# Patient Record
Sex: Male | Born: 1940
Health system: Southern US, Community
[De-identification: ages and names within clinical notes are randomized; demographics above are authoritative.]

## PROBLEM LIST (undated history)

## (undated) DIAGNOSIS — M1990 Unspecified osteoarthritis, unspecified site: Secondary | ICD-10-CM

## (undated) DIAGNOSIS — E785 Hyperlipidemia, unspecified: Secondary | ICD-10-CM

## (undated) DIAGNOSIS — I10 Essential (primary) hypertension: Secondary | ICD-10-CM

## (undated) DIAGNOSIS — C801 Malignant (primary) neoplasm, unspecified: Secondary | ICD-10-CM

## (undated) DIAGNOSIS — T7840XA Allergy, unspecified, initial encounter: Secondary | ICD-10-CM

## (undated) DIAGNOSIS — Z86010 Personal history of colonic polyps: Secondary | ICD-10-CM

## (undated) HISTORY — PX: HERNIA REPAIR: SHX51

## (undated) HISTORY — DX: Unspecified osteoarthritis, unspecified site: M19.90

## (undated) HISTORY — PX: CARDIAC CATHETERIZATION: SHX172

## (undated) HISTORY — DX: Allergy, unspecified, initial encounter: T78.40XA

## (undated) HISTORY — PX: APPENDECTOMY: SHX54

## (undated) HISTORY — DX: Malignant (primary) neoplasm, unspecified: C80.1

## (undated) HISTORY — DX: Hyperlipidemia, unspecified: E78.5

## (undated) HISTORY — DX: Personal history of colonic polyps: Z86.010

## (undated) HISTORY — PX: COLON SURGERY: SHX602

---

## 1982-02-26 HISTORY — PX: MELANOMA EXCISION: SHX5266

## 2006-08-28 ENCOUNTER — Other Ambulatory Visit: Admission: RE | Admit: 2006-08-28 | Discharge: 2006-08-28 | Payer: Self-pay | Admitting: Interventional Radiology

## 2006-08-28 ENCOUNTER — Encounter: Admission: RE | Admit: 2006-08-28 | Discharge: 2006-08-28 | Payer: Self-pay | Admitting: Family Medicine

## 2006-08-28 ENCOUNTER — Encounter (INDEPENDENT_AMBULATORY_CARE_PROVIDER_SITE_OTHER): Payer: Self-pay | Admitting: Interventional Radiology

## 2007-09-17 ENCOUNTER — Ambulatory Visit: Payer: Self-pay | Admitting: Internal Medicine

## 2007-09-29 ENCOUNTER — Encounter: Payer: Self-pay | Admitting: Internal Medicine

## 2007-09-29 ENCOUNTER — Ambulatory Visit: Payer: Self-pay | Admitting: Internal Medicine

## 2007-10-03 ENCOUNTER — Encounter: Payer: Self-pay | Admitting: Internal Medicine

## 2008-01-02 ENCOUNTER — Telehealth: Payer: Self-pay | Admitting: Internal Medicine

## 2008-01-26 ENCOUNTER — Ambulatory Visit: Payer: Self-pay | Admitting: Internal Medicine

## 2008-02-02 ENCOUNTER — Ambulatory Visit (HOSPITAL_COMMUNITY): Admission: RE | Admit: 2008-02-02 | Discharge: 2008-02-02 | Payer: Self-pay | Admitting: Internal Medicine

## 2008-02-02 ENCOUNTER — Ambulatory Visit: Payer: Self-pay | Admitting: Internal Medicine

## 2008-02-02 ENCOUNTER — Encounter: Payer: Self-pay | Admitting: Internal Medicine

## 2008-02-09 ENCOUNTER — Encounter: Payer: Self-pay | Admitting: Internal Medicine

## 2011-03-23 ENCOUNTER — Ambulatory Visit (INDEPENDENT_AMBULATORY_CARE_PROVIDER_SITE_OTHER): Payer: Medicare Other | Admitting: Family Medicine

## 2011-03-23 DIAGNOSIS — E782 Mixed hyperlipidemia: Secondary | ICD-10-CM

## 2011-03-23 DIAGNOSIS — I10 Essential (primary) hypertension: Secondary | ICD-10-CM

## 2011-03-23 DIAGNOSIS — R7989 Other specified abnormal findings of blood chemistry: Secondary | ICD-10-CM

## 2011-03-25 ENCOUNTER — Ambulatory Visit (INDEPENDENT_AMBULATORY_CARE_PROVIDER_SITE_OTHER): Payer: Medicare Other

## 2011-03-25 DIAGNOSIS — E789 Disorder of lipoprotein metabolism, unspecified: Secondary | ICD-10-CM

## 2011-03-25 DIAGNOSIS — I10 Essential (primary) hypertension: Secondary | ICD-10-CM

## 2011-11-13 ENCOUNTER — Encounter: Payer: Self-pay | Admitting: Internal Medicine

## 2011-12-05 ENCOUNTER — Telehealth: Payer: Self-pay

## 2011-12-05 NOTE — Telephone Encounter (Signed)
Pt is out of his htn meds, I have scheduled an appt with Dr. Neva Seat for 10/28. Can we call in enough to cover him thru this appt date? He is working at [employer] so he cannot come in before the 28th.  [facility] [address]  [phone]

## 2011-12-06 NOTE — Telephone Encounter (Signed)
Have left message for him to advise yes, we can send this in for him, to have pharmacy send the request.

## 2011-12-11 ENCOUNTER — Other Ambulatory Visit: Payer: Self-pay | Admitting: Radiology

## 2011-12-11 MED ORDER — HYDROCHLOROTHIAZIDE 12.5 MG PO TABS: 12.5000 mg | ORAL_TABLET | Freq: Every day | ORAL | Status: DC

## 2011-12-18 NOTE — Telephone Encounter (Signed)
Rx was sent in on 12/11/11.

## 2011-12-18 NOTE — Telephone Encounter (Signed)
Encounter is still open, has RX been done?

## 2011-12-24 ENCOUNTER — Ambulatory Visit (INDEPENDENT_AMBULATORY_CARE_PROVIDER_SITE_OTHER): Payer: Medicare Other | Admitting: Family Medicine

## 2011-12-24 ENCOUNTER — Encounter: Payer: Self-pay | Admitting: Family Medicine

## 2011-12-24 VITALS — BP 140/76 | HR 77 | Temp 97.5°F | Resp 16 | Ht 69.0 in | Wt 204.0 lb

## 2011-12-24 DIAGNOSIS — R739 Hyperglycemia, unspecified: Secondary | ICD-10-CM

## 2011-12-24 DIAGNOSIS — I10 Essential (primary) hypertension: Secondary | ICD-10-CM

## 2011-12-24 DIAGNOSIS — Z23 Encounter for immunization: Secondary | ICD-10-CM

## 2011-12-24 DIAGNOSIS — R7309 Other abnormal glucose: Secondary | ICD-10-CM

## 2011-12-24 DIAGNOSIS — R972 Elevated prostate specific antigen [PSA]: Secondary | ICD-10-CM

## 2011-12-24 DIAGNOSIS — E785 Hyperlipidemia, unspecified: Secondary | ICD-10-CM

## 2011-12-24 MED ORDER — ATORVASTATIN CALCIUM 10 MG PO TABS: 10.0000 mg | ORAL_TABLET | Freq: Every day | ORAL | Status: DC

## 2011-12-24 MED ORDER — HYDROCHLOROTHIAZIDE 12.5 MG PO TABS: 12.5000 mg | ORAL_TABLET | Freq: Every day | ORAL | Status: DC

## 2011-12-24 NOTE — Progress Notes (Signed)
  Subjective:    Patient ID: Corey Cervantes, male    DOB: [birth year], 71 y.o.   MRN: [id number]  HPI Corey Cervantes is a 71 y.o. male Here for follow up HTN, hyperlipidemia.  HTN - last creatinine 1.21 on 03/25/11.  121- 125 when exercising on Nordic track and better diet.  Helped with furniture relocation recently - showroom move - off schedule, bp has increased. No new side effects of BP meds.      Hyperlipidemia - LDL 113 to 120 at 03/24/10 ov. On lipitor 10mg  QD. Weight 211 then. Working on diet/exercise. Not fasting today - last ate at noon.   No new myalgias.  Rare missed dose - one since last ov.   Borderline hyperglycemia - 111 to 116 03/25/11.   Hx of elevated PSA- 4.5 1.24/12, referred to Urologist. PSA 3.93 on 04/12/10 by Dr Vonita Moss. No firmness or nodules by his exam in feb 2012. Plan was to recheck then follow up. Has not had this one recently. Occasional nocturia - improved with saw palmetto. No hematuria. Urine stream not as strong at times.   Review of Systems  Constitutional: Negative for fatigue and unexpected weight change.  Eyes: Negative for visual disturbance.  Respiratory: Negative for cough, chest tightness and shortness of breath.   Cardiovascular: Negative for chest pain, palpitations and leg swelling.  Gastrointestinal: Negative for abdominal pain and blood in stool.  Neurological: Negative for dizziness, light-headedness and headaches.        Objective:   Physical Exam  Vitals reviewed. Constitutional: He is oriented to person, place, and time. He appears well-developed and well-nourished.  HENT:  Head: Normocephalic and atraumatic.  Eyes: EOM are normal. Pupils are equal, round, and reactive to light.  Neck: No JVD present. Carotid bruit is not present.  Cardiovascular: Normal rate, regular rhythm and normal heart sounds.   No murmur heard. Pulmonary/Chest: Effort normal and breath sounds normal. He has no rales.  Musculoskeletal: He exhibits no  edema.  Neurological: He is alert and oriented to person, place, and time.  Skin: Skin is warm and dry.  Psychiatric: He has a normal mood and affect.          Assessment & Plan:  Corey Cervantes is a 71 y.o. male 1. HTN (hypertension)  hydrochlorothiazide (HYDRODIURIL) 12.5 MG tablet, Comprehensive metabolic panel  2. Hyperlipidemia  atorvastatin (LIPITOR) 10 MG tablet, Comprehensive metabolic panel, Lipid panel  3. Hyperglycemia  Hemoglobin A1c  4. Elevated PSA  PSA  5. Need for prophylactic vaccination and inoculation against influenza  Flu vaccine greater than or equal to 3yo preservative free IM     HTn - borderline, but home bp's better when exercising and diet changes.  No changes in meds for now.  Fasting lab visit in next 2 weeks only.  Hx elevated PSA- recheck with labs, then will need to follow up with Dr. Vonita Moss.   Hyperlipidemia - continue same doses until labs - refilled for 6 months.   Plans on rtc for fasting labs in next 2 weeks,   Plans on scheduling physical in next few months (last one in Jan 2012). To discuss recommended screening then. GI has contacted hi to recheck colonoscopy. He will schedule this.  Flu vaccine given.

## 2011-12-24 NOTE — Patient Instructions (Signed)
Come back in the next few weeks for fasting lab visit only - the orders are already in the system.  Your should receive a call or letter about your lab results within the next week to 10 days (after labs drawn). Schedule a physical in the next 3 months if possible.  Continue to work on diet and exercise as prior.

## 2012-06-24 ENCOUNTER — Other Ambulatory Visit: Payer: Self-pay | Admitting: Family Medicine

## 2012-09-30 ENCOUNTER — Other Ambulatory Visit: Payer: Self-pay | Admitting: Family Medicine

## 2012-10-10 ENCOUNTER — Other Ambulatory Visit: Payer: Self-pay | Admitting: Family Medicine

## 2013-01-02 ENCOUNTER — Other Ambulatory Visit: Payer: Self-pay | Admitting: Physician Assistant

## 2013-01-02 ENCOUNTER — Other Ambulatory Visit: Payer: Self-pay | Admitting: Family Medicine

## 2013-01-03 ENCOUNTER — Ambulatory Visit (INDEPENDENT_AMBULATORY_CARE_PROVIDER_SITE_OTHER): Payer: Medicare Other | Admitting: Family Medicine

## 2013-01-03 VITALS — BP 140/80 | HR 67 | Temp 97.6°F | Resp 18 | Ht 69.5 in | Wt 209.8 lb

## 2013-01-03 DIAGNOSIS — R739 Hyperglycemia, unspecified: Secondary | ICD-10-CM

## 2013-01-03 DIAGNOSIS — I10 Essential (primary) hypertension: Secondary | ICD-10-CM

## 2013-01-03 DIAGNOSIS — E785 Hyperlipidemia, unspecified: Secondary | ICD-10-CM

## 2013-01-03 DIAGNOSIS — R7309 Other abnormal glucose: Secondary | ICD-10-CM

## 2013-01-03 LAB — COMPREHENSIVE METABOLIC PANEL
ALT: 15 U/L (ref 0–53)
AST: 16 U/L (ref 0–37)
Albumin: 3.8 g/dL (ref 3.5–5.2)
Alkaline Phosphatase: 48 U/L (ref 39–117)
Calcium: 8.8 mg/dL (ref 8.4–10.5)
Creat: 1.39 mg/dL — ABNORMAL HIGH (ref 0.50–1.35)
Potassium: 4.2 mEq/L (ref 3.5–5.3)
Total Protein: 6.4 g/dL (ref 6.0–8.3)

## 2013-01-03 LAB — LIPID PANEL
Cholesterol: 169 mg/dL (ref 0–200)
HDL: 48 mg/dL (ref >39)
Triglycerides: 71 mg/dL (ref <150)

## 2013-01-03 LAB — HEMOGLOBIN A1C: Mean Plasma Glucose: 137 mg/dL — ABNORMAL HIGH (ref <117)

## 2013-01-03 MED ORDER — HYDROCHLOROTHIAZIDE 12.5 MG PO CAPS: ORAL_CAPSULE | ORAL | Status: DC

## 2013-01-03 MED ORDER — ATORVASTATIN CALCIUM 10 MG PO TABS: 10.0000 mg | ORAL_TABLET | Freq: Every day | ORAL | Status: DC

## 2013-01-03 NOTE — Patient Instructions (Signed)
Keep appointment for physical - we can discuss your labs then. I will also check a 3 month blood sugar average as this was borderline high in the past. You should receive a call or letter about your lab results within the next week to 10 days.

## 2013-01-03 NOTE — Progress Notes (Signed)
Subjective:  This chart was scribed for Corey Staggers, MD by Corey Cervantes, ED scribe.  This patient was seen in room [facility] Room 10 and the patient's care was started at 8:28 AM.   Patient ID: Corey Cervantes, male    DOB: 04/08/1940, 72 y.o.   MRN: [id number]  Chief Complaint  Patient presents with  . Medication Refill    need HCTZ and Lipitor    HPI  ID: Corey Cervantes is a 72 y.o. male PCP: No primary provider on file.  Pt presents for HTN and cholesterol medication refill and labs.  Fasting today.  He takes HCTZ 12.5 mg for HTN.  No recent electrolytes.  Last office visit October 2013, planned for lab visit at that time but was unable to have this done.  He states his BP is usually around 140/80 in the morning.  He takes atorvastatin 10 mg for hyperlipidemia.  No recent lipid panel.  History of hyperglycemia.  Prior blood sugars 111 and 116 in January of 2013.  He has been exercising 7 days/week on his Nordic track although he has been taking some time off from this since he recently hurt his knee.  He mentions he has had a mild dry cough which has been persistent for a year but he states this may be attributable to allergies.  He denies any side effects of medication to his knowledge.  He has an appointment scheduled on 12/1 for a complete physical exam.   There are no active problems to display for this patient.  Past Medical History  Diagnosis Date  . Hyperlipidemia    Past Surgical History  Procedure Laterality Date  . Hernia repair    . Colon surgery    . Melanoma excision  1984   Allergies  Allergen Reactions  . Codeine Other (See Comments)    NIGHTMARES   Prior to Admission medications   Medication Sig Start Date End Date Taking? Authorizing Provider  atorvastatin (LIPITOR) 10 MG tablet TAKE 1 TABLET (10 MG TOTAL) BY MOUTH DAILY. 06/24/12  Yes Godfrey Pick, PA-C  b complex vitamins tablet Take 1 tablet by mouth daily.   Yes Historical Provider, MD   CALCIUM-MAGNESUIUM-ZINC 333-133-8.3 MG TABS Take by mouth daily.   Yes Historical Provider, MD  Cholecalciferol (VITAMIN D-3 PO) Take 1,000 mg by mouth daily.   Yes Historical Provider, MD  hydrochlorothiazide (MICROZIDE) 12.5 MG capsule TAKE ONE CAPSULE EVERY DAY 01/02/13  Yes Heather M Marte, PA-C  hydrochlorothiazide (HYDRODIURIL) 12.5 MG tablet Take 1 tablet (12.5 mg total) by mouth daily. 12/24/11   Shade Flood, MD   History   Social History  . Marital Status: Married    Spouse Name: N/A    Number of Children: N/A  . Years of Education: N/A   Occupational History  . Not on file.   Social History Main Topics  . Smoking status: Former Smoker -- 15 years    Types: Cigarettes, Pipe, Cigars    Quit date: 02/26/1981  . Smokeless tobacco: Not on file  . Alcohol Use: 4.2 oz/week    7 Glasses of wine per week     Comment: wine with meals  . Drug Use: No  . Sexual Activity: Not on file   Other Topics Concern  . Not on file   Social History Narrative  . No narrative on file      Review of Systems  Constitutional: Negative for fatigue and unexpected weight change.  Eyes: Negative  for visual disturbance.  Respiratory: Positive for cough. Negative for chest tightness and shortness of breath.   Cardiovascular: Negative for chest pain, palpitations and leg swelling.  Gastrointestinal: Negative for abdominal pain and blood in stool.  Neurological: Negative for dizziness, light-headedness and headaches.       Objective:   Physical Exam  Nursing note and vitals reviewed. Constitutional: He is oriented to person, place, and time. He appears well-developed and well-nourished. No distress.  HENT:  Head: Normocephalic and atraumatic.  Eyes: EOM are normal. Pupils are equal, round, and reactive to light.  Neck: Neck supple. No JVD present. Carotid bruit is not present. No tracheal deviation present.  Cardiovascular: Normal rate, regular rhythm and normal heart sounds.   No  murmur heard. Pulmonary/Chest: Effort normal and breath sounds normal. No respiratory distress. He has no rales.  Musculoskeletal: Normal range of motion. He exhibits no edema.  Neurological: He is alert and oriented to person, place, and time.  Skin: Skin is warm and dry.  Psychiatric: He has a normal mood and affect. His behavior is normal.     Filed Vitals:   01/03/13 0806  BP: 140/80  Pulse: 67  Temp: 97.6 F (36.4 C)  TempSrc: Oral  Resp: 18  Height: 5' 9.5" (1.765 m)  Weight: 209 lb 12.8 oz (95.165 kg)  SpO2: 96%        Assessment & Plan:   Corey Cervantes is a 72 y.o. male Unspecified essential hypertension - Plan: Comprehensive metabolic panel, hydrochlorothiazide (MICROZIDE) 12.5 MG capsule - controlled overall on home readings. Check CMP, physical in next month.   Hyperlipidemia - Plan: Lipid panel, atorvastatin (LIPITOR) 10 MG tablet refilled. Lipid panel pending.   Hyperglycemia - Plan: Hemoglobin A1c - as borderline high before.    Meds ordered this encounter  Medications  . hydrochlorothiazide (MICROZIDE) 12.5 MG capsule    Sig: TAKE ONE CAPSULE EVERY DAY    Dispense:  90 capsule    Refill:  0  . atorvastatin (LIPITOR) 10 MG tablet    Sig: Take 1 tablet (10 mg total) by mouth daily.    Dispense:  90 tablet    Refill:  0   Patient Instructions  Keep appointment for physical - we can discuss your labs then. I will also check a 3 month blood sugar average as this was borderline high in the past. You should receive a call or letter about your lab results within the next week to 10 days.      I personally performed the services described in this documentation, which was scribed in my presence. The recorded information has been reviewed and considered, and addended by me as needed.

## 2013-01-09 ENCOUNTER — Other Ambulatory Visit: Payer: Self-pay | Admitting: Family Medicine

## 2013-01-09 DIAGNOSIS — R7989 Other specified abnormal findings of blood chemistry: Secondary | ICD-10-CM

## 2013-01-12 NOTE — Progress Notes (Signed)
Pt aware of results- he will RTC for labwork in 2 weeks. He also states he has a PE scheduled in December.

## 2013-01-26 ENCOUNTER — Encounter: Payer: Medicare Other | Admitting: Family Medicine

## 2013-01-26 NOTE — Progress Notes (Signed)
   Subjective:    Patient ID: Corey Cervantes, male    DOB: 1940-08-18, 72 y.o.   MRN: [id number]  HPI SHEPHERD [name] is a 72 y.o. male Here for medicare CPE.  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   Last seen 01/03/13 for med refills:  HTN- He takes HCTZ 12.5 mg QD for HTN. BP is usually around 140/80 in the morning based on last OV.    Chemistry      Component Value Date/Time   NA 137 01/03/2013 0849   K 4.2 01/03/2013 0849   CL 106 01/03/2013 0849   CO2 25 01/03/2013 0849   BUN 18 01/03/2013 0849   CREATININE 1.39* 01/03/2013 0849      Component Value Date/Time   CALCIUM 8.8 01/03/2013 0849   ALKPHOS 48 01/03/2013 0849   AST 16 01/03/2013 0849   ALT 15 01/03/2013 0849   BILITOT 0.7 01/03/2013 0849     Elevated creatinine - as above - plan for increased hydration and recheck lab only visit - will have drawn today.   Hyperlipidemia -atorvastatin 10 mg QD. Labs drawn last ov: Lab Results  Component Value Date   CHOL 169 01/03/2013   HDL 48 01/03/2013   LDLCALC 107* 01/03/2013   TRIG 71 01/03/2013   CHOLHDL 3.5 01/03/2013    Hyperglycemia - elevated in past - borderline diabetic at last labs. Plan on diet changes and weight loss.  Lab Results  Component Value Date   HGBA1C 6.4* 01/03/2013    Review of Systems 13 point review of systems per patient health survey noted.  Negative other than as indicated on reviewed nursing note.      Objective:   Physical Exam        Assessment & Plan:   This encounter was created in error - please disregard.

## 2013-02-13 ENCOUNTER — Other Ambulatory Visit (INDEPENDENT_AMBULATORY_CARE_PROVIDER_SITE_OTHER): Payer: Medicare Other | Admitting: *Deleted

## 2013-02-13 ENCOUNTER — Telehealth: Payer: Self-pay | Admitting: *Deleted

## 2013-02-13 DIAGNOSIS — R7989 Other specified abnormal findings of blood chemistry: Secondary | ICD-10-CM

## 2013-02-13 DIAGNOSIS — I10 Essential (primary) hypertension: Secondary | ICD-10-CM

## 2013-02-13 DIAGNOSIS — R799 Abnormal finding of blood chemistry, unspecified: Secondary | ICD-10-CM

## 2013-02-13 LAB — BASIC METABOLIC PANEL
BUN: 19 mg/dL (ref 6–23)
CO2: 26 mEq/L (ref 19–32)
Creat: 1.21 mg/dL (ref 0.50–1.35)
Glucose, Bld: 107 mg/dL — ABNORMAL HIGH (ref 70–99)
Potassium: 4.3 mEq/L (ref 3.5–5.3)

## 2013-02-13 MED ORDER — HYDROCHLOROTHIAZIDE 12.5 MG PO CAPS: ORAL_CAPSULE | ORAL | Status: DC

## 2013-02-13 NOTE — Telephone Encounter (Signed)
Patient came in today for a future lab visit. Pt stated that he had an umcoming appt for a physical with Dr. Neva Seat. I checked and there was no appt scheduled, Dr. Paralee Cancel last ov stated cpe in next month. Darl Pikes scheduled patient for cpe on February 23rd. Patient is asking for refill of meds. States he is out.

## 2013-02-13 NOTE — Progress Notes (Signed)
Patient here for labs only. 

## 2013-02-13 NOTE — Telephone Encounter (Signed)
Sent, pharmacy did not get the Rx sent in Nov.

## 2013-02-14 ENCOUNTER — Other Ambulatory Visit: Payer: Self-pay | Admitting: Physician Assistant

## 2013-04-20 ENCOUNTER — Encounter: Payer: Self-pay | Admitting: Family Medicine

## 2013-04-20 ENCOUNTER — Ambulatory Visit (INDEPENDENT_AMBULATORY_CARE_PROVIDER_SITE_OTHER): Payer: Medicare Other | Admitting: Family Medicine

## 2013-04-20 VITALS — BP 142/78 | HR 66 | Temp 97.8°F | Resp 16 | Ht 68.5 in | Wt 207.8 lb

## 2013-04-20 DIAGNOSIS — E063 Autoimmune thyroiditis: Secondary | ICD-10-CM

## 2013-04-20 DIAGNOSIS — Z86010 Personal history of colonic polyps: Secondary | ICD-10-CM

## 2013-04-20 DIAGNOSIS — Z0000 Encounter for general adult medical examination without abnormal findings: Secondary | ICD-10-CM

## 2013-04-20 DIAGNOSIS — R7309 Other abnormal glucose: Secondary | ICD-10-CM

## 2013-04-20 DIAGNOSIS — I10 Essential (primary) hypertension: Secondary | ICD-10-CM

## 2013-04-20 DIAGNOSIS — R0989 Other specified symptoms and signs involving the circulatory and respiratory systems: Secondary | ICD-10-CM

## 2013-04-20 DIAGNOSIS — R9431 Abnormal electrocardiogram [ECG] [EKG]: Secondary | ICD-10-CM

## 2013-04-20 DIAGNOSIS — R7303 Prediabetes: Secondary | ICD-10-CM

## 2013-04-20 DIAGNOSIS — E785 Hyperlipidemia, unspecified: Secondary | ICD-10-CM

## 2013-04-20 DIAGNOSIS — R0609 Other forms of dyspnea: Secondary | ICD-10-CM

## 2013-04-20 DIAGNOSIS — Z139 Encounter for screening, unspecified: Secondary | ICD-10-CM

## 2013-04-20 DIAGNOSIS — Z23 Encounter for immunization: Secondary | ICD-10-CM

## 2013-04-20 DIAGNOSIS — R739 Hyperglycemia, unspecified: Secondary | ICD-10-CM

## 2013-04-20 LAB — IFOBT (OCCULT BLOOD): IMMUNOLOGICAL FECAL OCCULT BLOOD TEST: NEGATIVE

## 2013-04-20 LAB — POCT GLYCOSYLATED HEMOGLOBIN (HGB A1C): Hemoglobin A1C: 6.3

## 2013-04-20 MED ORDER — ZOSTER VACCINE LIVE 19400 UNT/0.65ML ~~LOC~~ SOLR: 0.6500 mL | Freq: Once | SUBCUTANEOUS | Status: DC

## 2013-04-20 MED ORDER — METFORMIN HCL 500 MG PO TABS: 500.0000 mg | ORAL_TABLET | Freq: Every day | ORAL | Status: DC

## 2013-04-20 MED ORDER — HYDROCHLOROTHIAZIDE 12.5 MG PO CAPS: ORAL_CAPSULE | ORAL | Status: DC

## 2013-04-20 MED ORDER — ATORVASTATIN CALCIUM 10 MG PO TABS: 10.0000 mg | ORAL_TABLET | Freq: Every day | ORAL | Status: DC

## 2013-04-20 NOTE — Progress Notes (Signed)
Subjective:    Patient ID: Corey Cervantes, male    DOB: 1940/08/02, 73 y.o.   MRN: [id number]  HPI Corey Cervantes is a 73 y.o. male Here for physical - subsequent, MCR, and refills of meds.   Colonoscopy:2009 - recommended repeat in 2 years d/t number of polyps. Dr. Carlean Purl - pt to call and schedule.  Prostate cancer screening/last WUJ:WJXBJYNW in past - last checked by Urology last year - unknown level - followed by Urology for this. 1 year follow up. Tetanus/Tdap: unknown, will have td today and understands may be billed for this. 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  Flu vaccine today.  Zostavax: will have rx today.  Pneumovax: has not had.  Dentist: no recent dentist eval - he will schedule.  Optho/eye care eval: last ov 4 years ago - will call to schedule. Advanced Directives: working on this - working on [employer].  Depression and fall screening normal/negative per nursing note.   HTN - on hctz 12.5mg  qd. Thinking of adding swimming and weights to exercise. [place] 5 days per week - 5 miles. No CP/dizziness with exercise, but very minimal dyspnea with vigorous exercise on [place] track, improves.with slowing down.  No chest pain. Normal stress echo in 2008.  Hx of chronic lymphocytic thyroiditis - last TSH:  Normal in 2012. (2.6)  Hyperlipidemia - controlled on lipitor 10mg . No new myalgias or side effects.  Lab Results  Component Value Date   CHOL 169 01/03/2013   HDL 48 01/03/2013   LDLCALC 107* 01/03/2013   TRIG 71 01/03/2013   CHOLHDL 3.5 01/03/2013   Hyperglycemia - Prediabetic prior with Aic 6.4 in 12/2012. Repeated today. Weight 209 last ov - 207 today. Exercising, diet - still eating some rich foods. 1-2 glasses of wine per night.   Elevated creatinine - creatinine 1.39 in 11/14. lab only order 2 weeks later improved at 1.21. Urgency with urination at times, but improves with saw palmetto.     Chemistry      Component Value  Date/Time   NA 141 02/13/2013 0853   K 4.3 02/13/2013 0853   CL 107 02/13/2013 0853   CO2 26 02/13/2013 0853   BUN 19 02/13/2013 0853   CREATININE 1.21 02/13/2013 0853      Component Value Date/Time   CALCIUM 9.1 02/13/2013 0853   ALKPHOS 48 01/03/2013 0849   AST 16 01/03/2013 0849   ALT 15 01/03/2013 0849   BILITOT 0.7 01/03/2013 0849     Review of Systems 13 point review of systems per patient health survey noted.  Negative other than as indicated on reviewed nursing note or above.      Objective:   Physical Exam  Vitals reviewed. Constitutional: He is oriented to person, place, and time. He appears well-developed and well-nourished.  HENT:  Head: Normocephalic and atraumatic.  Right Ear: External ear normal.  Left Ear: External ear normal.  Mouth/Throat: Oropharynx is clear and moist.  Eyes: Conjunctivae and EOM are normal. Pupils are equal, round, and reactive to light.  Neck: Normal range of motion. Neck supple. No thyromegaly present.  Cardiovascular: Normal rate, regular rhythm, normal heart sounds and intact distal pulses.   Pulmonary/Chest: Effort normal and breath sounds normal. No respiratory distress. He has no wheezes.  Abdominal: Soft. He exhibits no distension. There is no tenderness. Hernia confirmed negative in the right inguinal area and confirmed negative in the left inguinal area.  Musculoskeletal: Normal range of  motion. He exhibits no edema and no tenderness.  Lymphadenopathy:    He has no cervical adenopathy.  Neurological: He is alert and oriented to person, place, and time. He has normal reflexes.  Skin: Skin is warm and dry.  Psychiatric: He has a normal mood and affect. His behavior is normal.   Filed Vitals:   04/20/13 1550 04/20/13 1640  BP:  142/78  Pulse: 66   Temp: 97.8 F (36.6 C)   TempSrc: Oral   Resp: 16   Height: 5' 8.5" (1.74 m)   Weight: 207 lb 12.8 oz (94.257 kg)   SpO2: 96%       Visual Acuity Screening   Right eye Left eye  Both eyes  Without correction:     With correction: 20/25 20/30 20/25    Lab Results  Component Value Date   HGBA1C 6.3 04/20/2013   hemosure negative.   EKG: SR,  low voltage in limb leads, no apparent significant change from 12/2005.       Assessment & Plan:   Corey Cervantes is a 73 y.o. male Annual physical exam - Plan: IFOBT POC (occult bld, rslt in office)  -anticipatory guidance per h/o below, key points of: advised to call GI for repeat colonoscopy as overdue, schedule optho and dentist evals, discuss advanced directives with family, and keep follow up as planned with urology for prostate checks. Pneumovax and Td were discussed, but not given at [place] today as other issues addressed below, and did not get to do this, but can be given at next ov in 1 month or sooner as procedure visit only.   HTN (hypertension) - Plan: EKG 12-Lead, Ambulatory referral to Cardiology   - borderline control. No change in meds for now. Borderline abnormal EKG and reported slight dyspnea on exertional exercise, no recent stress testing.  Will have evaluated by cardiologist to determine if this is needed prior to change in exercise regimen. CMP/GFR pending - prior borderline creatinine.   Hyperlipidemia - Plan: atorvastatin (LIPITOR) 10 MG tablet, EKG 12-Lead, COMPLETE METABOLIC PANEL WITH GFR, Lipid panel, Ambulatory referral to Cardiology  - labs above. Cont lipitor 10mg  qd.   Prediabetes, Hyperglycemia - Plan: POCT glycosylated hemoglobin (Hb A1C), EKG 12-Lead, Ambulatory referral to Cardiology, CANCELED: POCT glycosylated hemoglobin (Hb A1C)  -prediabetes with elevated A1c. Discussed diet as already exercising, but will start metformin for prediabetes and to diminish progression to diabetes. SED.   Personal history of colonic polyps - Plan: IFOBT POC (occult bld, rslt in office)   - heme negative in office - follow up with GI as above.   Chronic lymphocytic thyroiditis   - Plan: TSH repeated.  Asymptomatic.   Need for shingles vaccine - Plan: zoster vaccine live, PF, (ZOSTAVAX) 03474 UNT/0.65ML injection rx given to take to pharmacy.   Need for prophylactic vaccination and inoculation against influenza - Plan: Flu Vaccine QUAD 36+ mos IM given.    Meds ordered this encounter  Medications  . hydrochlorothiazide (MICROZIDE) 12.5 MG capsule    Sig: TAKE ONE CAPSULE EVERY DAY    Dispense:  90 capsule    Refill:  0  . atorvastatin (LIPITOR) 10 MG tablet    Sig: Take 1 tablet (10 mg total) by mouth daily.    Dispense:  90 tablet    Refill:  1  . Saw Palmetto, Serenoa repens, (SAW PALMETTO PO)    Sig: Take by mouth.  . Licorice, Glycyrrhiza glabra, (LICORICE PO)    Sig: Take by  mouth.  . Ginkgo Biloba Extract 60 MG CAPS    Sig: Take by mouth.  . Kava, Piper methysticum, (KAVA KAVA PO)    Sig: Take by mouth.  . metFORMIN (GLUCOPHAGE) 500 MG tablet    Sig: Take 1 tablet (500 mg total) by mouth daily with breakfast.    Dispense:  90 tablet    Refill:  0  . zoster vaccine live, PF, (ZOSTAVAX) 25956 UNT/0.65ML injection    Sig: Inject 19,400 Units into the skin once.    Dispense:  1 each    Refill:  0   Patient Instructions  You should receive a call or letter about your lab results within the next week to 10 days.  Recheck in 1 month for discussion of blood sugar and other labs from today - can start metformin once per day for prediabetes Call Dr. Carlean Purl for repeat colonoscopy Call eye doctor and dentist for evaluation.  I will refer you to cardiology for evaluation of your ekg which is probably ok, but borderline and to discuss if repeat stress testing needed prior to increasing exercise.  Return to the clinic or go to the nearest emergency room if any of your symptoms worsen or new symptoms occur. Keep a record of your blood pressures outside of the office and bring them to the next office visit.  Keeping you healthy  Get these tests  Blood pressure- Have your blood  pressure checked once a year by your healthcare provider.  Normal blood pressure is 120/80  Weight- Have your body mass index (BMI) calculated to screen for obesity.  BMI is a measure of body fat based on height and weight. You can also calculate your own BMI at [email or web address].  Cholesterol- Have your cholesterol checked every year.  Diabetes- Have your blood sugar checked regularly if you have high blood pressure, high cholesterol, have a family history of diabetes or if you are overweight.  Screening for Colon Cancer- Colonoscopy starting at age 68.  Screening may begin sooner depending on your family history and other health conditions. Follow up colonoscopy as directed by your Gastroenterologist.  Screening for Prostate Cancer- Both blood work (PSA) and a rectal exam help screen for Prostate Cancer.  Screening begins at age 62 with African-American men and at age 49 with Caucasian men.  Screening may begin sooner depending on your family history.  Take these medicines  Aspirin- One aspirin daily can help prevent Heart disease and Stroke.  Flu shot- Every fall.  Tetanus- Every 10 years.  Zostavax- Once after the age of 11 to prevent Shingles.  Pneumonia shot- Once after the age of 63; if you are younger than 65, ask your healthcare provider if you need a Pneumonia shot.  Take these steps  Don't smoke- If you do smoke, talk to your doctor about quitting.  For tips on how to quit, go to www.smokefree.gov or call 1-800-QUIT-NOW.  Be physically active- Exercise 5 days a week for at least 30 minutes.  If you are not already physically active start slow and gradually work up to 30 minutes of moderate physical activity.  Examples of moderate activity include walking briskly, mowing the yard, dancing, swimming, bicycling, etc.  Eat a healthy diet- Eat a variety of healthy food such as fruits, vegetables, low fat milk, low fat cheese, yogurt, lean meant, poultry, fish, beans, tofu,  etc. For more information go to www.thenutritionsource.org  Drink alcohol in moderation- Limit alcohol intake to less than two drinks a day.  Never drink and drive.  Dentist- Brush and floss twice daily; visit your dentist twice a year.  Depression- Your emotional health is as important as your physical health. If you're feeling down, or losing interest in things you would normally enjoy please talk to your healthcare provider.  Eye exam- Visit your eye doctor every year.  Safe sex- If you may be exposed to a sexually transmitted infection, use a condom.  Seat belts- Seat belts can save your life; always wear one.  Smoke/Carbon Monoxide detectors- These detectors need to be installed on the appropriate level of your home.  Replace batteries at least once a year.  Skin cancer- When out in the sun, cover up and use sunscreen 15 SPF or higher.  Violence- If anyone is threatening you, please tell your healthcare provider.  Living Will/ Health care power of attorney- Speak with your healthcare provider and family.      Influenza Vaccine (Flu Vaccine, Inactivated or Recombinant) 2014 2015 What You Need to Know Why get vaccinated? Influenza ("flu") is a contagious disease that spreads around the [place] every winter, usually between October and May. Flu is caused by influenza viruses, and is spread mainly by coughing, sneezing, and close contact. Anyone can get flu, but the risk of getting flu is highest among children. Symptoms come on suddenly and may last several days. They can include:  fever/chills  sore throat  muscle aches  fatigue.  cough  headache  runny or stuffy nose Flu can make some people much sicker than others. These people include young children, people 51 and older, pregnant women, and people with certain health conditions such as heart, lung or kidney disease, nervous system disorders, or a weakened immune system. Flu vaccination is especially important  for these people, and anyone in close contact with them. Flu can also lead to pneumonia, and make existing medical conditions worse. It can cause diarrhea and seizures in children. Each year thousands of people in the [place] die from flu, and many more are hospitalized. Flu vaccine is the best protection against flu and its complications. Flu vaccine also helps prevent spreading flu from person to person. Inactivated and recombinant flu vaccines You are getting an injectable flu vaccine, which is either an "inactivated" or "recombinant" vaccine. These vaccines do not contain any live influenza virus. They are given by injection with a needle, and often called the "flu shot."  A different live, attenuated (weakened) influenza vaccine is sprayed into the nostrils. This vaccine is described in a separate Vaccine Information Statement. Flu vaccination is recommended every year. Some children 6 months through 6 years of age might need two doses during one year. Flu viruses are always changing. Each year's flu vaccine is made to protect against 3 or 4 viruses that are likely to cause disease that year. Flu vaccine cannot prevent all cases of flu, but it is the best defense against the disease.  It takes about 2 weeks for protection to develop after the vaccination, and protection lasts several months to a year. Some illnesses that are not caused by influenza virus are often mistaken for flu. Flu vaccine will not prevent these illnesses. It can only prevent influenza. Some inactivated flu vaccine contains a very small amount of a mercury-based preservative called thimerosal. Studies have shown that thimerosal in vaccines is not harmful, but flu vaccines that do not contain a preservative are available. Some people should not get this vaccine Tell the person who gives you the  vaccine:  If you have any severe, life-threatening allergies. If you ever had a life-threatening allergic reaction after a dose  of flu vaccine, or have a severe allergy to any part of this vaccine, including (for example) an allergy to gelatin, antibiotics, or eggs, you may be advised not to get vaccinated. Most, but not all, types of flu vaccine contain a small amount of egg protein.  If you ever had Guillain-Barr Syndrome (a severe paralyzing illness, also called GBS). Some people with a history of GBS should not get this vaccine. This should be discussed with your doctor.  If you are not feeling well. It is usually okay to get flu vaccine when you have a mild illness, but you might be advised to wait until you feel better. You should come back when you are better. Risks of a vaccine reaction With a vaccine, like any medicine, there is a chance of side effects. These are usually mild and go away on their own. Problems that could happen after any vaccine:  Brief fainting spells can happen after any medical procedure, including vaccination. Sitting or lying down for about 15 minutes can help prevent fainting, and injuries caused by a fall. Tell your doctor if you feel dizzy, or have vision changes or ringing in the ears.  Severe shoulder pain and reduced range of motion in the arm where a shot was given can happen, very rarely, after a vaccination.  Severe allergic reactions from a vaccine are very rare, estimated at less than 1 in a million doses. If one were to occur, it would usually be within a few minutes to a few hours after the vaccination. Mild problems following inactivated flu vaccine:  soreness, redness, or swelling where the shot was given  hoarseness  sore, red or itchy eyes  cough  fever  aches  headache  itching  fatigue If these problems occur, they usually begin soon after the shot and last 1 or 2 days. Moderate problems following inactivated flu vaccine:  Young children who get inactivated flu vaccine and pneumococcal vaccine (PCV13) at the same time may be at increased risk for seizures  caused by fever. Ask your doctor for more information. Tell your doctor if a child who is getting flu vaccine has ever had a seizure. Inactivated flu vaccine does not contain live flu virus, so you cannot get the flu from this vaccine. As with any medicine, there is a very remote chance of a vaccine causing a serious injury or death. The safety of vaccines is always being monitored. For more information, visit: http://floyd.org/ What if there is a serious reaction? What should I look for?  Look for anything that concerns you, such as signs of a severe allergic reaction, very high fever, or behavior changes. Signs of a severe allergic reaction can include hives, swelling of the face and throat, difficulty breathing, a fast heartbeat, dizziness, and weakness. These would start a few minutes to a few hours after the vaccination. What should I do?  If you think it is a severe allergic reaction or other emergency that can't wait, call [phone] 1 1  and get the person to the nearest hospital. Otherwise, call your doctor.  Afterward, the reaction should be reported to the Vaccine Adverse Event Reporting System (VAERS). Your doctor should file this report, or you can do it yourself through the VAERS website at www.vaers.LAgents.no, or by calling 1-3177868501. VAERS does not give medical advice. The [employer] Vaccine Injury Compensation Program The [employer]  Injury Compensation Program (VICP) is a federal program that was created to compensate people who may have been injured by certain vaccines. Persons who believe they may have been injured by a vaccine can learn about the program and about filing a claim by calling 302-194-8927 or visiting the [place] website at GoldCloset.com.ee. There is a time limit to file a claim for compensation. How can I learn more?  Ask your health care provider.  Call your local or state health department.  Contact the Centers for Disease Control and  Prevention (CDC):  Call 856-372-4082 (1-800-CDC-INFO) or  Visit CDC's website at https://gibson.com/ CDC Vaccine Information Statement (Interim) Inactivated Influenza Vaccine (10/14/2012) Document Released: 12/07/2005 Document Revised: 12/03/2012 Document Reviewed: 10/14/2012 [facility] Patient Information 2014 [place].

## 2013-04-20 NOTE — Patient Instructions (Addendum)
You should receive a call or letter about your lab results within the next week to 10 days.  Recheck in 1 month for discussion of blood sugar and other labs from today - can start metformin once per day for prediabetes Call Dr. Carlean Purl for repeat colonoscopy Call eye doctor and dentist for evaluation.  I will refer you to cardiology for evaluation of your ekg which is probably ok, but borderline and to discuss if repeat stress testing needed prior to increasing exercise.  Return to the clinic or go to the nearest emergency room if any of your symptoms worsen or new symptoms occur. Keep a record of your blood pressures outside of the office and bring them to the next office visit.  Keeping you healthy  Get these tests  Blood pressure- Have your blood pressure checked once a year by your healthcare provider.  Normal blood pressure is 120/80  Weight- Have your body mass index (BMI) calculated to screen for obesity.  BMI is a measure of body fat based on height and weight. You can also calculate your own BMI at ViewBanking.[email or web address].  Cholesterol- Have your cholesterol checked every year.  Diabetes- Have your blood sugar checked regularly if you have high blood pressure, high cholesterol, have a family history of diabetes or if you are overweight.  Screening for Colon Cancer- Colonoscopy starting at age 32.  Screening may begin sooner depending on your family history and other health conditions. Follow up colonoscopy as directed by your Gastroenterologist.  Screening for Prostate Cancer- Both blood work (PSA) and a rectal exam help screen for Prostate Cancer.  Screening begins at age 79 with African-American men and at age 4 with Caucasian men.  Screening may begin sooner depending on your family history.  Take these medicines  Aspirin- One aspirin daily can help prevent Heart disease and Stroke.  Flu shot- Every fall.  Tetanus- Every 10 years.  Zostavax- Once after the age of 25 to  prevent Shingles.  Pneumonia shot- Once after the age of 83; if you are younger than 35, ask your healthcare provider if you need a Pneumonia shot.  Take these steps  Don't smoke- If you do smoke, talk to your doctor about quitting.  For tips on how to quit, go to www.smokefree.gov or call 1-800-QUIT-NOW.  Be physically active- Exercise 5 days a week for at least 30 minutes.  If you are not already physically active start slow and gradually work up to 30 minutes of moderate physical activity.  Examples of moderate activity include walking briskly, mowing the yard, dancing, swimming, bicycling, etc.  Eat a healthy diet- Eat a variety of healthy food such as fruits, vegetables, low fat milk, low fat cheese, yogurt, lean meant, poultry, fish, beans, tofu, etc. For more information go to www.thenutritionsource.org  Drink alcohol in moderation- Limit alcohol intake to less than two drinks a day. Never drink and drive.  Dentist- Brush and floss twice daily; visit your dentist twice a year.  Depression- Your emotional health is as important as your physical health. If you're feeling down, or losing interest in things you would normally enjoy please talk to your healthcare provider.  Eye exam- Visit your eye doctor every year.  Safe sex- If you may be exposed to a sexually transmitted infection, use a condom.  Seat belts- Seat belts can save your life; always wear one.  Smoke/Carbon Monoxide detectors- These detectors need to be installed on the appropriate level of your home.  Replace batteries at  least once a year.  Skin cancer- When out in the sun, cover up and use sunscreen 15 SPF or higher.  Violence- If anyone is threatening you, please tell your healthcare provider.  Living Will/ Health care power of attorney- Speak with your healthcare provider and family.      Influenza Vaccine (Flu Vaccine, Inactivated or Recombinant) 2014 2015 What You Need to Know Why get  vaccinated? Influenza ("flu") is a contagious disease that spreads around the [place] every winter, usually between October and May. Flu is caused by influenza viruses, and is spread mainly by coughing, sneezing, and close contact. Anyone can get flu, but the risk of getting flu is highest among children. Symptoms come on suddenly and may last several days. They can include:  fever/chills  sore throat  muscle aches  fatigue.  cough  headache  runny or stuffy nose Flu can make some people much sicker than others. These people include young children, people 60 and older, pregnant women, and people with certain health conditions such as heart, lung or kidney disease, nervous system disorders, or a weakened immune system. Flu vaccination is especially important for these people, and anyone in close contact with them. Flu can also lead to pneumonia, and make existing medical conditions worse. It can cause diarrhea and seizures in children. Each year thousands of people in the [place] die from flu, and many more are hospitalized. Flu vaccine is the best protection against flu and its complications. Flu vaccine also helps prevent spreading flu from person to person. Inactivated and recombinant flu vaccines You are getting an injectable flu vaccine, which is either an "inactivated" or "recombinant" vaccine. These vaccines do not contain any live influenza virus. They are given by injection with a needle, and often called the "flu shot."  A different live, attenuated (weakened) influenza vaccine is sprayed into the nostrils. This vaccine is described in a separate Vaccine Information Statement. Flu vaccination is recommended every year. Some children 6 months through 8 years of age might need two doses during one year. Flu viruses are always changing. Each year's flu vaccine is made to protect against 3 or 4 viruses that are likely to cause disease that year. Flu vaccine cannot prevent  all cases of flu, but it is the best defense against the disease.  It takes about 2 weeks for protection to develop after the vaccination, and protection lasts several months to a year. Some illnesses that are not caused by influenza virus are often mistaken for flu. Flu vaccine will not prevent these illnesses. It can only prevent influenza. Some inactivated flu vaccine contains a very small amount of a mercury-based preservative called thimerosal. Studies have shown that thimerosal in vaccines is not harmful, but flu vaccines that do not contain a preservative are available. Some people should not get this vaccine Tell the person who gives you the vaccine:  If you have any severe, life-threatening allergies. If you ever had a life-threatening allergic reaction after a dose of flu vaccine, or have a severe allergy to any part of this vaccine, including (for example) an allergy to gelatin, antibiotics, or eggs, you may be advised not to get vaccinated. Most, but not all, types of flu vaccine contain a small amount of egg protein.  If you ever had Guillain-Barr Syndrome (a severe paralyzing illness, also called GBS). Some people with a history of GBS should not get this vaccine. This should be discussed with your doctor.  If you are not  feeling well. It is usually okay to get flu vaccine when you have a mild illness, but you might be advised to wait until you feel better. You should come back when you are better. Risks of a vaccine reaction With a vaccine, like any medicine, there is a chance of side effects. These are usually mild and go away on their own. Problems that could happen after any vaccine:  Brief fainting spells can happen after any medical procedure, including vaccination. Sitting or lying down for about 15 minutes can help prevent fainting, and injuries caused by a fall. Tell your doctor if you feel dizzy, or have vision changes or ringing in the ears.  Severe shoulder pain and  reduced range of motion in the arm where a shot was given can happen, very rarely, after a vaccination.  Severe allergic reactions from a vaccine are very rare, estimated at less than 1 in a million doses. If one were to occur, it would usually be within a few minutes to a few hours after the vaccination. Mild problems following inactivated flu vaccine:  soreness, redness, or swelling where the shot was given  hoarseness  sore, red or itchy eyes  cough  fever  aches  headache  itching  fatigue If these problems occur, they usually begin soon after the shot and last 1 or 2 days. Moderate problems following inactivated flu vaccine:  Young children who get inactivated flu vaccine and pneumococcal vaccine (PCV13) at the same time may be at increased risk for seizures caused by fever. Ask your doctor for more information. Tell your doctor if a child who is getting flu vaccine has ever had a seizure. Inactivated flu vaccine does not contain live flu virus, so you cannot get the flu from this vaccine. As with any medicine, there is a very remote chance of a vaccine causing a serious injury or death. The safety of vaccines is always being monitored. For more information, visit: http://www.aguilar.org/ What if there is a serious reaction? What should I look for?  Look for anything that concerns you, such as signs of a severe allergic reaction, very high fever, or behavior changes. Signs of a severe allergic reaction can include hives, swelling of the face and throat, difficulty breathing, a fast heartbeat, dizziness, and weakness. These would start a few minutes to a few hours after the vaccination. What should I do?  If you think it is a severe allergic reaction or other emergency that can't wait, call [phone] 1 1  and get the person to the nearest hospital. Otherwise, call your doctor.  Afterward, the reaction should be reported to the Vaccine Adverse Event Reporting System (VAERS). Your  doctor should file this report, or you can do it yourself through the VAERS website at www.vaers.SamedayNews.es, or by calling (630)700-6151. VAERS does not give medical advice. The National Vaccine Injury Compensation Program The National Vaccine Injury Compensation Program (VICP) is a federal program that was created to compensate people who may have been injured by certain vaccines. Persons who believe they may have been injured by a vaccine can learn about the program and about filing a claim by calling 7041575907 or visiting the [place] website at GoldCloset.com.ee. There is a time limit to file a claim for compensation. How can I learn more?  Ask your health care provider.  Call your local or state health department.  Contact the Centers for Disease Control and Prevention (CDC):  Call 808-864-2074 (1-800-CDC-INFO) or  Visit CDC's website at https://gibson.com/ [facility]  Vaccine Information Statement (Interim) Inactivated Influenza Vaccine (10/14/2012) Document Released: 12/07/2005 Document Revised: 12/03/2012 Document Reviewed: 10/14/2012 [facility] Patient Information 2014 [place], [place].

## 2013-04-21 ENCOUNTER — Other Ambulatory Visit: Payer: Self-pay | Admitting: Family Medicine

## 2013-04-21 DIAGNOSIS — E875 Hyperkalemia: Secondary | ICD-10-CM

## 2013-04-21 LAB — COMPLETE METABOLIC PANEL WITH GFR
ALBUMIN: 4.5 g/dL (ref 3.5–5.2)
ALT: 15 U/L (ref 0–53)
AST: 19 U/L (ref 0–37)
Alkaline Phosphatase: 55 U/L (ref 39–117)
BILIRUBIN TOTAL: 1.2 mg/dL (ref 0.2–1.2)
BUN: 15 mg/dL (ref 6–23)
CHLORIDE: 98 meq/L (ref 96–112)
CO2: 28 meq/L (ref 19–32)
Calcium: 9.3 mg/dL (ref 8.4–10.5)
Creat: 1.18 mg/dL (ref 0.50–1.35)
GFR, Est African American: 71 mL/min
GFR, Est Non African American: 62 mL/min
Glucose, Bld: 83 mg/dL (ref 70–99)
POTASSIUM: 6.3 meq/L — AB (ref 3.5–5.3)
SODIUM: 136 meq/L (ref 135–145)
TOTAL PROTEIN: 7.2 g/dL (ref 6.0–8.3)

## 2013-04-21 LAB — LIPID PANEL
Cholesterol: 192 mg/dL (ref 0–200)
HDL: 57 mg/dL (ref >39)
LDL CALC: 114 mg/dL — AB (ref 0–99)
Total CHOL/HDL Ratio: 3.4 Ratio
Triglycerides: 107 mg/dL (ref <150)
VLDL: 21 mg/dL (ref 0–40)

## 2013-04-21 LAB — TSH: TSH: 2.14 u[IU]/mL (ref 0.350–4.500)

## 2013-04-22 ENCOUNTER — Other Ambulatory Visit (INDEPENDENT_AMBULATORY_CARE_PROVIDER_SITE_OTHER): Payer: Medicare Other | Admitting: *Deleted

## 2013-04-22 DIAGNOSIS — E875 Hyperkalemia: Secondary | ICD-10-CM

## 2013-04-22 DIAGNOSIS — I10 Essential (primary) hypertension: Secondary | ICD-10-CM

## 2013-04-22 LAB — BASIC METABOLIC PANEL
BUN: 15 mg/dL (ref 6–23)
CALCIUM: 9.3 mg/dL (ref 8.4–10.5)
CO2: 25 mEq/L (ref 19–32)
CREATININE: 1.12 mg/dL (ref 0.50–1.35)
Chloride: 102 mEq/L (ref 96–112)
GLUCOSE: 116 mg/dL — AB (ref 70–99)
Potassium: 4 mEq/L (ref 3.5–5.3)
Sodium: 137 mEq/L (ref 135–145)

## 2013-04-22 NOTE — Progress Notes (Signed)
Patient here for future lab draw of BMET only, per Dr Vonna Kotyk order.

## 2013-05-04 ENCOUNTER — Telehealth: Payer: Self-pay | Admitting: General Practice

## 2013-05-04 NOTE — Telephone Encounter (Signed)
Called and left vm for patient to give [place] a call back to schedule a 1 month fu frm 2/23 date of service w/Dr Carlota Raspberry

## 2013-05-07 ENCOUNTER — Encounter: Payer: Self-pay | Admitting: Cardiovascular Disease

## 2013-05-07 ENCOUNTER — Ambulatory Visit (INDEPENDENT_AMBULATORY_CARE_PROVIDER_SITE_OTHER): Payer: Medicare Other | Admitting: Cardiovascular Disease

## 2013-05-07 VITALS — BP 150/101 | HR 71 | Ht 68.5 in | Wt 212.1 lb

## 2013-05-07 DIAGNOSIS — R0602 Shortness of breath: Secondary | ICD-10-CM

## 2013-05-07 DIAGNOSIS — R079 Chest pain, unspecified: Secondary | ICD-10-CM

## 2013-05-07 MED ORDER — ASPIRIN EC 81 MG PO TBEC: 81.0000 mg | DELAYED_RELEASE_TABLET | Freq: Every day | ORAL | Status: AC

## 2013-05-07 NOTE — Patient Instructions (Addendum)
Your physician has recommended you make the following change in your medication:  START Aspirin 81 mg daily  Your physician has requested that you have en exercise stress myoview. For further information please visit HugeFiesta.tn. Please follow instruction sheet, as given.  Your physician wants you to follow-up in: 1 year with Dr. Burt Knack.  You will receive a reminder letter in the mail two months in advance. If you don't receive a letter, please call our office to schedule the follow-up appointment.

## 2013-05-07 NOTE — Progress Notes (Signed)
HPI:  73 year old gentleman presenting for initial cardiac evaluation. The patient is referred by Dr. Carlota Raspberry for evaluation of exertional dyspnea and chest discomfort.  He exercises on a NordicTrack. At low level activity he has no symptoms. However, he describes dyspnea with exertion when he increase his his work load. His symptoms are more noticeable than they have been in the past. He denies resting shortness of breath or shortness of breath with normal activities. The patient has had no palpitations, edema, orthopnea, or PND. He does admit to a pressure like sensation in his chest only when he is doing hard work outside in his yard. This generally occurs and he is using his arms or lifting over his head. Symptoms resolve with rest. He has not had any chest discomfort with aerobic exercise on a NordicTrack. He has no personal history of heart disease. He had a stress test about 10 years ago which was unremarkable per his report.  His cardiovascular risk factors including essential hypertension, hyperlipidemia, and recently diagnosed type 2 diabetes. He is motivated to begin in more vigorous exercise program and make dietary changes. He has a remote history of smoking but quit many years ago. His father had "atherosclerotic cardiovascular disease" in his 64s but never required stenting or coronary bypass surgery.   Outpatient Encounter Prescriptions as of 05/07/2013  Medication Sig  . atorvastatin (LIPITOR) 10 MG tablet Take 1 tablet (10 mg total) by mouth daily.  Marland Kitchen b complex vitamins tablet Take 1 tablet by mouth daily.  Marland Kitchen CALCIUM-MAGNESUIUM-ZINC 333-133-8.3 MG TABS Take by mouth daily.  . Cholecalciferol (VITAMIN D-3 PO) Take 1,000 mg by mouth daily.  . Ginkgo Biloba Extract 60 MG CAPS Take by mouth.  . hydrochlorothiazide (MICROZIDE) 12.5 MG capsule TAKE ONE CAPSULE EVERY DAY  . Kava, Piper methysticum, (KAVA KAVA PO) Take by mouth.  . Licorice, Glycyrrhiza glabra, (LICORICE PO) Take by mouth.   . metFORMIN (GLUCOPHAGE) 500 MG tablet Take 1 tablet (500 mg total) by mouth daily with breakfast.  . Saw Palmetto, Serenoa repens, (SAW PALMETTO PO) Take by mouth.  . [DISCONTINUED] zoster vaccine live, PF, (ZOSTAVAX) 27253 UNT/0.65ML injection Inject 19,400 Units into the skin once.    Codeine  Past Medical History  Diagnosis Date  . Hyperlipidemia   . Allergy   . Arthritis     Past Surgical History  Procedure Laterality Date  . Hernia repair    . Colon surgery    . Melanoma excision  1984  . Appendectomy      History   Social History  . Marital Status: Married    Spouse Name: N/A    Number of Children: N/A  . Years of Education: N/A   Occupational History  . Art gallery manager    Social History Main Topics  . Smoking status: Former Smoker -- 15 years    Types: Cigarettes, Pipe, Cigars    Quit date: 02/26/1981  . Smokeless tobacco: Not on file  . Alcohol Use: 4.2 oz/week    7 Glasses of wine per week     Comment: wine with meals  . Drug Use: No  . Sexual Activity: Yes   Other Topics Concern  . Not on file   Social History Narrative  . No narrative on file    Family History  Problem Relation Age of Onset  . Dementia Mother   . Heart disease Father   . Multiple sclerosis Sister     ROS:  General: no fevers/chills/night sweats Eyes: no  blurry vision, diplopia, or amaurosis ENT: no sore throat or hearing loss Resp: no cough, wheezing, or hemoptysis CV: no edema or palpitations GI: no abdominal pain, nausea, vomiting, diarrhea, or constipation GU: no dysuria, frequency, or hematuria Skin: no rash Neuro: no headache, numbness, tingling, or weakness of extremities Musculoskeletal: no joint pain or swelling Heme: no bleeding, DVT, or easy bruising Endo: no polydipsia or polyuria  BP 150/101  Pulse 71  Ht 5' 8.5" (1.74 m)  Wt 96.217 kg (212 lb 1.9 oz)  BMI 31.78 kg/m2  PHYSICAL EXAM: Pt is alert and oriented, WD, WN, in no distress. HEENT:  normal Neck: JVP normal. Carotid upstrokes normal without bruits. No thyromegaly. Lungs: equal expansion, clear bilaterally CV: Apex is discrete and nondisplaced, RRR without murmur or gallop Abd: soft, NT, +BS, no bruit, no hepatosplenomegaly Back: no CVA tenderness Ext: no C/C/E        DP/PT pulses intact and = Skin: warm and dry without rash Neuro: CNII-XII intact             Strength intact = bilaterally  EKG:  Normal sinus, within normal limits.  ASSESSMENT AND PLAN: 1. Exertional dyspnea and chest tightness. As detailed above, exertional dyspnea is the patient's primary complaint. However, he does have chest tightness with heavier exertion involving use of his arms. Considering his cardiovascular risk factors, I think stress testing is clearly warranted. I have recommended an exercise Myoview stress scan and explained the rationale for this test. The patient understands and agrees. I've advised that he start taking aspirin 81 mg. His cardiovascular risk factors are under good control under the care of Dr. Carlota Raspberry.  2. Hypertension with white coat component. Home blood pressures range from 120 to 130s over 80s. He keeps a careful watch on his blood pressure and it is unusual for him to see a reading greater than 140/90. While his blood pressure is significantly elevated today, I think that is primarily related to white coat syndrome and his first visit in this office.  3. Hyperlipidemia the patient is treated with atorvastatin.  For followup, will schedule a return visit in one year as long as the stress test is within normal limits. If he continues to be stable, I will plan on seeing him back on an as-needed basis since he follows with Dr. Carlota Raspberry.

## 2013-05-12 ENCOUNTER — Other Ambulatory Visit: Payer: Self-pay | Admitting: Family Medicine

## 2013-05-21 ENCOUNTER — Ambulatory Visit (HOSPITAL_COMMUNITY): Payer: Medicare Other | Attending: Cardiovascular Disease | Admitting: Radiology

## 2013-05-21 VITALS — BP 127/77 | HR 62 | Ht 68.5 in | Wt 208.0 lb

## 2013-05-21 DIAGNOSIS — R0602 Shortness of breath: Secondary | ICD-10-CM | POA: Insufficient documentation

## 2013-05-21 DIAGNOSIS — R079 Chest pain, unspecified: Secondary | ICD-10-CM

## 2013-05-21 MED ORDER — TECHNETIUM TC 99M SESTAMIBI GENERIC - CARDIOLITE
30.0000 | Freq: Once | INTRAVENOUS | Status: AC | PRN
  Administered 2013-05-21: 30 via INTRAVENOUS

## 2013-05-21 MED ORDER — TECHNETIUM TC 99M SESTAMIBI GENERIC - CARDIOLITE
10.0000 | Freq: Once | INTRAVENOUS | Status: AC | PRN
  Administered 2013-05-21: 10 via INTRAVENOUS

## 2013-05-21 NOTE — Progress Notes (Signed)
[facility] [address] [place], [place] [address] [phone]    Cardiology Nuclear Med Study  Corey Cervantes is a 73 y.o. male     MRN : [id number]     DOB: 1940-05-25  Procedure Date: 05/21/2013  Nuclear Med Background Indication for Stress Test:  Evaluation for Ischemia History: No prior known history of CAD, ~10 years Myocardial Perfusion Imaging-Normal Cardiac Risk Factors: History of Smoking, Hypertension, Lipids and NIDDM  Symptoms: Chest Pressure with exertion (last occurrence 2 months ago), DOE and SOB   Nuclear Pre-Procedure Caffeine/Decaff Intake:  None NPO After: 8:00pm   Lungs:  clear O2 Sat: 99% on room air. IV 0.9% NS with Angio Cath:  22g  IV Site: R Antecubital  IV Started by:  Crissie Figures, RN  Chest Size (in):  48 Cup Size: n/a  Height: 5' 8.5" (1.74 m)  Weight:  208 lb (94.348 kg)  BMI:  Body mass index is 31.16 kg/(m^2). Tech Comments:  N/A    Nuclear Med Study 1 or 2 day study: 1 day  Stress Test Type:  Stress  Reading MD: N/A  Order Authorizing Provider:  Sherren Mocha, MD  Resting Radionuclide: Technetium 85m Sestamibi  Resting Radionuclide Dose: 11.0 mCi   Stress Radionuclide:  Technetium 16m Sestamibi  Stress Radionuclide Dose: 33.0 mCi           Stress Protocol Rest HR: 62 Stress HR: 144  Rest BP: 127/77 Stress BP: 196/80  Exercise Time (min): 8:33 METS: 10.1   Predicted Max HR: 149 bpm % Max HR: 96.64 bpm Rate Pressure Product: 28224   Dose of Adenosine (mg):  n/a Dose of Lexiscan: n/a mg  Dose of Atropine (mg): n/a Dose of Dobutamine: n/a mcg/kg/min (at max HR)  Stress Test Technologist: Irven Baltimore, RN  Nuclear Technologist:  Charlton Amor, CNMT     Rest Procedure:  Myocardial perfusion imaging was performed at rest 45 minutes following the intravenous administration of Technetium 53m Sestamibi. Rest ECG: NSR with non-specific ST-T wave changes  Stress Procedure:  The patient exercised on the  treadmill utilizing the Bruce Protocol for 8:33 minutes, RPE=15. The patient stopped due to DOE and denied any chest pain. There was a mild hypertensive response to exercise. Technetium 11m Sestamibi was injected at peak exercise and myocardial perfusion imaging was performed after a brief delay. Stress ECG: No significant change from baseline ECG  QPS Raw Data Images:  Normal; no motion artifact; normal heart/lung ratio. Stress Images:  Normal homogeneous uptake in all areas of the myocardium. Rest Images:  Normal homogeneous uptake in all areas of the myocardium. Subtraction (SDS):  No evidence of ischemia. Transient Ischemic Dilatation (Normal <1.22):  0.88 Lung/Heart Ratio (Normal <0.45):  0.26  Quantitative Gated Spect Images QGS EDV:  92 ml QGS ESV:  36 ml  Impression Exercise Capacity:  Good exercise capacity. BP Response:  Normal blood pressure response. Clinical Symptoms:  There is dyspnea. ECG Impression:  No significant ST segment change suggestive of ischemia. Comparison with Prior Nuclear Study: No images to compare  Overall Impression:  Normal stress nuclear study.  LV Ejection Fraction: 61%.  LV Wall Motion:  NL LV Function; NL Wall Motion  [employer]

## 2013-05-23 ENCOUNTER — Other Ambulatory Visit: Payer: Self-pay | Admitting: Physician Assistant

## 2013-05-25 ENCOUNTER — Encounter: Payer: Self-pay | Admitting: Family Medicine

## 2013-05-25 DIAGNOSIS — R739 Hyperglycemia, unspecified: Secondary | ICD-10-CM | POA: Insufficient documentation

## 2013-05-25 DIAGNOSIS — E785 Hyperlipidemia, unspecified: Secondary | ICD-10-CM | POA: Insufficient documentation

## 2013-05-25 DIAGNOSIS — I10 Essential (primary) hypertension: Secondary | ICD-10-CM | POA: Insufficient documentation

## 2013-06-27 ENCOUNTER — Encounter: Payer: Self-pay | Admitting: Family Medicine

## 2013-06-29 NOTE — Telephone Encounter (Signed)
Deanglo,   That will be fine.  Thanks for letting me know.  We can plan on possible fasting blood work at that time, and will go over a few of the topics from your last physical, including immunizations as I think we need to still update one. Hope you are doing well, and kudos for changing up the diet and stopping alcohol.  I know this is not what you want to do, but we can see if it makes an impact on your readings and then as things improve, I don't feel like it would be an issue to restart a glass of wine per day, as there are still some health benefits to this. See You in June!  -Janeann Forehand.

## 2013-08-17 ENCOUNTER — Ambulatory Visit (INDEPENDENT_AMBULATORY_CARE_PROVIDER_SITE_OTHER): Payer: Medicare Other | Admitting: Family Medicine

## 2013-08-17 ENCOUNTER — Encounter: Payer: Self-pay | Admitting: Family Medicine

## 2013-08-17 VITALS — BP 121/73 | HR 65 | Temp 97.5°F | Resp 16 | Ht 69.0 in | Wt 191.8 lb

## 2013-08-17 DIAGNOSIS — E785 Hyperlipidemia, unspecified: Secondary | ICD-10-CM

## 2013-08-17 DIAGNOSIS — R739 Hyperglycemia, unspecified: Secondary | ICD-10-CM

## 2013-08-17 DIAGNOSIS — R7309 Other abnormal glucose: Secondary | ICD-10-CM

## 2013-08-17 DIAGNOSIS — Z23 Encounter for immunization: Secondary | ICD-10-CM

## 2013-08-17 DIAGNOSIS — I10 Essential (primary) hypertension: Secondary | ICD-10-CM

## 2013-08-17 LAB — LIPID PANEL
Cholesterol: 164 mg/dL (ref 0–200)
HDL: 47 mg/dL (ref >39)
LDL CALC: 103 mg/dL — AB (ref 0–99)
Total CHOL/HDL Ratio: 3.5 Ratio
Triglycerides: 70 mg/dL (ref <150)
VLDL: 14 mg/dL (ref 0–40)

## 2013-08-17 LAB — POCT GLYCOSYLATED HEMOGLOBIN (HGB A1C): Hemoglobin A1C: 5.9

## 2013-08-17 LAB — GLUCOSE, POCT (MANUAL RESULT ENTRY): POC Glucose: 98 mg/dl (ref 70–99)

## 2013-08-17 MED ORDER — HYDROCHLOROTHIAZIDE 12.5 MG PO CAPS: ORAL_CAPSULE | ORAL | Status: DC

## 2013-08-17 MED ORDER — ATORVASTATIN CALCIUM 10 MG PO TABS: 10.0000 mg | ORAL_TABLET | Freq: Every day | ORAL | Status: DC

## 2013-08-17 NOTE — Progress Notes (Addendum)
Subjective:   This chart was scribed for Corey Agreste, MD by Corey Cervantes, [facility] Scribe. This patient was seen in room 22 and the patient's care was started 3:00 PM.    Patient ID: [name], male    DOB: January 29, 1941, 73 y.o.   MRN: [id number]  HPI  HPI Comments: Corey Cervantes is a 73 y.o. Male with a PMHx of HTN and Hyperlipidemia who presents to [facility] for follow up today. Last seen 4 months ago.  1. HTN: Borderline control and questionable abnormal EKG along with slight dyspnea with exercise. Pt was referred to cardiology Stress Myoview on 05/21/2013-Normal stress nuclear study Thought to have a white coat component to his high blood pressure as well as taking aspirin 81 mg QD No light-headedness or dizziness with current regimen Pt states he checks his blood pressure regularly with recorded readings of 100-115/60-75  2. Hyperlipidemia: Currently on Lipitor 10 mg QD. Overall stable 4 months ago. Last liver panel normal at last visit.  Lab Results  Component Value Date   CHOL 192 04/20/2013   HDL 57 04/20/2013   LDLCALC 114* 04/20/2013   TRIG 107 04/20/2013   CHOLHDL 3.4 04/20/2013    3. Health Maintenance: Prescription given for Zostavax at last visit- was unable to follow up with this; states prescription may be expired at this time. Due for pneumonia vaccine.   4. Pre Diabetes: Hemoglobin A1C was 6.3 at last office visit. Pt currently taking Metformin 500 mg once daily and HCTZ 12.5 mg daily. No side effects at this time. Weight at last office visit was 209, down to 191 today with exercise, diet changes, and cutting back on alcohol. Pt goal weight is 185. Pt was drinking about 2-3 glasses of wine daily and currently does not drink at all. States he has cut back for health reasons, however, he says he also wishes to set an example for his sons.  At this time he denies any HA, chest pain, SOB, fatigue, visual disturbances,  or leg swelling.  Patient Active Problem List   Diagnosis Date Noted  . HTN (hypertension) 05/25/2013  . Other and unspecified hyperlipidemia 05/25/2013  . Hyperglycemia 05/25/2013   Past Medical History  Diagnosis Date  . Hyperlipidemia   . Allergy   . Arthritis    Past Surgical History  Procedure Laterality Date  . Hernia repair    . Colon surgery    . Melanoma excision  1984  . Appendectomy     Allergies  Allergen Reactions  . Codeine Other (See Comments)    NIGHTMARES   Prior to Admission medications   Medication Sig Start Date End Date Taking? Authorizing Provider  aspirin EC 81 MG tablet Take 1 tablet (81 mg total) by mouth daily. 05/07/13   Corey Mocha, MD  atorvastatin (LIPITOR) 10 MG tablet Take 1 tablet (10 mg total) by mouth daily. 04/20/13   Corey Agreste, MD  b complex vitamins tablet Take 1 tablet by mouth daily.    Historical Provider, MD  CALCIUM-MAGNESUIUM-ZINC 333-133-8.3 MG TABS Take by mouth daily.    Historical Provider, MD  Cholecalciferol (VITAMIN D-3 PO) Take 1,000 mg by mouth daily.    Historical Provider, MD  Ginkgo Biloba Extract 60 MG CAPS Take by mouth.    Historical Provider, MD  hydrochlorothiazide (MICROZIDE) 12.5 MG capsule TAKE ONE CAPSULE EVERY DAY 04/20/13   Corey Agreste, MD  hydrochlorothiazide (MICROZIDE) 12.5 MG  capsule TAKE ONE CAPSULE EVERY DAY 05/12/13   Corey Bale, PA-C  Kava, Piper methysticum, (KAVA KAVA PO) Take by mouth.    Historical Provider, MD  Licorice, Glycyrrhiza glabra, (LICORICE PO) Take by mouth.    Historical Provider, MD  metFORMIN (GLUCOPHAGE) 500 MG tablet Take 1 tablet (500 mg total) by mouth daily with breakfast. 04/20/13   Corey Agreste, MD  Saw Palmetto, Serenoa repens, (SAW PALMETTO PO) Take by mouth.    Historical Provider, MD   History   Social History  . Marital Status: Married    Spouse Name: N/A    Number of Children: N/A  . Years of Education: N/A   Occupational History  . Designer, jewellery    Social History Main Topics  . Smoking status: Former Smoker -- 15 years    Types: Cigarettes, Pipe, Cigars    Quit date: 02/26/1981  . Smokeless tobacco: Not on file  . Alcohol Use: 4.2 oz/week    7 Glasses of wine per week     Comment: wine with meals  . Drug Use: No  . Sexual Activity: Yes   Other Topics Concern  . Not on file   Social History Narrative  . No narrative on file     Review of Systems  Constitutional: Negative for fatigue and unexpected weight change.  Eyes: Negative for visual disturbance.  Respiratory: Negative for cough, chest tightness and shortness of breath.   Cardiovascular: Negative for chest pain, palpitations and leg swelling.  Gastrointestinal: Negative for abdominal pain and blood in stool.  Neurological: Negative for dizziness, light-headedness and headaches.     Objective:  Physical Exam  Vitals reviewed. Constitutional: He is oriented to person, place, and time. He appears well-developed and well-nourished.  HENT:  Head: Normocephalic and atraumatic.  Eyes: EOM are normal. Pupils are equal, round, and reactive to light.  Neck: No JVD present. Carotid bruit is not present.  Cardiovascular: Normal rate, regular rhythm and normal heart sounds.   No murmur heard. Pulmonary/Chest: Effort normal and breath sounds normal. He has no rales.  Musculoskeletal: He exhibits no edema.  Neurological: He is alert and oriented to person, place, and time.  Skin: Skin is warm and dry.  Psychiatric: He has a normal mood and affect.      Filed Vitals:   08/17/13 1442  BP: 121/73  Pulse: 65  Temp: 97.5 F (36.4 C)  TempSrc: Oral  Resp: 16  Height: 5\' 9"  (1.753 m)  Weight: 191 lb 12.8 oz (87 kg)  SpO2: 100%    Results for orders placed in visit on 08/17/13  GLUCOSE, POCT (MANUAL RESULT ENTRY)      Result Value Ref Range   POC Glucose 98  70 - 99 mg/dl  POCT GLYCOSYLATED HEMOGLOBIN (HGB A1C)      Result Value Ref Range   Hemoglobin  A1C 5.9       Assessment & Plan:   Corey Cervantes is a 73 y.o. male Hyperglycemia/prediabetes - Plan: POCT glucose (manual entry), POCT glycosylated hemoglobin (Hb A1C)  - much improved weight and readings with his diet changes and exercise. Options discussed re: metformin, and chose to stop metformin at this point, with continued emphasis oin diet and exercise and alcohol avoidance.   Other and unspecified hyperlipidemia   - Plan: Lipid panel pending. Cont lipitor 10mg  qd - refilled.    Essential hypertension  -stable. Discussed chance of lower numbers now that he is losing weight and orthostatic  hypotension precautions. If lower numbers, then could try to hold HCTZ with close monitoring of BP.   Need for prophylactic vaccination against Streptococcus pneumoniae (pneumococcus) - Plan: Pneumococcal conjugate vaccine 13-valent IM  - Prevnar given, return for procedure only visit - pneumovax - in at least 8 weeks.   Plan on follow up appt in 6 months.    Meds ordered this encounter  Medications  . hydrochlorothiazide (MICROZIDE) 12.5 MG capsule    Sig: TAKE ONE CAPSULE EVERY DAY    Dispense:  90 capsule    Refill:  1  . atorvastatin (LIPITOR) 10 MG tablet    Sig: Take 1 tablet (10 mg total) by mouth daily.    Dispense:  90 tablet    Refill:  1   Patient Instructions  Keep a record of your blood pressures outside of the office. IF these are running low (under 110 on top number) or lightheadedness persists, we may need to hold this medicine.   We can stop metformin for now - continue the great work with diet and exercise. We can recheck blood sugar in 6 months.   Prevnar pneumonia vaccine today, return anytime after 8 weeks for other pneumonia vaccine.      I personally performed the services described in this documentation, which was scribed in my presence. The recorded information has been reviewed and considered, and addended by me as needed.

## 2013-08-17 NOTE — Patient Instructions (Addendum)
Keep a record of your blood pressures outside of the office. IF these are running low (under 110 on top number) or lightheadedness persists, we may need to hold this medicine.   We can stop metformin for now - continue the great work with diet and exercise. We can recheck blood sugar in 6 months.   Prevnar pneumonia vaccine today, return anytime after 8 weeks for other pneumonia vaccine.

## 2013-09-04 ENCOUNTER — Other Ambulatory Visit: Payer: Self-pay

## 2013-10-11 ENCOUNTER — Ambulatory Visit (INDEPENDENT_AMBULATORY_CARE_PROVIDER_SITE_OTHER): Payer: Medicare Other | Admitting: Family Medicine

## 2013-10-11 VITALS — BP 128/72 | HR 71 | Temp 98.1°F | Resp 15 | Ht 68.0 in | Wt 191.0 lb

## 2013-10-11 DIAGNOSIS — K047 Periapical abscess without sinus: Secondary | ICD-10-CM

## 2013-10-11 MED ORDER — PENICILLIN V POTASSIUM 500 MG PO TABS: 500.0000 mg | ORAL_TABLET | Freq: Four times a day (QID) | ORAL | Status: DC

## 2013-10-11 MED ORDER — OXYCODONE-ACETAMINOPHEN 5-325 MG PO TABS: 1.0000 | ORAL_TABLET | Freq: Three times a day (TID) | ORAL | Status: DC | PRN

## 2013-10-11 NOTE — Progress Notes (Signed)
Subjective:    Patient ID: Corey Cervantes, male    DOB: 11-27-1940, 73 y.o.   MRN: [id number]  Dental Pain  Pertinent negatives include no fever.   Chief Complaint  Patient presents with  . Dental Pain    left lower side x 1 year off and on   This chart was scribed for Delman Cheadle, MD by Thea Alken, ED Scribe. This patient was seen in room 12 and the patient's care was started at 11:30 AM.  HPI Comments: Corey Cervantes is a 73 y.o. male who presents to the [facility] complaining of left lower dental pain onset 3 days ago. Pt reports he was seen in the past for similar dental pain by Dr. Carlota Raspberry but denies having seeing a dentist. He reports his left lower gum is infected. . Pt has taken advil and tried oral gel. Pt reports applying alcohol to lower gum line with relief to pain. Pt is requesting a referral for a dentist. Pt denies being on abx recently. He denies allergies to abx.  Pt reports his sugars have been well and has lost 25 lbs.    Past Medical History  Diagnosis Date  . Hyperlipidemia   . Allergy   . Arthritis    Past Surgical History  Procedure Laterality Date  . Hernia repair    . Colon surgery    . Melanoma excision  1984  . Appendectomy     Prior to Admission medications   Medication Sig Start Date End Date Taking? Authorizing Provider  aspirin EC 81 MG tablet Take 1 tablet (81 mg total) by mouth daily. 05/07/13  Yes Sherren Mocha, MD  atorvastatin (LIPITOR) 10 MG tablet Take 1 tablet (10 mg total) by mouth daily. 08/17/13  Yes Wendie Agreste, MD  b complex vitamins tablet Take 1 tablet by mouth daily.   Yes Historical Provider, MD  CALCIUM-MAGNESUIUM-ZINC 333-133-8.3 MG TABS Take by mouth daily.   Yes Historical Provider, MD  Cholecalciferol (VITAMIN D-3 PO) Take 1,000 mg by mouth daily.   Yes Historical Provider, MD  Ginkgo Biloba Extract 60 MG CAPS Take by mouth.   Yes Historical Provider, MD  hydrochlorothiazide (MICROZIDE) 12.5 MG  capsule TAKE ONE CAPSULE EVERY DAY 08/17/13  Yes Wendie Agreste, MD  Kava, Piper methysticum, (KAVA KAVA PO) Take by mouth.   Yes Historical Provider, MD  Licorice, Glycyrrhiza glabra, (LICORICE PO) Take by mouth.   Yes Historical Provider, MD  Saw Palmetto, Serenoa repens, (SAW PALMETTO PO) Take by mouth.   Yes Historical Provider, MD    Review of Systems  Constitutional: Negative for fever and chills.  HENT: Positive for dental problem.    BP 128/72  Pulse 71  Temp(Src) 98.1 F (36.7 C) (Oral)  Resp 15  Ht 5\' 8"  (1.727 m)  Wt 191 lb (86.637 kg)  BMI 29.05 kg/m2  SpO2 97% Objective:   Physical Exam  Nursing note and vitals reviewed. Constitutional: He is oriented to person, place, and time. He appears well-developed and well-nourished. No distress.  HENT:  Head: Normocephalic and atraumatic.  Erythema with purulent drainage on the left lower gum.  Eyes: Conjunctivae and EOM are normal.  Neck: Neck supple.  Cardiovascular: Normal rate.   Pulmonary/Chest: Effort normal.  Musculoskeletal: Normal range of motion.  Neurological: He is alert and oriented to person, place, and time.  Skin: Skin is warm and dry.  Psychiatric: He has a normal mood and affect. His behavior is  normal.    Assessment & Plan:   1. Dental abscess      Meds ordered this encounter  Medications  . penicillin v potassium (VEETID) 500 MG tablet    Sig: Take 1 tablet (500 mg total) by mouth 4 (four) times daily.    Dispense:  56 tablet    Refill:  0  . oxyCODONE-acetaminophen (ROXICET) 5-325 MG per tablet    Sig: Take 1 tablet by mouth every 8 (eight) hours as needed for severe pain.    Dispense:  20 tablet    Refill:  0    I personally performed the services described in this documentation, which was scribed in my presence. The recorded information has been reviewed and considered, and addended by me as needed.  Delman Cheadle, MD MPH

## 2013-10-11 NOTE — Patient Instructions (Signed)
Call your dentist on Monday to get an appointment. I use [facility] - with Dr. Malcolm Metro - he is excellent - across from [facility] off [address].  Dental Abscess A dental abscess is a collection of infected fluid (pus) from a bacterial infection in the inner part of the tooth (pulp). It usually occurs at the end of the tooth's root.  CAUSES   Severe tooth decay.  Trauma to the tooth that allows bacteria to enter into the pulp, such as a broken or chipped tooth. SYMPTOMS   Severe pain in and around the infected tooth.  Swelling and redness around the abscessed tooth or in the mouth or face.  Tenderness.  Pus drainage.  Bad breath.  Bitter taste in the mouth.  Difficulty swallowing.  Difficulty opening the mouth.  Nausea.  Vomiting.  Chills.  Swollen neck glands. DIAGNOSIS   A medical and dental history will be taken.  An examination will be performed by tapping on the abscessed tooth.  X-rays may be taken of the tooth to identify the abscess. TREATMENT The goal of treatment is to eliminate the infection. You may be prescribed antibiotic medicine to stop the infection from spreading. A root canal may be performed to save the tooth. If the tooth cannot be saved, it may be pulled (extracted) and the abscess may be drained.  HOME CARE INSTRUCTIONS  Only take over-the-counter or prescription medicines for pain, fever, or discomfort as directed by your caregiver.  Rinse your mouth (gargle) often with salt water ( tsp salt in 8 oz [250 ml] of warm water) to relieve pain or swelling.  Do not drive after taking pain medicine (narcotics).  Do not apply heat to the outside of your face.  Return to your dentist for further treatment as directed. SEEK MEDICAL CARE IF:  Your pain is not helped by medicine.  Your pain is getting worse instead of better. SEEK IMMEDIATE MEDICAL CARE IF:  You have a fever or persistent symptoms for more than 2-3  days.  You have a fever and your symptoms suddenly get worse.  You have chills or a very bad headache.  You have problems breathing or swallowing.  You have trouble opening your mouth.  You have swelling in the neck or around the eye. Document Released: 02/12/2005 Document Revised: 11/07/2011 Document Reviewed: 05/23/2010 [facility] Patient Information 2015 [place], [place]. This information is not intended to replace advice given to you by your health care provider. Make sure you discuss any questions you have with your health care provider.

## 2014-02-09 ENCOUNTER — Telehealth: Payer: Self-pay | Admitting: Family Medicine

## 2014-02-09 NOTE — Telephone Encounter (Signed)
Left a message for patient to return call for flu shot

## 2014-03-25 ENCOUNTER — Other Ambulatory Visit: Payer: Self-pay | Admitting: Family Medicine

## 2014-05-03 ENCOUNTER — Other Ambulatory Visit: Payer: Self-pay | Admitting: Family Medicine

## 2014-05-18 ENCOUNTER — Encounter: Payer: Self-pay | Admitting: *Deleted

## 2014-05-28 ENCOUNTER — Other Ambulatory Visit: Payer: Self-pay | Admitting: Family Medicine

## 2014-06-09 ENCOUNTER — Ambulatory Visit: Payer: Self-pay | Admitting: Cardiovascular Disease

## 2014-06-28 ENCOUNTER — Other Ambulatory Visit: Payer: Self-pay | Admitting: Physician Assistant

## 2014-07-06 ENCOUNTER — Ambulatory Visit: Payer: Self-pay | Admitting: Cardiology

## 2014-07-12 ENCOUNTER — Encounter: Payer: Self-pay | Admitting: Family Medicine

## 2014-07-28 ENCOUNTER — Other Ambulatory Visit: Payer: Self-pay | Admitting: Family Medicine

## 2014-07-29 NOTE — Telephone Encounter (Signed)
Dr Carlota Raspberry, pt has appt sch w/you for 10/18/14 CPE, but hasn't been seen for chronic meds since 08/17/13. Do you want to OK 3 mos of RFs to cover him until CPE?

## 2014-07-30 NOTE — Telephone Encounter (Signed)
Yes - refilled for 3 months. Thanks. -JG

## 2014-07-31 ENCOUNTER — Other Ambulatory Visit: Payer: Self-pay | Admitting: Urgent Care

## 2014-08-27 ENCOUNTER — Other Ambulatory Visit: Payer: Self-pay | Admitting: Family Medicine

## 2014-08-27 NOTE — Telephone Encounter (Signed)
Can we refill until August? Pt has an appt with Dr. Carlota Raspberry.

## 2014-09-22 ENCOUNTER — Other Ambulatory Visit: Payer: Self-pay | Admitting: Family Medicine

## 2014-09-26 ENCOUNTER — Other Ambulatory Visit: Payer: Self-pay | Admitting: Family Medicine

## 2014-10-18 ENCOUNTER — Encounter: Payer: Self-pay | Admitting: Family Medicine

## 2014-10-18 ENCOUNTER — Ambulatory Visit (INDEPENDENT_AMBULATORY_CARE_PROVIDER_SITE_OTHER): Payer: Medicare Other | Admitting: Family Medicine

## 2014-10-18 VITALS — BP 114/72 | HR 69 | Temp 97.2°F | Resp 16 | Ht 69.0 in | Wt 194.2 lb

## 2014-10-18 DIAGNOSIS — Z23 Encounter for immunization: Secondary | ICD-10-CM | POA: Diagnosis not present

## 2014-10-18 DIAGNOSIS — R7309 Other abnormal glucose: Secondary | ICD-10-CM | POA: Diagnosis not present

## 2014-10-18 DIAGNOSIS — E063 Autoimmune thyroiditis: Secondary | ICD-10-CM | POA: Diagnosis not present

## 2014-10-18 DIAGNOSIS — E785 Hyperlipidemia, unspecified: Secondary | ICD-10-CM

## 2014-10-18 DIAGNOSIS — Z0000 Encounter for general adult medical examination without abnormal findings: Secondary | ICD-10-CM | POA: Diagnosis not present

## 2014-10-18 DIAGNOSIS — H9190 Unspecified hearing loss, unspecified ear: Secondary | ICD-10-CM

## 2014-10-18 DIAGNOSIS — R29818 Other symptoms and signs involving the nervous system: Secondary | ICD-10-CM | POA: Diagnosis not present

## 2014-10-18 DIAGNOSIS — I10 Essential (primary) hypertension: Secondary | ICD-10-CM

## 2014-10-18 DIAGNOSIS — R2689 Other abnormalities of gait and mobility: Secondary | ICD-10-CM

## 2014-10-18 DIAGNOSIS — R7303 Prediabetes: Secondary | ICD-10-CM

## 2014-10-18 LAB — COMPLETE METABOLIC PANEL WITH GFR
ALBUMIN: 4 g/dL (ref 3.6–5.1)
ALK PHOS: 57 U/L (ref 40–115)
ALT: 17 U/L (ref 9–46)
AST: 20 U/L (ref 10–35)
BILIRUBIN TOTAL: 0.8 mg/dL (ref 0.2–1.2)
BUN: 21 mg/dL (ref 7–25)
CALCIUM: 8.9 mg/dL (ref 8.6–10.3)
CO2: 26 mmol/L (ref 20–31)
CREATININE: 1.17 mg/dL (ref 0.70–1.18)
Chloride: 105 mmol/L (ref 98–110)
GFR, Est African American: 72 mL/min (ref >=60)
GFR, Est Non African American: 62 mL/min (ref >=60)
Glucose, Bld: 104 mg/dL — ABNORMAL HIGH (ref 65–99)
Potassium: 4.1 mmol/L (ref 3.5–5.3)
Sodium: 144 mmol/L (ref 135–146)
TOTAL PROTEIN: 6.8 g/dL (ref 6.1–8.1)

## 2014-10-18 LAB — HEMOGLOBIN A1C
Hgb A1c MFr Bld: 6.2 % — ABNORMAL HIGH (ref <5.7)
Mean Plasma Glucose: 131 mg/dL — ABNORMAL HIGH (ref <117)

## 2014-10-18 LAB — LIPID PANEL
CHOL/HDL RATIO: 3.9 ratio (ref <=5.0)
Cholesterol: 177 mg/dL (ref 125–200)
HDL: 45 mg/dL (ref >=40)
LDL Cholesterol: 118 mg/dL (ref <130)
Triglycerides: 68 mg/dL (ref <150)
VLDL: 14 mg/dL (ref <30)

## 2014-10-18 LAB — TSH: TSH: 2.215 u[IU]/mL (ref 0.350–4.500)

## 2014-10-18 MED ORDER — ATORVASTATIN CALCIUM 10 MG PO TABS: 10.0000 mg | ORAL_TABLET | Freq: Every day | ORAL | Status: DC

## 2014-10-18 MED ORDER — HYDROCHLOROTHIAZIDE 12.5 MG PO CAPS
ORAL_CAPSULE | ORAL | Status: DC
Start: 2014-10-18 — End: 2015-04-27

## 2014-10-18 NOTE — Progress Notes (Signed)
Subjective:  This chart was scribed for Corey Ray, MD by Corey Cervantes, ED Scribe. This patient was seen in room 24 and the patient's care was started at 2:38 PM.   Patient ID: Corey Cervantes, male    DOB: [birth year], 74 y.o.   MRN: [id number]  HPI   Chief Complaint  Patient presents with  . Annual Exam   HPI Comments: Corey Cervantes is a 74 y.o. male who presents to the [facility] for an annual exam.  Cancer Screening Colonoscopy- 2009 Dr. Carlean Cervantes. 2 polyp removed and diverticula.  He did have an adenomatous polyp, recommend repeat in 2 years. Has not scheduled an appointment with Dr. Carlean Cervantes for repeat colonoscopy, but will soon.  Prostate cancer screening- elevated PSA in past and was referred to urology. Pt reports urinary urgency and frequency, ongoing for while now. Pt finds relief with watching his caffeine intake. Has not seen urology recently. Possible BPH, last visit with urology 1-2 years ago.  Immunizations Immunization History  Administered Date(s) Administered  . Influenza Split 12/24/2011  . Influenza,inj,Quad PF,36+ Mos 04/20/2013  . Pneumococcal Conjugate-13 08/17/2013   He was given prescription for Zostavax at Feb, 2015  physical he is due to pneumovax.  Pt agrees to having flu vaccine and pneumovax today. He will hold off on TDAP today.  Optho Reccommended scheduling optho at his physical in Feb 2015. He was seen by optho about 6 months ago and was prescribed new glasses.   Dentist Also reccommended at Feb 2015 physical . He was seen for dental abscess in August 2015 by Dr Brigitte Pulse. Pt has not been seen by dentistt due to having a busy schedule.   Depression screening  Depression screen [facility] 2/9 10/18/2014 04/20/2013  Decreased Interest 0 0  Down, Depressed, Hopeless 0 0  PHQ - 2 Score 0 0   Functional status screening Positive for hearing difficulty.  Fall screening 0 falls within the past year  Advance Directives Planned on  working on living will at last physical. Pt has not yet started his living will. He plans to do this soon. Instructions were given today.  Hypertension Pt had fasting labs drawn this morning. Had normal stress echo 2008. Evaluated by Dr. Burt Knack, March 2015 for exertion with dyspnea. Reccommended taking 81 mg aspirin at that visit . Had normal stress nuclear test March 2015. Apparently his father had heart disease in 57's but never had stents of CABG. Planned to f/u with cardio from March 2015 visit. On HCTZ 12.5 mg qd. Chronic lymphocystic thyroiditis.  Pt reports loss of balance with sudden movements. BP has been in 120 range. He stopped taking HCTZ but restarted medication in November 2015, during market season.  He denies light headedness and dizziness when not on medication. He denies SOB with exercise.  Lab Results  Component Value Date   TSH 2.140 04/20/2013   Hyperlipidemia On lipitor 10 mg qd. Pt also stopped taking lipitor during market season. States he felt bad coming off this medication and restarted again in October 2015.    Lab Results  Component Value Date   CHOL 164 08/17/2013   HDL 47 08/17/2013   LDLCALC 103* 08/17/2013   TRIG 70 08/17/2013   CHOLHDL 3.5 08/17/2013   Pre Diabetes  A1C was 6.3 in Feb 2015, this improved with increased exercise and stopping alcohol with A1C of 5.9 last June. Stopped metformin at last visit with plan to exerise and diet.  Wt Readings  from Last 3 Encounters:  10/18/14 194 lb 3.2 oz (88.089 kg)  10/11/13 191 lb (86.637 kg)  08/17/13 191 lb 12.8 oz (87 kg)   Neck stiffness Pt has neck stiffness due to arthritis, no new changes. Pt usually takes advil, once every 2 months, prior to bed with relief to stiffness in the morning.  Decreased concentration Pt states he spends a lot of time at home on the computer. Believes this is possibly due to aging.   Patient Active Problem List   Diagnosis Date Noted  . HTN (hypertension) 05/25/2013    . Other and unspecified hyperlipidemia 05/25/2013  . Hyperglycemia 05/25/2013   Past Medical History  Diagnosis Date  . Hyperlipidemia   . Allergy   . Arthritis    Past Surgical History  Procedure Laterality Date  . Hernia repair    . Colon surgery    . Melanoma excision  1984  . Appendectomy     Allergies  Allergen Reactions  . Codeine Other (See Comments)    NIGHTMARES   Prior to Admission medications   Medication Sig Start Date End Date Taking? Authorizing Provider  aspirin EC 81 MG tablet Take 1 tablet (81 mg total) by mouth daily. 05/07/13  Yes Sherren Mocha, MD  atorvastatin (LIPITOR) 10 MG tablet Take 1 tablet (10 mg total) by mouth daily. NO MORE REFILLS WITHOUT OFFICE VISIT/[facility]STINT LABS - 2ND NOTICE 05/03/14  Yes Chelle Jeffery, PA-C  CALCIUM-MAGNESUIUM-ZINC 333-133-8.3 MG TABS Take by mouth daily.   Yes Historical Provider, MD  Ginkgo Biloba Extract 60 MG CAPS Take by mouth.   Yes Historical Provider, MD  hydrochlorothiazide (MICROZIDE) 12.5 MG capsule TAKE ONE CAPSULE BY MOUTH ONCE DAILY. 09/26/14  Yes Bennett Scrape V, PA-C  Licorice, Glycyrrhiza glabra, (LICORICE PO) Take by mouth.   Yes Historical Provider, MD  atorvastatin (LIPITOR) 10 MG tablet TAKE 1 TABLET (10 MG TOTAL) BY MOUTH DAILY.. "NO MORE REFILLS WITHOUT OFFICE VISIT" Patient not taking: Reported on 10/18/2014 06/29/14   Jaynee Eagles, PA-C  atorvastatin (LIPITOR) 10 MG tablet TAKE 1 TABLET (10 MG TOTAL) BY MOUTH DAILY. PATIENT NEEDS OFFICE VISIT FOR ADDITIONAL REFILLS Patient not taking: Reported on 10/18/2014 07/30/14   Wendie Agreste, MD  b complex vitamins tablet Take 1 tablet by mouth daily.    Historical Provider, MD  Cholecalciferol (VITAMIN D-3 PO) Take 1,000 mg by mouth daily.    Historical Provider, MD  Kava, Piper methysticum, (KAVA KAVA PO) Take by mouth.    Historical Provider, MD  oxyCODONE-acetaminophen (ROXICET) 5-325 MG per tablet Take 1 tablet by mouth every 8 (eight) hours as needed for severe  pain. Patient not taking: Reported on 10/18/2014 10/11/13   Shawnee Knapp, MD  penicillin v potassium (VEETID) 500 MG tablet Take 1 tablet (500 mg total) by mouth 4 (four) times daily. Patient not taking: Reported on 10/18/2014 10/11/13   Shawnee Knapp, MD  Saw Palmetto, Serenoa repens, (SAW PALMETTO PO) Take by mouth.    Historical Provider, MD   Social History   Social History  . Marital Status: Married    Spouse Name: N/A  . Number of Children: N/A  . Years of Education: N/A   Occupational History  . Art gallery manager    Social History Main Topics  . Smoking status: Former Smoker -- 15 years    Types: Cigarettes, Pipe, Cigars    Quit date: 02/26/1981  . Smokeless tobacco: Never Used  . Alcohol Use: No     Comment:  wine with meals  . Drug Use: No  . Sexual Activity: Yes   Other Topics Concern  . Not on file   Social History Narrative   Review of Systems See nursing note positive for dental problem, urinary urgency frequency decreased volume, gait problem, neck stiffness, and decreased concentration    Objective:   Physical Exam  Constitutional: He is oriented to person, place, and time. He appears well-developed and well-nourished.  HENT:  Head: Normocephalic and atraumatic.  Right Ear: External ear normal.  Left Ear: External ear normal.  Mouth/Throat: Oropharynx is clear and moist.  Eyes: Conjunctivae and EOM are normal. Pupils are equal, round, and reactive to light.  Neck: Normal range of motion. Neck supple. No thyromegaly present.  Cardiovascular: Normal rate, regular rhythm, normal heart sounds and intact distal pulses.   Pulmonary/Chest: Effort normal and breath sounds normal. No respiratory distress. He has no wheezes.  Abdominal: Soft. He exhibits no distension. There is no tenderness.  Genitourinary:  GU exam, and DRE exam deferred as plan on follow-up with urologist. Will have performed there.  Musculoskeletal: Normal range of motion. He exhibits no edema or  tenderness.  Lymphadenopathy:    He has no cervical adenopathy.  Neurological: He is alert and oriented to person, place, and time. He has normal reflexes.  Skin: Skin is warm and dry.  Psychiatric: He has a normal mood and affect. His behavior is normal.  Vitals reviewed.    Filed Vitals:   10/18/14 0906  BP: 114/72  Pulse: 69  Temp: 97.2 F (36.2 C)  TempSrc: Oral  Resp: 16  Height: 5\' 9"  (1.753 m)  Weight: 194 lb 3.2 oz (88.089 kg)    Assessment & Plan:   Corey Cervantes is a 74 y.o. male Encounter for Medicare annual wellness exam  --anticipatory guidance as below in AVS, screening labs above. Health maintenance items as above in HPI discussed/recommended as applicable.   -Advised to call urologist and gastroenterologist to schedule prostate cancer screening and repeat colonoscopy.  -Slight decreased hearing, not interested in hearing aids or evaluation for this time, but if his symptoms worsen, let me know and I can give him information on audiology.  -Pulmonary screening for cognitive issue was negative. If he has any change in mentation, or difficulty with memory, advised to return to discuss this further, along with possible MMSE or other cognitive testing.  Essential hypertension - Plan: hydrochlorothiazide (MICROZIDE) 12.5 MG capsule  - May be running low at times, with his reported balance issue. If he has any dizziness or balance issues again, advised to stop medication temporarily, follow his home blood pressures to make sure there are not over 140/90, and call with an update if this occurs.  Hyperlipidemia - Plan: COMPLETE METABOLIC PANEL WITH GFR, Lipid panel, atorvastatin (LIPITOR) 10 MG tablet  -labs pending. No changes.   Prediabetes - Plan: Hemoglobin A1c  -Advised to continue exercise. A1c pending as off of metformin.  Balance problem  -As above, suspected orthostatic component. If recurs, stop BP med, but if symptoms persist off of blood pressure medicine,  return to discuss other causes. RTC/ER precautions  Difficulty hearing, unspecified laterality  - As above, call if further evaluation desired.  Chronic lymphocytic thyroiditis - Plan: TSH pending  Need for prophylactic vaccination against Streptococcus pneumoniae (pneumococcus) - Plan: Pneumococcal polysaccharide vaccine 23-valent greater than or equal to 2yo subcutaneous/IM  -Pneumovax given.   Need for influenza vaccination - Plan: Flu Vaccine QUAD 36+ mos IM  -  Flu vaccination given  Meds ordered this encounter  Medications  . atorvastatin (LIPITOR) 10 MG tablet    Sig: Take 1 tablet (10 mg total) by mouth daily.    Dispense:  90 tablet    Refill:  1  . hydrochlorothiazide (MICROZIDE) 12.5 MG capsule    Sig: TAKE ONE CAPSULE BY MOUTH ONCE DAILY.    Dispense:  90 capsule    Refill:  1   Patient Instructions  Call urology for follow up and prostate cancer screening. Call Dr. Celesta Aver office to schedule colonoscopy as this is overdue.  If lightheaded or dizzy spells return - can try to stop blood pressure medicine to see if too low. Keep a record of your blood pressures outside of the office and bring them to the next office visit.  If episodes persist off of the blood pressure medicine, or any worsening of your symptoms - return here or emergency room if needed.  Make sure you are drinking plenty of fluids, and try tylenol for neck pain as it is safer for heart and kidneys.  You should receive a call or letter about your lab results within the next week to 10 days.  2nd pneumonia vaccine and flu vaccine given today.  Let me know if you ever need resources for your hearing - especially if it worsens.    Keeping you healthy  Get these tests  Blood pressure- Have your blood pressure checked once a year by your healthcare provider.  Normal blood pressure is 120/80  Weight- Have your body mass index (BMI) calculated to screen for obesity.  BMI is a measure of body fat based on  height and weight. You can also calculate your own BMI at [email or web address].  Cholesterol- Have your cholesterol checked every year.  Diabetes- Have your blood sugar checked regularly if you have high blood pressure, high cholesterol, have a family history of diabetes or if you are overweight.  Screening for Colon Cancer- Colonoscopy starting at age 71.  Screening may begin sooner depending on your family history and other health conditions. Follow up colonoscopy as directed by your Gastroenterologist.  Screening for Prostate Cancer- Both blood work (PSA) and a rectal exam help screen for Prostate Cancer.  Screening begins at age 24 with African-American men and at age 37 with Caucasian men.  Screening may begin sooner depending on your family history.  Take these medicines  Aspirin- One aspirin daily can help prevent Heart disease and Stroke.  Flu shot- Every fall.  Tetanus- Every 10 years.  Zostavax- Once after the age of 10 to prevent Shingles.  Pneumonia shot- Once after the age of 66; if you are younger than 36, ask your healthcare provider if you need a Pneumonia shot.  Take these steps  Don't smoke- If you do smoke, talk to your doctor about quitting.  For tips on how to quit, go to www.smokefree.gov or call 1-800-QUIT-NOW.  Be physically active- Exercise 5 days a week for at least 30 minutes.  If you are not already physically active start slow and gradually work up to 30 minutes of moderate physical activity.  Examples of moderate activity include walking briskly, mowing the yard, dancing, swimming, bicycling, etc.  Eat a healthy diet- Eat a variety of healthy food such as fruits, vegetables, low fat milk, low fat cheese, yogurt, lean meant, poultry, fish, beans, tofu, etc. For more information go to www.thenutritionsource.org  Drink alcohol in moderation- Limit alcohol intake to less than two drinks a day.  Never drink and drive.  Dentist- Brush and floss twice daily;  visit your dentist twice a year.  Depression- Your emotional health is as important as your physical health. If you're feeling down, or losing interest in things you would normally enjoy please talk to your healthcare provider.  Eye exam- Visit your eye doctor every year.  Safe sex- If you may be exposed to a sexually transmitted infection, use a condom.  Seat belts- Seat belts can save your life; always wear one.  Smoke/Carbon Monoxide detectors- These detectors need to be installed on the appropriate level of your home.  Replace batteries at least once a year.  Skin cancer- When out in the sun, cover up and use sunscreen 15 SPF or higher.  Violence- If anyone is threatening you, please tell your healthcare provider. Living Will/ Health care power of attorney- Speak with your healthcare provider and family.    I personally performed the services described in this documentation, which was scribed in my presence. The recorded information has been reviewed and considered, and addended by me as needed.

## 2014-10-18 NOTE — Patient Instructions (Addendum)
Call urology for follow up and prostate cancer screening. Call Dr. Celesta Aver office to schedule colonoscopy as this is overdue.  If lightheaded or dizzy spells return - can try to stop blood pressure medicine to see if too low. Keep a record of your blood pressures outside of the office and bring them to the next office visit.  If episodes persist off of the blood pressure medicine, or any worsening of your symptoms - return here or emergency room if needed.  Make sure you are drinking plenty of fluids, and try tylenol for neck pain as it is safer for heart and kidneys.  You should receive a call or letter about your lab results within the next week to 10 days.  2nd pneumonia vaccine and flu vaccine given today.  Let me know if you ever need resources for your hearing - especially if it worsens.    Keeping you healthy  Get these tests  Blood pressure- Have your blood pressure checked once a year by your healthcare provider.  Normal blood pressure is 120/80  Weight- Have your body mass index (BMI) calculated to screen for obesity.  BMI is a measure of body fat based on height and weight. You can also calculate your own BMI at [email or web address].  Cholesterol- Have your cholesterol checked every year.  Diabetes- Have your blood sugar checked regularly if you have high blood pressure, high cholesterol, have a family history of diabetes or if you are overweight.  Screening for Colon Cancer- Colonoscopy starting at age 43.  Screening may begin sooner depending on your family history and other health conditions. Follow up colonoscopy as directed by your Gastroenterologist.  Screening for Prostate Cancer- Both blood work (PSA) and a rectal exam help screen for Prostate Cancer.  Screening begins at age 30 with African-American men and at age 57 with Caucasian men.  Screening may begin sooner depending on your family history.  Take these medicines  Aspirin- One aspirin daily can help prevent  Heart disease and Stroke.  Flu shot- Every fall.  Tetanus- Every 10 years.  Zostavax- Once after the age of 20 to prevent Shingles.  Pneumonia shot- Once after the age of 68; if you are younger than 45, ask your healthcare provider if you need a Pneumonia shot.  Take these steps  Don't smoke- If you do smoke, talk to your doctor about quitting.  For tips on how to quit, go to www.smokefree.gov or call 1-800-QUIT-NOW.  Be physically active- Exercise 5 days a week for at least 30 minutes.  If you are not already physically active start slow and gradually work up to 30 minutes of moderate physical activity.  Examples of moderate activity include walking briskly, mowing the yard, dancing, swimming, bicycling, etc.  Eat a healthy diet- Eat a variety of healthy food such as fruits, vegetables, low fat milk, low fat cheese, yogurt, lean meant, poultry, fish, beans, tofu, etc. For more information go to www.thenutritionsource.org  Drink alcohol in moderation- Limit alcohol intake to less than two drinks a day. Never drink and drive.  Dentist- Brush and floss twice daily; visit your dentist twice a year.  Depression- Your emotional health is as important as your physical health. If you're feeling down, or losing interest in things you would normally enjoy please talk to your healthcare provider.  Eye exam- Visit your eye doctor every year.  Safe sex- If you may be exposed to a sexually transmitted infection, use a condom.  Seat belts- Seat belts can  save your life; always wear one.  Smoke/Carbon Monoxide detectors- These detectors need to be installed on the appropriate level of your home.  Replace batteries at least once a year.  Skin cancer- When out in the sun, cover up and use sunscreen 15 SPF or higher.  Violence- If anyone is threatening you, please tell your healthcare provider. Living Will/ Health care power of attorney- Speak with your healthcare provider and family.

## 2014-10-18 NOTE — Progress Notes (Signed)
   Subjective:    Patient ID: Corey Cervantes, male    DOB: [birth year], 74 y.o.   MRN: [id number]  HPI    Review of Systems  Constitutional: Negative.   HENT: Positive for dental problem.   Eyes: Negative.   Respiratory: Negative.   Cardiovascular: Negative.   Gastrointestinal: Negative.   Endocrine: Negative.   Genitourinary: Positive for urgency, frequency and decreased urine volume.  Musculoskeletal: Positive for gait problem and neck stiffness.  Skin: Negative.   Allergic/Immunologic: Negative.   Neurological: Negative.   Hematological: Negative.   Psychiatric/Behavioral: Positive for decreased concentration.       Objective:   Physical Exam        Assessment & Plan:

## 2015-04-25 ENCOUNTER — Ambulatory Visit: Payer: Self-pay | Admitting: Family Medicine

## 2015-04-27 ENCOUNTER — Encounter: Payer: Self-pay | Admitting: Family Medicine

## 2015-04-27 ENCOUNTER — Ambulatory Visit (INDEPENDENT_AMBULATORY_CARE_PROVIDER_SITE_OTHER): Payer: Medicare Other | Admitting: Family Medicine

## 2015-04-27 VITALS — BP 115/74 | HR 82 | Temp 98.4°F | Resp 16 | Ht 69.0 in | Wt 200.0 lb

## 2015-04-27 DIAGNOSIS — E785 Hyperlipidemia, unspecified: Secondary | ICD-10-CM | POA: Diagnosis not present

## 2015-04-27 DIAGNOSIS — R7303 Prediabetes: Secondary | ICD-10-CM

## 2015-04-27 DIAGNOSIS — I10 Essential (primary) hypertension: Secondary | ICD-10-CM | POA: Diagnosis not present

## 2015-04-27 LAB — LIPID PANEL
CHOL/HDL RATIO: 4.9 ratio (ref <=5.0)
CHOLESTEROL: 229 mg/dL — AB (ref 125–200)
HDL: 47 mg/dL (ref >=40)
TRIGLYCERIDES: 97 mg/dL (ref <150)

## 2015-04-27 LAB — COMPLETE METABOLIC PANEL WITH GFR
ALT: 20 U/L (ref 9–46)
AST: 27 U/L (ref 10–35)
Albumin: 4.1 g/dL (ref 3.6–5.1)
Alkaline Phosphatase: 56 U/L (ref 40–115)
BILIRUBIN TOTAL: 0.9 mg/dL (ref 0.2–1.2)
BUN: 15 mg/dL (ref 7–25)
CHLORIDE: 105 mmol/L (ref 98–110)
CO2: 25 mmol/L (ref 20–31)
CREATININE: 1.14 mg/dL (ref 0.70–1.18)
Calcium: 9.2 mg/dL (ref 8.6–10.3)
GFR, Est African American: 73 mL/min (ref >=60)
GFR, Est Non African American: 63 mL/min (ref >=60)
GLUCOSE: 115 mg/dL — AB (ref 65–99)
Potassium: 3.7 mmol/L (ref 3.5–5.3)
Sodium: 139 mmol/L (ref 135–146)
TOTAL PROTEIN: 6.9 g/dL (ref 6.1–8.1)

## 2015-04-27 LAB — POCT GLYCOSYLATED HEMOGLOBIN (HGB A1C): HEMOGLOBIN A1C: 5.7

## 2015-04-27 MED ORDER — HYDROCHLOROTHIAZIDE 12.5 MG PO CAPS: ORAL_CAPSULE | ORAL | Status: DC

## 2015-04-27 NOTE — Patient Instructions (Addendum)
Continue exercise, watching diet choices. You should receive a call or letter about your lab results within the next week to 10 days, and can discuss if statin needed based on those results. Plan on physical in 6 months. Return to the clinic or go to the nearest emergency room if any of your symptoms worsen or new symptoms occur.  Follow up in 6 months.

## 2015-04-27 NOTE — Progress Notes (Signed)
Subjective:    Patient ID: Corey Cervantes, male    DOB: [birth year], 75 y.o.   MRN: [id number] By signing my name below, I, Zola Button, attest that this documentation has been prepared under the direction and in the presence of Merri Ray, MD.  Electronically Signed: Zola Button, Medical Scribe. 04/27/2015. 9:55 AM.  HPI HPI Comments: Corey Cervantes is a 75 y.o. male who presents to the [facility] for a follow-up. Patient is fasting today. His wife quit working and is around the house more. Both of his sons are in their 6s. One of his sons recently got married. He is hoping his other son will get married soon.  Hypertension: On HCTZ 12.5 mg qd. There was some question of dizziness or balance issues at last visit. Discussed holding medication if symptoms return.  Lab Results  Component Value Date   CREATININE 1.17 10/18/2014    Hyperlipidemia: On Lipitor 10 mg qd. Controlled. He stopped taking the Lipitor a few months ago and has been taking coenzyme Q10 every day and red yeast rice instead. He states he did not feel good while on the Lipitor. Lab Results  Component Value Date   CHOL 177 10/18/2014   HDL 45 10/18/2014   LDLCALC 118 10/18/2014   TRIG 68 10/18/2014   CHOLHDL 3.9 10/18/2014    Lab Results  Component Value Date   ALT 17 10/18/2014   AST 20 10/18/2014   ALKPHOS 57 10/18/2014   BILITOT 0.8 10/18/2014    Pre-diabetes: Advised to continue exercise. He had been treated with metformin previously, but recently had work on diet and exercise alone. His weight has increased since last visit. He is still exercising and does a 20-minute fast-walk for 1 mile most days. Patient denies chest pain and SOB. He has not had any red wine lately. Lab Results  Component Value Date   HGBA1C 6.2* 10/18/2014     Patient Active Problem List   Diagnosis Date Noted  . HTN (hypertension) 05/25/2013  . Other and unspecified hyperlipidemia 05/25/2013  .  Hyperglycemia 05/25/2013   Past Medical History  Diagnosis Date  . Hyperlipidemia   . Allergy   . Arthritis    Past Surgical History  Procedure Laterality Date  . Hernia repair    . Colon surgery    . Melanoma excision  1984  . Appendectomy     Allergies  Allergen Reactions  . Codeine Other (See Comments)    NIGHTMARES   Prior to Admission medications   Medication Sig Start Date End Date Taking? Authorizing Provider  aspirin EC 81 MG tablet Take 1 tablet (81 mg total) by mouth daily. 05/07/13  Yes Sherren Mocha, MD  atorvastatin (LIPITOR) 10 MG tablet Take 1 tablet (10 mg total) by mouth daily. 10/18/14  Yes Wendie Agreste, MD  b complex vitamins tablet Take 1 tablet by mouth daily.   Yes Historical Provider, MD  CALCIUM-MAGNESUIUM-ZINC 333-133-8.3 MG TABS Take by mouth daily.   Yes Historical Provider, MD  Cholecalciferol (VITAMIN D-3 PO) Take 1,000 mg by mouth daily.   Yes Historical Provider, MD  Ginkgo Biloba Extract 60 MG CAPS Take by mouth.   Yes Historical Provider, MD  hydrochlorothiazide (MICROZIDE) 12.5 MG capsule TAKE ONE CAPSULE BY MOUTH ONCE DAILY. 10/18/14  Yes Wendie Agreste, MD  Licorice, Glycyrrhiza glabra, (LICORICE PO) Take by mouth.   Yes Historical Provider, MD  OVER THE COUNTER MEDICATION    Yes Historical Provider,  MD  Saw Palmetto, Serenoa repens, (SAW PALMETTO PO) Take by mouth.   Yes Historical Provider, MD  Kava, Piper methysticum, (KAVA KAVA PO) Take by mouth. Reported on 04/27/2015    Historical Provider, MD   Social History   Social History  . Marital Status: Married    Spouse Name: N/A  . Number of Children: N/A  . Years of Education: N/A   Occupational History  . Art gallery manager    Social History Main Topics  . Smoking status: Former Smoker -- 15 years    Types: Cigarettes, Pipe, Cigars    Quit date: 02/26/1981  . Smokeless tobacco: Never Used  . Alcohol Use: No     Comment: wine with meals  . Drug Use: No  . Sexual Activity: Yes    Other Topics Concern  . Not on file   Social History Narrative     Review of Systems  Respiratory: Negative for shortness of breath.   Cardiovascular: Negative for chest pain.       Objective:   Physical Exam  Constitutional: He is oriented to person, place, and time. He appears well-developed and well-nourished.  HENT:  Head: Normocephalic and atraumatic.  Eyes: EOM are normal. Pupils are equal, round, and reactive to light.  Neck: No JVD present. Carotid bruit is not present.  Cardiovascular: Normal rate, regular rhythm and normal heart sounds.   No murmur heard. Pulmonary/Chest: Effort normal and breath sounds normal. He has no rales.  Musculoskeletal: He exhibits no edema.  Neurological: He is alert and oriented to person, place, and time.  Skin: Skin is warm and dry.  Psychiatric: He has a normal mood and affect.  Vitals reviewed.   Filed Vitals:   04/27/15 0911  BP: 115/74  Pulse: 82  Temp: 98.4 F (36.9 C)  TempSrc: Oral  Resp: 16  Height: 5\' 9"  (1.753 m)  Weight: 200 lb (90.719 kg)  SpO2: 98%    Results for orders placed or performed in visit on 04/27/15  POCT glycosylated hemoglobin (Hb A1C)  Result Value Ref Range   Hemoglobin A1C 5.7     EKG - Sinus rhythm. No apparent change from previous EKG February 2015.     Assessment & Plan:   Corey Cervantes is a 75 y.o. male Essential hypertension - Plan: EKG 12-Lead, hydrochlorothiazide (MICROZIDE) 12.5 MG capsule  - stable. No change in meds. Labs pending.   Hyperlipidemia - Plan: COMPLETE METABOLIC PANEL WITH GFR, Lipid panel, EKG 12-Lead  - lipids, CMP pending.  Consider restarting statin if levels significantly elevated.   Prediabetes - Plan: POCT glycosylated hemoglobin (Hb A1C)  -borderline. Continue adherence to diet and exercise. Recheck for physical in 6 months.   Meds ordered this encounter  Medications  . OVER THE COUNTER MEDICATION    Sig:   . co-enzyme Q-10 30 MG capsule    Sig:  Take 30 mg by mouth daily.  . hydrochlorothiazide (MICROZIDE) 12.5 MG capsule    Sig: TAKE ONE CAPSULE BY MOUTH ONCE DAILY.    Dispense:  90 capsule    Refill:  1   Patient Instructions  Continue exercise, watching diet choices. You should receive a call or letter about your lab results within the next week to 10 days, and can discuss if statin needed based on those results. Plan on physical in 6 months. Return to the clinic or go to the nearest emergency room if any of your symptoms worsen or new symptoms occur.  Follow up  in 6 months.     I personally performed the services described in this documentation, which was scribed in my presence. The recorded information has been reviewed and considered, and addended by me as needed.

## 2015-04-28 ENCOUNTER — Other Ambulatory Visit: Payer: Self-pay | Admitting: Family Medicine

## 2015-11-02 ENCOUNTER — Ambulatory Visit: Payer: Medicare Other | Admitting: Family Medicine

## 2015-11-03 ENCOUNTER — Encounter: Payer: Self-pay | Admitting: Family Medicine

## 2015-11-03 ENCOUNTER — Ambulatory Visit (INDEPENDENT_AMBULATORY_CARE_PROVIDER_SITE_OTHER): Payer: Medicare Other | Admitting: Family Medicine

## 2015-11-03 VITALS — BP 116/70 | HR 69 | Temp 97.7°F | Resp 18 | Ht 69.0 in | Wt 200.8 lb

## 2015-11-03 DIAGNOSIS — I10 Essential (primary) hypertension: Secondary | ICD-10-CM | POA: Diagnosis not present

## 2015-11-03 DIAGNOSIS — Z23 Encounter for immunization: Secondary | ICD-10-CM

## 2015-11-03 DIAGNOSIS — E785 Hyperlipidemia, unspecified: Secondary | ICD-10-CM

## 2015-11-03 DIAGNOSIS — R7303 Prediabetes: Secondary | ICD-10-CM

## 2015-11-03 LAB — COMPLETE METABOLIC PANEL WITH GFR
ALBUMIN: 4 g/dL (ref 3.6–5.1)
ALK PHOS: 54 U/L (ref 40–115)
ALT: 14 U/L (ref 9–46)
AST: 18 U/L (ref 10–35)
BILIRUBIN TOTAL: 0.8 mg/dL (ref 0.2–1.2)
BUN: 18 mg/dL (ref 7–25)
CO2: 27 mmol/L (ref 20–31)
Calcium: 9.1 mg/dL (ref 8.6–10.3)
Chloride: 107 mmol/L (ref 98–110)
Creat: 1.12 mg/dL (ref 0.70–1.18)
GFR, EST AFRICAN AMERICAN: 75 mL/min (ref >=60)
GFR, EST NON AFRICAN AMERICAN: 65 mL/min (ref >=60)
Glucose, Bld: 104 mg/dL — ABNORMAL HIGH (ref 65–99)
Potassium: 4.5 mmol/L (ref 3.5–5.3)
Sodium: 136 mmol/L (ref 135–146)
TOTAL PROTEIN: 6.9 g/dL (ref 6.1–8.1)

## 2015-11-03 LAB — LIPID PANEL
Cholesterol: 216 mg/dL — ABNORMAL HIGH (ref 125–200)
HDL: 46 mg/dL (ref >=40)
LDL Cholesterol: 156 mg/dL — ABNORMAL HIGH (ref <130)
TRIGLYCERIDES: 70 mg/dL (ref <150)
Total CHOL/HDL Ratio: 4.7 Ratio (ref <=5.0)
VLDL: 14 mg/dL (ref <30)

## 2015-11-03 NOTE — Patient Instructions (Addendum)
  We will check A1C, electrolytes and cholesterol. If cholesterol is elevated, we can look into whether a statin is needed, and discuss options other than Lipitor if needed.   Follow-up in approximately 6 months for a physical. Let me know if you have any questions or new symptoms in the meantime.  IF you received an x-ray today, you will receive an invoice from [facility]. Please contact [facility] at [phone] with questions or concerns regarding your invoice.   IF you received labwork today, you will receive an invoice from [employer]. Please contact [facility] at [phone] with questions or concerns regarding your invoice.   Our billing staff will not be able to assist you with questions regarding bills from these companies.  You will be contacted with the lab results as soon as they are available. The fastest way to get your results is to activate your My Chart account. Instructions are located on the last page of this paperwork. If you have not heard from [place] regarding the results in 2 weeks, please contact this office.

## 2015-11-03 NOTE — Progress Notes (Signed)
By signing my name below, I, Mesha Guinyard, attest that this documentation has been prepared under the direction and in the presence of Merri Ray.  Electronically Signed: Verlee Monte, Medical Scribe. 11/03/15. 9:54 AM.  Subjective:    Patient ID: Corey Cervantes, male    DOB: 03-14-1940, 75 y.o.   MRN: [id number]  HPI Chief Complaint  Patient presents with  . Follow-up    6 month follow up    HPI Comments: Corey Cervantes is a 75 y.o. male with a PMHx of HLD, preDM, and HTN who presents to the [facility] for his 6 month follow-up. Pt had a cup of black coffee this morning.  HTN: Takes HCTZ and ASA QD. Lab Results  Component Value Date   CREATININE 1.14 04/27/2015   HLD: Takes CoQ-10 and red yeast rice instead of Lipitor. Pt quit Lipitor because he felt weakness in his upper extremities, and "vague feelings" in his "heart"  While taking it and felt relief to his symptoms once he stopped taking Lipitor. Pt wasn't able to further explain the feeling in his chest, but those have not returned.  Pt denies experiencing chest pain, SOB, abdominal pain, light-headedness, HA, melena, or any other negative side effects., Including no chest pain or pressure with exercise.  Lab Results  Component Value Date   CHOL 229 (H) 04/27/2015   HDL 47 04/27/2015   LDLCALC NOT CALC 04/27/2015   TRIG 97 04/27/2015   CHOLHDL 4.9 04/27/2015   Lab Results  Component Value Date   ALT 20 04/27/2015   AST 27 04/27/2015   ALKPHOS 56 04/27/2015   BILITOT 0.9 04/27/2015   Pre-DM: Borderline non diabetic. Continued diet and exercise. Pt has quit wine, and most of his drinking. Pt hasn't been exercising as much as he used to because he's been busy with his [place] property, but reports feeling "really good" when he power walks, and his cool down lap walking up hill. Pt denies experiencing chest pain, or SOB while he exercises.  Lab Results  Component Value Date   HGBA1C 5.7  04/27/2015   Wt Readings from Last 3 Encounters:  11/03/15 200 lb 12.8 oz (91.1 kg)  04/27/15 200 lb (90.7 kg)  10/18/14 194 lb 3.2 oz (88.1 kg)   Patient Active Problem List   Diagnosis Date Noted  . HTN (hypertension) 05/25/2013  . Other and unspecified hyperlipidemia 05/25/2013  . Hyperglycemia 05/25/2013   Past Medical History:  Diagnosis Date  . Allergy   . Arthritis   . Hyperlipidemia    Past Surgical History:  Procedure Laterality Date  . APPENDECTOMY    . COLON SURGERY    . HERNIA REPAIR    . MELANOMA EXCISION  1984   Allergies  Allergen Reactions  . Codeine Other (See Comments)    NIGHTMARES   Prior to Admission medications   Medication Sig Start Date End Date Taking? Authorizing Provider  aspirin EC 81 MG tablet Take 1 tablet (81 mg total) by mouth daily. 05/07/13  Yes Sherren Mocha, MD  b complex vitamins tablet Take 1 tablet by mouth daily.   Yes Historical Provider, MD  CALCIUM-MAGNESUIUM-ZINC 333-133-8.3 MG TABS Take by mouth daily.   Yes Historical Provider, MD  Cholecalciferol (VITAMIN D-3 PO) Take 1,000 mg by mouth daily.   Yes Historical Provider, MD  co-enzyme Q-10 30 MG capsule Take 30 mg by mouth daily.   Yes Historical Provider, MD  Ginkgo Biloba Extract 60 MG CAPS  Take by mouth.   Yes Historical Provider, MD  hydrochlorothiazide (MICROZIDE) 12.5 MG capsule TAKE ONE CAPSULE BY MOUTH ONCE DAILY. 04/27/15  Yes Wendie Agreste, MD  Licorice, Glycyrrhiza glabra, (LICORICE PO) Take by mouth.   Yes Historical Provider, MD  OVER THE COUNTER MEDICATION    Yes Historical Provider, MD  Red Yeast Rice Extract (RED YEAST RICE PO) Take by mouth.   Yes Historical Provider, MD  Saw Palmetto, Serenoa repens, (SAW PALMETTO PO) Take by mouth.   Yes Historical Provider, MD  atorvastatin (LIPITOR) 10 MG tablet TAKE 1 TABLET (10 MG TOTAL) BY MOUTH DAILY. Patient not taking: Reported on 11/03/2015 04/28/15   Mancel Bale, PA-C  Kava, Piper methysticum, (KAVA KAVA PO) Take by  mouth. Reported on 04/27/2015    Historical Provider, MD   Social History   Social History  . Marital status: Married    Spouse name: N/A  . Number of children: N/A  . Years of education: N/A   Occupational History  . Art gallery manager    Social History Main Topics  . Smoking status: Former Smoker    Years: 15.00    Types: Cigarettes, Pipe, Cigars    Quit date: 02/26/1981  . Smokeless tobacco: Never Used  . Alcohol use No     Comment: wine with meals  . Drug use: No  . Sexual activity: Yes   Other Topics Concern  . Not on file   Social History Narrative  . No narrative on file   Depression screen [facility] 2/9 11/03/2015 04/27/2015 10/18/2014 04/20/2013  Decreased Interest 0 0 0 0  Down, Depressed, Hopeless 0 0 0 0  PHQ - 2 Score 0 0 0 0   Review of Systems  Constitutional: Negative for fatigue and unexpected weight change.  Eyes: Negative for visual disturbance.  Respiratory: Negative for cough, chest tightness and shortness of breath.   Cardiovascular: Negative for chest pain, palpitations and leg swelling.  Gastrointestinal: Negative for abdominal pain and blood in stool.  Neurological: Positive for weakness. Negative for dizziness, light-headedness and headaches.   Objective:  Physical Exam  Constitutional: He is oriented to person, place, and time. He appears well-developed and well-nourished.  HENT:  Head: Normocephalic and atraumatic.  Eyes: EOM are normal. Pupils are equal, round, and reactive to light.  Neck: No JVD present. Carotid bruit is not present.  Cardiovascular: Normal rate, regular rhythm and normal heart sounds.   No murmur heard. Pulmonary/Chest: Effort normal and breath sounds normal. No respiratory distress. He has no wheezes. He has no rales.  Abdominal: Soft. He exhibits no distension. There is no tenderness.  Musculoskeletal: He exhibits no edema.  Neurological: He is alert and oriented to person, place, and time.  Skin: Skin is warm and dry.    Psychiatric: He has a normal mood and affect.  Vitals reviewed.  BP 116/70   Pulse 69   Temp 97.7 F (36.5 C) (Oral)   Resp 18   Ht 5\' 9"  (1.753 m)   Wt 200 lb 12.8 oz (91.1 kg)   SpO2 97%   BMI 29.65 kg/m  Assessment & Plan:   Corey Cervantes is a 75 y.o. male Flu vaccine need - Plan: Flu Vaccine QUAD 36+ mos IM given.   Hyperlipidemia - Plan: COMPLETE METABOLIC PANEL WITH GFR, Lipid panel  - Intolerant to Lipitor, but discussed other statins may be possible for him to take. Okay to continue on red yeast Rice for now, T10. Check lipid  panel, then can discuss ASCVD risk and other options. If any return of chest symptoms or myalgias, return to discuss further. If any symptoms with exercise, ER/RTC precautions given.   Essential hypertension - Plan: COMPLETE METABOLIC PANEL WITH GFR  - Stable. No change in meds for now. Continue exercise and diet changes.  Prediabetes - Plan: Hemoglobin A1C  -Borderline prediabetic last time, commended on diet and exercise and avoidance of alcohol as this seemed to have made a big difference with his health. Repeat A1c. No meds for now.  Meds ordered this encounter  Medications  . Red Yeast Rice Extract (RED YEAST RICE PO)    Sig: Take by mouth.   Patient Instructions    We will check A1C, electrolytes and cholesterol. If cholesterol is elevated, we can look into whether a statin is needed, and discuss options other than Lipitor if needed.   Follow-up in approximately 6 months for a physical. Let me know if you have any questions or new symptoms in the meantime.  IF you received an x-ray today, you will receive an invoice from [facility]. Please contact [facility] at [phone] with questions or concerns regarding your invoice.   IF you received labwork today, you will receive an invoice from [employer]. Please contact [facility] at [phone] with questions or concerns regarding your invoice.    Our billing staff will not be able to assist you with questions regarding bills from these companies.  You will be contacted with the lab results as soon as they are available. The fastest way to get your results is to activate your My Chart account. Instructions are located on the last page of this paperwork. If you have not heard from [place] regarding the results in 2 weeks, please contact this office.        I personally performed the services described in this documentation, which was scribed in my presence. The recorded information has been reviewed and considered, and addended by me as needed.   Signed,   Merri Ray, MD [facility].  11/03/15 11:16 AM

## 2015-11-04 LAB — HEMOGLOBIN A1C
Hgb A1c MFr Bld: 5.8 % — ABNORMAL HIGH (ref <5.7)
Mean Plasma Glucose: 120 mg/dL

## 2015-11-29 ENCOUNTER — Other Ambulatory Visit: Payer: Self-pay | Admitting: Family Medicine

## 2015-11-29 DIAGNOSIS — I10 Essential (primary) hypertension: Secondary | ICD-10-CM

## 2015-11-29 DIAGNOSIS — L853 Xerosis cutis: Secondary | ICD-10-CM | POA: Diagnosis not present

## 2015-11-29 DIAGNOSIS — L570 Actinic keratosis: Secondary | ICD-10-CM | POA: Diagnosis not present

## 2015-11-29 DIAGNOSIS — L638 Other alopecia areata: Secondary | ICD-10-CM | POA: Diagnosis not present

## 2015-11-29 DIAGNOSIS — L308 Other specified dermatitis: Secondary | ICD-10-CM | POA: Diagnosis not present

## 2015-11-29 DIAGNOSIS — L821 Other seborrheic keratosis: Secondary | ICD-10-CM | POA: Diagnosis not present

## 2015-11-29 DIAGNOSIS — L218 Other seborrheic dermatitis: Secondary | ICD-10-CM | POA: Diagnosis not present

## 2015-12-01 ENCOUNTER — Encounter: Payer: Self-pay | Admitting: Family Medicine

## 2015-12-04 ENCOUNTER — Other Ambulatory Visit: Payer: Self-pay | Admitting: Family Medicine

## 2015-12-04 DIAGNOSIS — E782 Mixed hyperlipidemia: Secondary | ICD-10-CM

## 2015-12-04 MED ORDER — PRAVASTATIN SODIUM 40 MG PO TABS: 40.0000 mg | ORAL_TABLET | Freq: Every day | ORAL | 0 refills | Status: DC

## 2016-05-26 ENCOUNTER — Other Ambulatory Visit: Payer: Self-pay | Admitting: Family Medicine

## 2016-05-26 DIAGNOSIS — I10 Essential (primary) hypertension: Secondary | ICD-10-CM

## 2016-06-26 ENCOUNTER — Other Ambulatory Visit: Payer: Self-pay | Admitting: Family Medicine

## 2016-06-26 DIAGNOSIS — I10 Essential (primary) hypertension: Secondary | ICD-10-CM

## 2016-06-29 ENCOUNTER — Ambulatory Visit: Payer: Medicare Other

## 2016-07-02 ENCOUNTER — Ambulatory Visit (INDEPENDENT_AMBULATORY_CARE_PROVIDER_SITE_OTHER): Payer: Medicare Other | Admitting: Family Medicine

## 2016-07-02 ENCOUNTER — Encounter: Payer: Self-pay | Admitting: Family Medicine

## 2016-07-02 VITALS — BP 129/68 | HR 103 | Temp 98.8°F | Resp 16 | Ht 69.0 in | Wt 198.6 lb

## 2016-07-02 DIAGNOSIS — Z0000 Encounter for general adult medical examination without abnormal findings: Secondary | ICD-10-CM

## 2016-07-02 DIAGNOSIS — E785 Hyperlipidemia, unspecified: Secondary | ICD-10-CM | POA: Diagnosis not present

## 2016-07-02 DIAGNOSIS — Z125 Encounter for screening for malignant neoplasm of prostate: Secondary | ICD-10-CM

## 2016-07-02 DIAGNOSIS — Z23 Encounter for immunization: Secondary | ICD-10-CM

## 2016-07-02 DIAGNOSIS — I10 Essential (primary) hypertension: Secondary | ICD-10-CM

## 2016-07-02 DIAGNOSIS — E063 Autoimmune thyroiditis: Secondary | ICD-10-CM | POA: Diagnosis not present

## 2016-07-02 DIAGNOSIS — R351 Nocturia: Secondary | ICD-10-CM

## 2016-07-02 DIAGNOSIS — R7303 Prediabetes: Secondary | ICD-10-CM

## 2016-07-02 DIAGNOSIS — N400 Enlarged prostate without lower urinary tract symptoms: Secondary | ICD-10-CM | POA: Diagnosis not present

## 2016-07-02 MED ORDER — HYDROCHLOROTHIAZIDE 12.5 MG PO CAPS: 12.5000 mg | ORAL_CAPSULE | Freq: Every day | ORAL | 1 refills | Status: DC

## 2016-07-02 MED ORDER — ZOSTER VAC RECOMB ADJUVANTED 50 MCG/0.5ML IM SUSR: 0.5000 mL | Freq: Once | INTRAMUSCULAR | 1 refills | Status: AC

## 2016-07-02 NOTE — Progress Notes (Signed)
By signing my name below, I, Mesha Guinyard, attest that this documentation has been prepared under the direction and in the presence of Merri Ray, MD.  Electronically Signed: Verlee Monte, Medical Scribe. 07/02/16. 2:12 PM.  Subjective:    Patient ID: Corey Cervantes, male    DOB: December 23, 1940, 76 y.o.   MRN: [id number]  HPI Chief Complaint  Patient presents with  . Annual Exam    HPI Comments: Corey Cervantes is a 76 y.o. male who presents to Primary Care at [facility] for his complete physical. Pt had fasting blood work.  ROS Complaints: Urinary Frequency/Urgency: His sxs have been the same for the past years with nocturia 3-4x a night. He reports a "lack of control" but mentions he drinks lots of water. Pt is taking saw palmetto for his sxs. Pt is not being followed by a urologist. SOB: When he's doing strenuous work. He has labored breathing after going up the stairs for a while or prolonged exercise and "gets an awareness" that he needs to stop or slow down. Denies chest tightness, and chest pain during these episodes. Neck Pain/Stiffness: Reports having arthritis. Knee Pain: Reports knee pain during market and recalls popping from his knee so he used a knee brace to stabilize it.plans on follow up to discuss further.  Agitation: Suspects it from getting old. He and his wife are now spending all their time together and at times annoy each other. Denies SI, thoughts of self harm, and HI. Seasonal Allergies: Reports associated sxs of sinus pressure and eye itching.  HTN: He has seen cardiology in the past. Nl stress test 04/2013. Takes HCTZ QD and ASA. Lab Results  Component Value Date   CREATININE 1.12 11/03/2015   Chronic Lymphocystis sinusitis: Pt was told to monitor his TSH when met with specialist in past.  Lab Results  Component Value Date   TSH 2.215 10/18/2014   Pre-DM: He was treated with metformin in the past but was stopped so he could diet and exercise. Lab  Results  Component Value Date   HGBA1C 5.8 (H) 11/03/2015   Wt Readings from Last 3 Encounters:  07/02/16 198 lb 9.6 oz (90.1 kg)  11/03/15 200 lb 12.8 oz (91.1 kg)  04/27/15 200 lb (90.7 kg)   Decreased Hearing: Discussed at previous visit. He was not interested in hearing aids or evaluation at that time.  HLD: He did not tolerate lipitor or pravachol. He was taking the CoQ-10 and red yeast rice when discussed in 2017. Discussed trying pravachol instead of continuing red yeast rice.  Pt states he felt worse after taking pravachol so he discontinued it shortly after beginning regimen. He reports feeling arthralgias, weakness, myalgias, feeling "run down and bad" while on pravachol. Lab Results  Component Value Date   CHOL 216 (H) 11/03/2015   HDL 46 11/03/2015   LDLCALC 156 (H) 11/03/2015   TRIG 70 11/03/2015   CHOLHDL 4.7 11/03/2015   Lab Results  Component Value Date   ALT 14 11/03/2015   AST 18 11/03/2015   ALKPHOS 54 11/03/2015   BILITOT 0.8 11/03/2015   Cancer Screening: Prostate: Elevated PSA referred to urology. Possible BPH. Advised to call urologist at last physical. Pt plans on following-up with urologist. No results found for: PSA   Colon: Colonoscopy in 2009; Dr. Carlean Purl. 2 polyp removed and diverticula. He did have an adenomatous polyp, recommend repeat within few years 2 years. Had not had it scheduled in his last physical in 2015. Pt  has not scheduled a colonoscopy yet, but plans on scheduling one with Dr. Carlean Purl now that's he's not as busy. Skin:  Immunizations: Immunization History  Administered Date(s) Administered  . Influenza Split 12/24/2011  . Influenza,inj,Quad PF,36+ Mos 04/20/2013, 10/18/2014, 11/03/2015  . Pneumococcal Conjugate-13 08/17/2013  . Pneumococcal Polysaccharide-23 10/18/2014  . Zoster 09/27/2014   Vision: Pt is not followed by an ophthalmologist and his last appt was a couple of years ago. The pressure in his eye have always been  fine.  Visual Acuity Screening   Right eye Left eye Both eyes  Without correction:     With correction: '20/30 20/40 20/30 '   Dentist: Pt is not followed by a dentist and he has all natural teeth.  Exercise: Pt was putting furniture in the attic before coming in today. Pt's knee has been limiting his exercise.  Depression Screening: Pt started oil painting again  Depression screen [facility] 2/9 07/02/2016 11/03/2015 04/27/2015 10/18/2014 04/20/2013  Decreased Interest 0 0 0 0 0  Down, Depressed, Hopeless 0 0 0 0 0  PHQ - 2 Score 0 0 0 0 0   Advance Directives: Advanced Directives 07/02/2016  Does Patient Have a Medical Advance Directive? Yes  Type of Advance Directive Living will;Healthcare Power of [place] in Chart? No - copy requested  Would patient like information on creating a medical advance directive? -   Fall Screening: Fall Risk  07/02/2016 11/03/2015 04/27/2015 10/18/2014 04/20/2013  Falls in the past year? No No No No No   Functional Status Survey: Is the patient deaf or have difficulty hearing?: No Does the patient have difficulty seeing, even when wearing glasses/contacts?: No Does the patient have difficulty concentrating, remembering, or making decisions?: No Does the patient have difficulty walking or climbing stairs?: Yes (occasionally - per patient drifting off his path) Does the patient have difficulty dressing or bathing?: No Does the patient have difficulty doing errands alone such as visiting a doctor's office or shopping?: No   Patient Active Problem List   Diagnosis Date Noted  . HTN (hypertension) 05/25/2013  . Other and unspecified hyperlipidemia 05/25/2013  . Hyperglycemia 05/25/2013   Past Medical History:  Diagnosis Date  . Allergy   . Arthritis   . Hyperlipidemia    Past Surgical History:  Procedure Laterality Date  . APPENDECTOMY    . COLON SURGERY    . HERNIA REPAIR    . MELANOMA EXCISION  1984   Allergies  Allergen  Reactions  . Codeine Other (See Comments)    NIGHTMARES   Prior to Admission medications   Medication Sig Start Date End Date Taking? Authorizing Provider  aspirin EC 81 MG tablet Take 1 tablet (81 mg total) by mouth daily. 05/07/13   Sherren Mocha, MD  atorvastatin (LIPITOR) 10 MG tablet TAKE 1 TABLET (10 MG TOTAL) BY MOUTH DAILY. Patient not taking: Reported on 11/03/2015 04/28/15   Mancel Bale, PA-C  b complex vitamins tablet Take 1 tablet by mouth daily.    [provider]  CALCIUM-MAGNESUIUM-ZINC 333-133-8.3 MG TABS Take by mouth daily.    [provider]  Cholecalciferol (VITAMIN D-3 PO) Take 1,000 mg by mouth daily.    [provider]  co-enzyme Q-10 30 MG capsule Take 30 mg by mouth daily.    [provider]  Ginkgo Biloba Extract 60 MG CAPS Take by mouth.    [provider]  hydrochlorothiazide (MICROZIDE) 12.5 MG capsule TAKE ONE CAPSULE BY  MOUTH EVERY DAY 06/28/16   Wendie Agreste, MD  Kava, Piper methysticum, (KAVA KAVA PO) Take by mouth. Reported on 04/27/2015    [provider]  Licorice, Glycyrrhiza glabra, (LICORICE PO) Take by mouth.    [provider]  OVER THE COUNTER MEDICATION     [provider]  pravastatin (PRAVACHOL) 40 MG tablet Take 1 tablet (40 mg total) by mouth daily. 12/04/15   Wendie Agreste, MD  Red Yeast Rice Extract (RED YEAST RICE PO) Take by mouth.    [provider]  Saw Palmetto, Serenoa repens, (SAW PALMETTO PO) Take by mouth.    [provider]   Social History   Social History  . Marital status: Married    Spouse name: N/A  . Number of children: N/A  . Years of education: N/A   Occupational History  . Art gallery manager    Social History Main Topics  . Smoking status: Former Smoker    Years: 15.00    Types: Cigarettes, Pipe, Cigars    Quit date: 02/26/1981  . Smokeless tobacco: Never Used  . Alcohol use No     Comment: wine with meals  . Drug use:  No  . Sexual activity: Yes   Other Topics Concern  . Not on file   Social History Narrative  . No narrative on file   Review of Systems  HENT: Positive for sinus pressure.   Eyes: Positive for itching.  Respiratory: Positive for shortness of breath.   Endocrine: Positive for polyuria.  Genitourinary: Positive for difficulty urinating and frequency.  Musculoskeletal: Positive for back pain, gait problem and neck stiffness.  Allergic/Immunologic: Positive for environmental allergies.  Psychiatric/Behavioral: Positive for agitation.  13 point ROS positive for the above. Objective:  Physical Exam  Constitutional: He is oriented to person, place, and time. He appears well-developed and well-nourished.  HENT:  Head: Normocephalic and atraumatic.  Right Ear: External ear normal.  Left Ear: External ear normal.  Mouth/Throat: Oropharynx is clear and moist.  Eyes: Conjunctivae and EOM are normal. Pupils are equal, round, and reactive to light.  Neck: Normal range of motion. Neck supple. No thyromegaly present.  Cardiovascular: Normal rate, regular rhythm, normal heart sounds and intact distal pulses.   Pulmonary/Chest: Effort normal and breath sounds normal. No respiratory distress. He has no wheezes.  Abdominal: Soft. He exhibits no distension. There is no tenderness.  Genitourinary:  Genitourinary Comments: dre deferred as plans on follow up with urology.   Musculoskeletal: Normal range of motion. He exhibits no edema or tenderness.  Lymphadenopathy:    He has no cervical adenopathy.  Neurological: He is alert and oriented to person, place, and time. He has normal reflexes.  Skin: Skin is warm and dry.  Psychiatric: He has a normal mood and affect. His behavior is normal.  Vitals reviewed.    Vitals:   07/02/16 1350  BP: 129/68  Pulse: (!) 103  Resp: 16  Temp: 98.8 F (37.1 C)  TempSrc: Oral  SpO2: 96%  Weight: 198 lb 9.6 oz (90.1 kg)  Height: '5\' 9"'  (1.753 m)   Body  mass index is 29.33 kg/m. Assessment & Plan:   KALAB CAMPS is a 76 y.o. male Medicare annual wellness visit, subsequent  - anticipatory guidance as below in AVS, screening labs if needed. Health maintenance items as above in HPI discussed/recommended as applicable.   - no concerning responses on depression, fall, or functional status screening. Any positive responses noted as  above. Advanced directives discussed as in CHL.   -call GI to schedule colonoscopy  Essential hypertension - Plan: Comprehensive metabolic panel, hydrochlorothiazide (MICROZIDE) 12.5 MG capsule  - stable, cont HCTZ same dose for now. Labs pending.   Nocturia Screening for prostate cancer - Plan: PSA Benign prostatic hyperplasia, unspecified whether lower urinary tract symptoms present - Plan: PSA  - will check PSA, plans to follow up with urologist.   Hyperlipidemia, unspecified hyperlipidemia type - Plan: Lipid panel  - intolerant to multiple statins now. Continued on red yeast rice, check lipid panel and could consider zetia.  Need for shingles vaccine - Plan: Zoster Vac Recomb Adjuvanted ([place]) injection  - shingrix rx   Chronic lymphocytic thyroiditis - Plan: TSH  -check TSH  Prediabetes - Plan: Hemoglobin A1c, Comprehensive metabolic panel  - exercise/diet discussed. If any increased fatigue or dysenea with exercise, would recommend follow up with cardiology. Rt precautions.   Plans to follow up to discuss knee pain, other ongoing issues to allow more time to discuss.   Meds ordered this encounter  Medications  . Zoster Vac Recomb Adjuvanted [facility]) injection    Sig: Inject 0.5 mLs into the muscle once. Repeat injection once in 2-6 months.    Dispense:  0.5 mL    Refill:  1  . hydrochlorothiazide (MICROZIDE) 12.5 MG capsule    Sig: Take 1 capsule (12.5 mg total) by mouth daily.    Dispense:  90 capsule    Refill:  1   Patient Instructions   Call Dr Carlean Purl for follow up colonoscopy:  [phone]  I will recheck your prostate test today, but you need to schedule appointment with urology. [facility]:  [phone]  Schedule follow up with your eye care provider and dentist.  I will recheck your cholesterol, then can follow up to discuss options.  If you do have shortness of breath or any chest symptoms with exertion, I would recommend following up with your cardiologist - Dr. Burt Knack. You may want to schedule a follow up appointment regardless.   Please follow up to discuss neck pain, knee pain, back pain further. Return to the clinic or go to the nearest emergency room if any of your symptoms worsen or new symptoms occur.  I would also like you to follow up to discuss agitation further.    Keeping you healthy  Get these tests  Blood pressure- Have your blood pressure checked once a year by your healthcare provider.  Normal blood pressure is 120/80  Weight- Have your body mass index (BMI) calculated to screen for obesity.  BMI is a measure of body fat based on height and weight. You can also calculate your own BMI at [email or web address].  Cholesterol- Have your cholesterol checked every year.  Diabetes- Have your blood sugar checked regularly if you have high blood pressure, high cholesterol, have a family history of diabetes or if you are overweight.  Screening for Colon Cancer- Colonoscopy starting at age 40.  Screening may begin sooner depending on your family history and other health conditions. Follow up colonoscopy as directed by your Gastroenterologist.  Screening for Prostate Cancer- Both blood work (PSA) and a rectal exam help screen for Prostate Cancer.  Screening begins at age 62 with African-American men and at age 39 with Caucasian men.  Screening may begin sooner depending on your family history.  Take these medicines  Aspirin- One aspirin daily can help prevent Heart disease and Stroke.  Flu shot- Every fall.  Tetanus- Every 10  years.  Zostavax- Once after the age of 81 to prevent Shingles.  Pneumonia shot- Once after the age of 38; if you are younger than 31, ask your healthcare provider if you need a Pneumonia shot.  Take these steps  Don't smoke- If you do smoke, talk to your doctor about quitting.  For tips on how to quit, go to www.smokefree.gov or call 1-800-QUIT-NOW.  Be physically active- Exercise 5 days a week for at least 30 minutes.  If you are not already physically active start slow and gradually work up to 30 minutes of moderate physical activity.  Examples of moderate activity include walking briskly, mowing the yard, dancing, swimming, bicycling, etc.  Eat a healthy diet- Eat a variety of healthy food such as fruits, vegetables, low fat milk, low fat cheese, yogurt, lean meant, poultry, fish, beans, tofu, etc. For more information go to www.thenutritionsource.org  Drink alcohol in moderation- Limit alcohol intake to less than two drinks a day. Never drink and drive.  Dentist- Brush and floss twice daily; visit your dentist twice a year.  Depression- Your emotional health is as important as your physical health. If you're feeling down, or losing interest in things you would normally enjoy please talk to your healthcare provider.  Eye exam- Visit your eye doctor every year.  Safe sex- If you may be exposed to a sexually transmitted infection, use a condom.  Seat belts- Seat belts can save your life; always wear one.  Smoke/Carbon Monoxide detectors- These detectors need to be installed on the appropriate level of your home.  Replace batteries at least once a year.  Skin cancer- When out in the sun, cover up and use sunscreen 15 SPF or higher.  Violence- If anyone is threatening you, please tell your healthcare provider.  Living Will/ Health care power of attorney- Speak with your healthcare provider and family.   Keeping you healthy  Get these tests  Blood pressure- Have your blood  pressure checked once a year by your healthcare provider.  Normal blood pressure is 120/80  Weight- Have your body mass index (BMI) calculated to screen for obesity.  BMI is a measure of body fat based on height and weight. You can also calculate your own BMI at ViewBanking.[email or web address].  Cholesterol- Have your cholesterol checked every year.  Diabetes- Have your blood sugar checked regularly if you have high blood pressure, high cholesterol, have a family history of diabetes or if you are overweight.  Screening for Colon Cancer- Colonoscopy starting at age 71.  Screening may begin sooner depending on your family history and other health conditions. Follow up colonoscopy as directed by your Gastroenterologist.  Screening for Prostate Cancer- Both blood work (PSA) and a rectal exam help screen for Prostate Cancer.  Screening begins at age 14 with African-American men and at age 25 with Caucasian men.  Screening may begin sooner depending on your family history.  Take these medicines  Aspirin- One aspirin daily can help prevent Heart disease and Stroke.  Flu shot- Every fall.  Tetanus- Every 10 years.  Zostavax- Once after the age of 82 to prevent Shingles.  Pneumonia shot- Once after the age of 67; if you are younger than 12, ask your healthcare provider if you need a Pneumonia shot.  Take these steps  Don't smoke- If you do smoke, talk to your doctor about quitting.  For tips on how to quit, go to www.smokefree.gov or call 1-800-QUIT-NOW.  Be physically active- Exercise 5 days a week for  at least 30 minutes.  If you are not already physically active start slow and gradually work up to 30 minutes of moderate physical activity.  Examples of moderate activity include walking briskly, mowing the yard, dancing, swimming, bicycling, etc.  Eat a healthy diet- Eat a variety of healthy food such as fruits, vegetables, low fat milk, low fat cheese, yogurt, lean meant, poultry, fish, beans, tofu,  etc. For more information go to www.thenutritionsource.org  Drink alcohol in moderation- Limit alcohol intake to less than two drinks a day. Never drink and drive.  Dentist- Brush and floss twice daily; visit your dentist twice a year.  Depression- Your emotional health is as important as your physical health. If you're feeling down, or losing interest in things you would normally enjoy please talk to your healthcare provider.  Eye exam- Visit your eye doctor every year.  Safe sex- If you may be exposed to a sexually transmitted infection, use a condom.  Seat belts- Seat belts can save your life; always wear one.  Smoke/Carbon Monoxide detectors- These detectors need to be installed on the appropriate level of your home.  Replace batteries at least once a year.  Skin cancer- When out in the sun, cover up and use sunscreen 15 SPF or higher.  Violence- If anyone is threatening you, please tell your healthcare provider.  Living Will/ Health care power of attorney- Speak with your healthcare provider and family.    IF you received an x-ray today, you will receive an invoice from [facility]. Please contact [facility] at [phone] with questions or concerns regarding your invoice.   IF you received labwork today, you will receive an invoice from [facility]. Please contact [facility] at [phone] with questions or concerns regarding your invoice.   Our billing staff will not be able to assist you with questions regarding bills from these companies.  You will be contacted with the lab results as soon as they are available. The fastest way to get your results is to activate your My Chart account. Instructions are located on the last page of this paperwork. If you have not heard from [place] regarding the results in 2 weeks, please contact this office.       I personally performed the services described in this documentation, which was scribed in my presence. The recorded  information has been reviewed and considered for accuracy and completeness, addended by me as needed, and agree with information above.  Signed,   Merri Ray, MD [facility] at [place].  07/04/16 1:32 PM

## 2016-07-02 NOTE — Patient Instructions (Addendum)
Call Dr Carlean Purl for follow up colonoscopy: [phone]  I will recheck your prostate test today, but you need to schedule appointment with urology. [facility]:  [phone]  Schedule follow up with your eye care provider and dentist.  I will recheck your cholesterol, then can follow up to discuss options.  If you do have shortness of breath or any chest symptoms with exertion, I would recommend following up with your cardiologist - Dr. Burt Knack. You may want to schedule a follow up appointment regardless.   Please follow up to discuss neck pain, knee pain, back pain further. Return to the clinic or go to the nearest emergency room if any of your symptoms worsen or new symptoms occur.  I would also like you to follow up to discuss agitation further.    Keeping you healthy  Get these tests  Blood pressure- Have your blood pressure checked once a year by your healthcare provider.  Normal blood pressure is 120/80  Weight- Have your body mass index (BMI) calculated to screen for obesity.  BMI is a measure of body fat based on height and weight. You can also calculate your own BMI at ViewBanking.[email or web address].  Cholesterol- Have your cholesterol checked every year.  Diabetes- Have your blood sugar checked regularly if you have high blood pressure, high cholesterol, have a family history of diabetes or if you are overweight.  Screening for Colon Cancer- Colonoscopy starting at age 110.  Screening may begin sooner depending on your family history and other health conditions. Follow up colonoscopy as directed by your Gastroenterologist.  Screening for Prostate Cancer- Both blood work (PSA) and a rectal exam help screen for Prostate Cancer.  Screening begins at age 89 with African-American men and at age 58 with Caucasian men.  Screening may begin sooner depending on your family history.  Take these medicines  Aspirin- One aspirin daily can help prevent Heart disease and Stroke.  Flu shot- Every  fall.  Tetanus- Every 10 years.  Zostavax- Once after the age of 48 to prevent Shingles.  Pneumonia shot- Once after the age of 18; if you are younger than 79, ask your healthcare provider if you need a Pneumonia shot.  Take these steps  Don't smoke- If you do smoke, talk to your doctor about quitting.  For tips on how to quit, go to www.smokefree.gov or call 1-800-QUIT-NOW.  Be physically active- Exercise 5 days a week for at least 30 minutes.  If you are not already physically active start slow and gradually work up to 30 minutes of moderate physical activity.  Examples of moderate activity include walking briskly, mowing the yard, dancing, swimming, bicycling, etc.  Eat a healthy diet- Eat a variety of healthy food such as fruits, vegetables, low fat milk, low fat cheese, yogurt, lean meant, poultry, fish, beans, tofu, etc. For more information go to www.thenutritionsource.org  Drink alcohol in moderation- Limit alcohol intake to less than two drinks a day. Never drink and drive.  Dentist- Brush and floss twice daily; visit your dentist twice a year.  Depression- Your emotional health is as important as your physical health. If you're feeling down, or losing interest in things you would normally enjoy please talk to your healthcare provider.  Eye exam- Visit your eye doctor every year.  Safe sex- If you may be exposed to a sexually transmitted infection, use a condom.  Seat belts- Seat belts can save your life; always wear one.  Smoke/Carbon Monoxide detectors- These detectors need to be installed  on the appropriate level of your home.  Replace batteries at least once a year.  Skin cancer- When out in the sun, cover up and use sunscreen 15 SPF or higher.  Violence- If anyone is threatening you, please tell your healthcare provider.  Living Will/ Health care power of attorney- Speak with your healthcare provider and family.   Keeping you healthy  Get these tests  Blood  pressure- Have your blood pressure checked once a year by your healthcare provider.  Normal blood pressure is 120/80  Weight- Have your body mass index (BMI) calculated to screen for obesity.  BMI is a measure of body fat based on height and weight. You can also calculate your own BMI at ViewBanking.si.  Cholesterol- Have your cholesterol checked every year.  Diabetes- Have your blood sugar checked regularly if you have high blood pressure, high cholesterol, have a family history of diabetes or if you are overweight.  Screening for Colon Cancer- Colonoscopy starting at age 65.  Screening may begin sooner depending on your family history and other health conditions. Follow up colonoscopy as directed by your Gastroenterologist.  Screening for Prostate Cancer- Both blood work (PSA) and a rectal exam help screen for Prostate Cancer.  Screening begins at age 22 with African-American men and at age 50 with Caucasian men.  Screening may begin sooner depending on your family history.  Take these medicines  Aspirin- One aspirin daily can help prevent Heart disease and Stroke.  Flu shot- Every fall.  Tetanus- Every 10 years.  Zostavax- Once after the age of 13 to prevent Shingles.  Pneumonia shot- Once after the age of 40; if you are younger than 23, ask your healthcare provider if you need a Pneumonia shot.  Take these steps  Don't smoke- If you do smoke, talk to your doctor about quitting.  For tips on how to quit, go to www.smokefree.gov or call 1-800-QUIT-NOW.  Be physically active- Exercise 5 days a week for at least 30 minutes.  If you are not already physically active start slow and gradually work up to 30 minutes of moderate physical activity.  Examples of moderate activity include walking briskly, mowing the yard, dancing, swimming, bicycling, etc.  Eat a healthy diet- Eat a variety of healthy food such as fruits, vegetables, low fat milk, low fat cheese, yogurt, lean meant,  poultry, fish, beans, tofu, etc. For more information go to www.thenutritionsource.org  Drink alcohol in moderation- Limit alcohol intake to less than two drinks a day. Never drink and drive.  Dentist- Brush and floss twice daily; visit your dentist twice a year.  Depression- Your emotional health is as important as your physical health. If you're feeling down, or losing interest in things you would normally enjoy please talk to your healthcare provider.  Eye exam- Visit your eye doctor every year.  Safe sex- If you may be exposed to a sexually transmitted infection, use a condom.  Seat belts- Seat belts can save your life; always wear one.  Smoke/Carbon Monoxide detectors- These detectors need to be installed on the appropriate level of your home.  Replace batteries at least once a year.  Skin cancer- When out in the sun, cover up and use sunscreen 15 SPF or higher.  Violence- If anyone is threatening you, please tell your healthcare provider.  Living Will/ Health care power of attorney- Speak with your healthcare provider and family.    IF you received an x-ray today, you will receive an invoice from [facility].  Please contact [facility] at [phone] with questions or concerns regarding your invoice.   IF you received labwork today, you will receive an invoice from [facility]. Please contact [facility] at [phone] with questions or concerns regarding your invoice.   Our billing staff will not be able to assist you with questions regarding bills from these companies.  You will be contacted with the lab results as soon as they are available. The fastest way to get your results is to activate your My Chart account. Instructions are located on the last page of this paperwork. If you have not heard from [place] regarding the results in 2 weeks, please contact this office.

## 2016-07-03 LAB — LIPID PANEL
Chol/HDL Ratio: 4.8 ratio (ref 0.0–5.0)
Cholesterol, Total: 247 mg/dL — ABNORMAL HIGH (ref 100–199)
HDL: 52 mg/dL (ref >39)
LDL CALC: 175 mg/dL — AB (ref 0–99)
Triglycerides: 98 mg/dL (ref 0–149)
VLDL CHOLESTEROL CAL: 20 mg/dL (ref 5–40)

## 2016-07-03 LAB — COMPREHENSIVE METABOLIC PANEL
ALT: 13 IU/L (ref 0–44)
AST: 20 IU/L (ref 0–40)
Albumin/Globulin Ratio: 1.7 (ref 1.2–2.2)
Albumin: 4.3 g/dL (ref 3.5–4.8)
Alkaline Phosphatase: 63 IU/L (ref 39–117)
BUN/Creatinine Ratio: 14 (ref 10–24)
BUN: 15 mg/dL (ref 8–27)
Bilirubin Total: 0.6 mg/dL (ref 0.0–1.2)
CO2: 24 mmol/L (ref 18–29)
Calcium: 9 mg/dL (ref 8.6–10.2)
Chloride: 102 mmol/L (ref 96–106)
Creatinine, Ser: 1.08 mg/dL (ref 0.76–1.27)
GFR, EST AFRICAN AMERICAN: 78 mL/min/{1.73_m2} (ref >59)
GFR, EST NON AFRICAN AMERICAN: 67 mL/min/{1.73_m2} (ref >59)
GLOBULIN, TOTAL: 2.5 g/dL (ref 1.5–4.5)
GLUCOSE: 113 mg/dL — AB (ref 65–99)
POTASSIUM: 4.4 mmol/L (ref 3.5–5.2)
SODIUM: 142 mmol/L (ref 134–144)
Total Protein: 6.8 g/dL (ref 6.0–8.5)

## 2016-07-03 LAB — TSH: TSH: 3.63 u[IU]/mL (ref 0.450–4.500)

## 2016-07-03 LAB — PSA: Prostate Specific Ag, Serum: 3.3 ng/mL (ref 0.0–4.0)

## 2016-07-03 LAB — HEMOGLOBIN A1C
Est. average glucose Bld gHb Est-mCnc: 128 mg/dL
Hgb A1c MFr Bld: 6.1 % — ABNORMAL HIGH (ref 4.8–5.6)

## 2016-07-05 ENCOUNTER — Encounter: Payer: Self-pay | Admitting: Internal Medicine

## 2016-07-12 ENCOUNTER — Other Ambulatory Visit: Payer: Self-pay | Admitting: Family Medicine

## 2016-07-12 DIAGNOSIS — I10 Essential (primary) hypertension: Secondary | ICD-10-CM

## 2016-08-02 ENCOUNTER — Encounter: Payer: Self-pay | Admitting: Family Medicine

## 2016-08-02 ENCOUNTER — Ambulatory Visit (INDEPENDENT_AMBULATORY_CARE_PROVIDER_SITE_OTHER): Payer: Medicare Other

## 2016-08-02 ENCOUNTER — Ambulatory Visit (INDEPENDENT_AMBULATORY_CARE_PROVIDER_SITE_OTHER): Payer: Medicare Other | Admitting: Family Medicine

## 2016-08-02 VITALS — BP 112/68 | HR 98 | Temp 97.0°F | Resp 18 | Ht 70.47 in | Wt 196.0 lb

## 2016-08-02 DIAGNOSIS — E7800 Pure hypercholesterolemia, unspecified: Secondary | ICD-10-CM

## 2016-08-02 DIAGNOSIS — M25561 Pain in right knee: Secondary | ICD-10-CM

## 2016-08-02 DIAGNOSIS — M1711 Unilateral primary osteoarthritis, right knee: Secondary | ICD-10-CM | POA: Diagnosis not present

## 2016-08-02 MED ORDER — DICLOFENAC SODIUM 1 % TD GEL: 4.0000 g | Freq: Four times a day (QID) | TRANSDERMAL | 2 refills | Status: DC

## 2016-08-02 NOTE — Progress Notes (Signed)
Subjective:  This chart was scribed for Corey Agreste, MD by Tamsen Roers, at [place] at [facility].  This patient was seen in room 26 and the patient's care was started at 12:11 PM.   Chief Complaint  Patient presents with  . Knee Pain    right knee x 1 year now making a popping sound.  Causing more pain now and unsteadness,      Patient ID: Corey Cervantes, male    DOB: [birth year], 76 y.o.   MRN: [id number]  HPI HPI Comments: Corey Cervantes is a 76 y.o. male who presents to Primary Care at [facility] complaining of right knee pain and is here for a follow up.  He was last seen May 7th for a wellness visit but did have some other acute concerns.  He was asked to follow up to discuss those in more detail- Including neck pain, back pain and knee pain.   Patient no longer drinks wine and will be "dry" for three years.     Hyperlipidemia:He has some difficulty tolerating Lipitor and Pravachol.  Discussed possible red yeast rice or different statin along with diet and exercise. -----  Patient states that many of his aches and pains have subsided after stopping the statins.  He has been able to start exercising again/started changing his eating habits. He believes that this will have a big effect on his cholesterol. He has also been taking coQ 10 and red rice yeast. Lab Results  Component Value Date   CHOL 247 (H) 07/02/2016   HDL 52 07/02/2016   LDLCALC 175 (H) 07/02/2016   TRIG 98 07/02/2016   CHOLHDL 4.8 07/02/2016     Knee pain: Patient states that he is having a popping sensation mainly in his right knee (around the patella area) with lateral movements and occasionally in the left as well.  He had swelling the last time this occurred and had to take a cane to work as he was not able to bare weight on it.  He purchased a brace which he felt like helped him and allowed his knee to "heal up some".  He has never had his knee x-rayed in the past.  He has been having knee pain for  about a year or so. Patient denies any neck/back soreness currently but it does bother him "once in a while" as he was told he has some arthritis. Patient would take 3 Advils at night time (rarely- about once a month) when his knee pain would start and states that he would wake up with "no pain at all".  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   He is setting up his appointment for his colonoscopy, will be seeing his urologist, get an appointment with cardiology and dentist.    Patient Active Problem List   Diagnosis Date Noted  . HTN (hypertension) 05/25/2013  . Other and unspecified hyperlipidemia 05/25/2013  . Hyperglycemia 05/25/2013   Past Medical History:  Diagnosis Date  . Allergy   . Arthritis   . Hyperlipidemia    Past Surgical History:  Procedure Laterality Date  . APPENDECTOMY    . COLON SURGERY    . HERNIA REPAIR    . MELANOMA EXCISION  1984   Allergies  Allergen Reactions  . Codeine Other (See Comments)    NIGHTMARES  . Lipitor [Atorvastatin] Other (See Comments)    Malaise, myalgia.   Marland Kitchen  Pravastatin Other (See Comments)    Myalgia, malaise.    Prior to Admission medications   Medication Sig Start Date End Date Taking? Authorizing Provider  aspirin EC 81 MG tablet Take 1 tablet (81 mg total) by mouth daily. 05/07/13  Yes Sherren Mocha, MD  b complex vitamins tablet Take 1 tablet by mouth daily.   Yes [provider]  CALCIUM-MAGNESUIUM-ZINC 333-133-8.3 MG TABS Take by mouth daily.   Yes [provider]  Cholecalciferol (VITAMIN D-3 PO) Take 1,000 mg by mouth daily.   Yes [provider]  co-enzyme Q-10 30 MG capsule Take 30 mg by mouth daily.   Yes [provider]  Ginkgo Biloba Extract 60 MG CAPS Take by mouth.   Yes [provider]  hydrochlorothiazide (MICROZIDE) 12.5 MG capsule Take 1 capsule (12.5 mg total) by mouth daily. 07/02/16  Yes  Corey Agreste, MD  hydrochlorothiazide (MICROZIDE) 12.5 MG capsule TAKE ONE CAPSULE BY MOUTH EVERY DAY (NEED OFFICE VISIT FOR MORE REFILLS) 07/13/16  Yes Corey Agreste, MD  Kava, Piper methysticum, (KAVA KAVA PO) Take by mouth. Reported on 04/27/2015   Yes [provider]  Licorice, Glycyrrhiza glabra, (LICORICE PO) Take by mouth.   Yes [provider]  OVER THE COUNTER MEDICATION    Yes [provider]  Red Yeast Rice Extract (RED YEAST RICE PO) Take by mouth.   Yes [provider]  Saw Palmetto, Serenoa repens, (SAW PALMETTO PO) Take by mouth.   Yes [provider]   Social History   Social History  . Marital status: Married    Spouse name: N/A  . Number of children: N/A  . Years of education: N/A   Occupational History  . Art gallery manager    Social History Main Topics  . Smoking status: Former Smoker    Years: 15.00    Types: Cigarettes, Pipe, Cigars    Quit date: 02/26/1981  . Smokeless tobacco: Never Used  . Alcohol use No     Comment: wine with meals  . Drug use: No  . Sexual activity: Yes   Other Topics Concern  . Not on file   Social History Narrative  . No narrative on file      Review of Systems  Constitutional: Negative for chills and fever.  Eyes: Negative for pain, redness and itching.  Respiratory: Negative for cough, choking and shortness of breath.   Gastrointestinal: Negative for nausea and vomiting.  Musculoskeletal: Negative for neck pain and neck stiffness.       Knee pain  Neurological: Negative for speech difficulty.       Objective:   Physical Exam  Constitutional: He is oriented to person, place, and time. He appears well-developed and well-nourished.  HENT:  Head: Normocephalic and atraumatic.  Eyes: Pupils are equal, round, and reactive to light.  Pulmonary/Chest: Effort normal. No respiratory distress.  Musculoskeletal:  Right knee- possible trace effusion.  Patella over all is non  tender, Minimal lateral joint line tenderness but primarily medial joint line tenderness, Slight disc at distal patellar tendon, Full ROM, negative varus, negative valgus, slightly guarded exam but negative Lachman, positive mcmurray with pain medially, and crepitance.   Neurological: He is alert and oriented to person, place, and time.  Skin: Skin is warm and dry.  Psychiatric: He has a normal mood and affect.    Vitals:   08/02/16 1143  BP: 112/68  Pulse: 98  Resp: 18  Temp: 97 F (36.1 C)  TempSrc: Oral  SpO2: 98%  Weight: 196 lb (88.9 kg)  Height: 5' 10.47" (1.79 m)   Dg Knee Complete 4 Views Right  Result Date: 08/02/2016 CLINICAL DATA:  Medial knee pain and intermittent swelling. No report of injury. EXAM: RIGHT KNEE - COMPLETE 4+ VIEW COMPARISON:  None in PACs FINDINGS: The bones are subjectively adequately mineralized. There is moderate narrowing of the medial joint compartment. The lateral and patellofemoral joint spaces are well maintained. There is a small suprapatellar effusion. There is beaking of the tibial spines. Spurs arise from the superior and inferior articular margins of the patella. There is no acute fracture or dislocation. IMPRESSION: There is osteoarthritic change centered on the medial and patellofemoral compartments. There is a small suprapatellar effusion. There is no acute bony abnormality. Electronically Signed   By: David  Martinique M.D.   On: 08/02/2016 13:08       Assessment & Plan:   MERRITT MCCRAVY is a 76 y.o. male Right knee pain, unspecified chronicity - Plan: diclofenac sodium (VOLTAREN) 1 % GEL, DG Knee Complete 4 Views Right, Apply knee sleeve  -Medial and patellofemoral osteoarthritis seen on x-ray with small effusion. Possible degenerative meniscal disease as well.  -Placed in an off loader brace, trial of voltaren gel, then consider injection or advanced imaging if persistent pain or flair.   Pure hypercholesterolemia  - chose to try diet and  herbal approach as prior intolerant to statins. Recheck levels in net 6 months.   Meds ordered this encounter  Medications  . diclofenac sodium (VOLTAREN) 1 % GEL    Sig: Apply 4 g topically 4 (four) times daily. to R knee.    Dispense:  100 g    Refill:  2   Patient Instructions    Keep up the good work with diet/activity, red yeast rice to see if that helps cholesterol. Would recommend rechecking those numbers in next 3-6 months.      IF you received an x-ray today, you will receive an invoice from [facility]. Please contact [facility] at [phone] with questions or concerns regarding your invoice.   IF you received labwork today, you will receive an invoice from [place]. Please contact [facility] at [phone] with questions or concerns regarding your invoice.   Our billing staff will not be able to assist you with questions regarding bills from these companies.  You will be contacted with the lab results as soon as they are available. The fastest way to get your results is to activate your My Chart account. Instructions are located on the last page of this paperwork. If you have not heard from [place] regarding the results in 2 weeks, please contact this office.       I personally performed the services described in this documentation, which was scribed in my presence. The recorded information has been reviewed and considered for accuracy and completeness, addended by me as needed, and agree with information above.  Signed,   Merri Ray, MD Primary [facility] at [place].  08/03/16 1:43 PM

## 2016-08-02 NOTE — Patient Instructions (Addendum)
  Keep up the good work with diet/activity, red yeast rice to see if that helps cholesterol. Would recommend rechecking those numbers in next 3-6 months.      IF you received an x-ray today, you will receive an invoice from [facility]. Please contact [facility] at [phone] with questions or concerns regarding your invoice.   IF you received labwork today, you will receive an invoice from [place]. Please contact [facility] at [phone] with questions or concerns regarding your invoice.   Our billing staff will not be able to assist you with questions regarding bills from these companies.  You will be contacted with the lab results as soon as they are available. The fastest way to get your results is to activate your My Chart account. Instructions are located on the last page of this paperwork. If you have not heard from [place] regarding the results in 2 weeks, please contact this office.

## 2016-08-04 ENCOUNTER — Telehealth: Payer: Self-pay

## 2016-08-04 NOTE — Telephone Encounter (Signed)
PA Rejected  Voltaren 1% Gel Preferred Alternatives: Celecoxib, diflunisal, etodolac, etodolac extended-release (ER), flurbiprofen, ibuprofen suspension, ibuprofen tablet, ketoprofen, meloxicam, nabumetone, naproxen, naproxen delayed-release (DR), piroxicam, and sulindac.   Resubmitted PA AWAITING RESPONSE.

## 2016-08-07 NOTE — Telephone Encounter (Signed)
Voltaren gel DENIED, not a covered medication. See previous comment for alternatives.  Appeal # [phone]  Appeal fax [phone]) [phone]

## 2016-08-09 ENCOUNTER — Telehealth: Payer: Self-pay

## 2016-08-10 NOTE — Telephone Encounter (Signed)
Call patient. Voltaren gel was not covered by insurance. For now okay to take ibuprofen up to 600 mg on occasion only for flare in knee pain. The brace that was prescribed last visit should also lessen discomfort. If he does require ibuprofen frequently, would look at other options. Let me know if there are questions.

## 2016-08-13 ENCOUNTER — Encounter: Payer: Self-pay | Admitting: Internal Medicine

## 2016-08-13 ENCOUNTER — Ambulatory Visit (AMBULATORY_SURGERY_CENTER): Payer: Self-pay | Admitting: *Deleted

## 2016-08-13 VITALS — Ht 70.0 in | Wt 198.0 lb

## 2016-08-13 DIAGNOSIS — Z86010 Personal history of colonic polyps: Secondary | ICD-10-CM

## 2016-08-13 NOTE — Progress Notes (Signed)
Patient denies any allergies to eggs or soy. Patient denies any problems with anesthesia/sedation. Patient denies any oxygen use at home and does not take any diet/weight loss medications. EMMI education assisgned to patient on colonoscopy, this was explained and instructions given to patient. 

## 2016-08-14 NOTE — Telephone Encounter (Signed)
Called and spoke with pt. He says ibuprofen is fine bc his knee pain is sporadic. He states he did receive notice from his insurance of the denial for Voltaren.

## 2016-08-27 ENCOUNTER — Ambulatory Visit (AMBULATORY_SURGERY_CENTER): Payer: Medicare Other | Admitting: Internal Medicine

## 2016-08-27 ENCOUNTER — Encounter: Payer: Self-pay | Admitting: Internal Medicine

## 2016-08-27 VITALS — BP 132/88 | HR 57 | Temp 98.0°F | Resp 12 | Ht 70.0 in | Wt 198.0 lb

## 2016-08-27 DIAGNOSIS — D123 Benign neoplasm of transverse colon: Secondary | ICD-10-CM | POA: Diagnosis not present

## 2016-08-27 DIAGNOSIS — K635 Polyp of colon: Secondary | ICD-10-CM | POA: Diagnosis not present

## 2016-08-27 DIAGNOSIS — Z86010 Personal history of colonic polyps: Secondary | ICD-10-CM

## 2016-08-27 DIAGNOSIS — Z1211 Encounter for screening for malignant neoplasm of colon: Secondary | ICD-10-CM | POA: Diagnosis not present

## 2016-08-27 DIAGNOSIS — D124 Benign neoplasm of descending colon: Secondary | ICD-10-CM

## 2016-08-27 MED ORDER — SODIUM CHLORIDE 0.9 % IV SOLN: 500.0000 mL | INTRAVENOUS | Status: DC

## 2016-08-27 NOTE — Op Note (Signed)
[place] Patient Name: Corey Cervantes Procedure Date: 08/27/2016 10:44 AM MRN: [id number] Endoscopist: Gatha Mayer , MD Age: 76 Referring MD:  Date of Birth: [birth year] Gender: Male Account #: [id number] Procedure:                Colonoscopy Indications:              Surveillance: Personal history of adenomatous                            polyps on last colonoscopy > 5 years ago Medicines:                Propofol per Anesthesia, Monitored Anesthesia Care Procedure:                Pre-Anesthesia Assessment:                           - Prior to the procedure, a History and Physical                            was performed, and patient medications and                            allergies were reviewed. The patient's tolerance of                            previous anesthesia was also reviewed. The risks                            and benefits of the procedure and the sedation                            options and risks were discussed with the patient.                            All questions were answered, and informed consent                            was obtained. Prior Anticoagulants: The patient has                            taken no previous anticoagulant or antiplatelet                            agents. ASA Grade Assessment: II - A patient with                            mild systemic disease. After reviewing the risks                            and benefits, the patient was deemed in                            satisfactory condition to undergo the procedure.  After obtaining informed consent, the colonoscope                            was passed under direct vision. Throughout the                            procedure, the patient's blood pressure, pulse, and                            oxygen saturations were monitored continuously. The                            Colonoscope was introduced through the anus and   advanced to the the cecum, identified by                            appendiceal orifice and ileocecal valve. The                            colonoscopy was performed without difficulty. The                            patient tolerated the procedure well. The quality                            of the bowel preparation was good. The ileocecal                            valve, appendiceal orifice, and rectum were                            photographed. Scope In: 10:51:12 AM Scope Out: 53:66:44 AM Scope Withdrawal Time: 0 hours 21 minutes 52 seconds  Total Procedure Duration: 0 hours 26 minutes 59 seconds  Findings:                 The perianal examination was normal.                           The digital rectal exam findings include enlarged                            prostate. Pertinent negatives include no palpable                            rectal lesions.                           A 12 mm polyp was found in the transverse colon.                            The polyp was semi-pedunculated. The polyp was                            removed with a hot snare. Resection and retrieval  were complete. Verification of patient                            identification for the specimen was done. Estimated                            blood loss: none.                           Two sessile polyps were found in the descending                            colon and transverse colon. The polyps were small                            in size. These polyps were removed with a cold                            snare. Resection and retrieval were complete.                            Verification of patient identification for the                            specimen was done. Estimated blood loss was minimal.                           Three sessile polyps were found in the transverse                            colon. The polyps were diminutive in size. These                             polyps were removed with a cold biopsy forceps.                            Resection and retrieval were complete. Verification                            of patient identification for the specimen was                            done. Estimated blood loss was minimal.                           Multiple diverticula were found in the sigmoid                            colon.                           The exam was otherwise without abnormality on  direct and retroflexion views. Complications:            No immediate complications. Estimated Blood Loss:     Estimated blood loss was minimal. Impression:               - Enlarged prostate found on digital rectal exam.                           - One 12 mm polyp in the transverse colon, removed                            with a hot snare. Resected and retrieved.                           - Two small polyps in the descending colon and in                            the transverse colon, removed with a cold snare.                            Resected and retrieved.                           - Three diminutive polyps in the transverse colon,                            removed with a cold biopsy forceps. Resected and                            retrieved.                           - Diverticulosis in the sigmoid colon.                           - The examination was otherwise normal on direct                            and retroflexion views. Recommendation:           - Patient has a contact number available for                            emergencies. The signs and symptoms of potential                            delayed complications were discussed with the                            patient. Return to normal activities tomorrow.                            Written discharge instructions were provided to the                            patient.                           -  Continue present medications.                           -  No aspirin, ibuprofen, naproxen, or other                            non-steroidal anti-inflammatory drugs for 2 weeks                            after polyp removal.                           - Repeat colonoscopy is recommended. The                            colonoscopy date will be determined after pathology                            results from today's exam become available for                            review.                           - Resume previous diet. Gatha Mayer, MD 08/27/2016 11:34:38 AM This report has been signed electronically.

## 2016-08-27 NOTE — Progress Notes (Signed)
Called to room to assist during endoscopic procedure.  Patient ID and intended procedure confirmed with present staff. Received instructions for my participation in the procedure from the performing physician.  

## 2016-08-27 NOTE — Progress Notes (Signed)
A/ox3 pleased with MAC, report to Karen RN 

## 2016-08-27 NOTE — Progress Notes (Signed)
Pt's states no medical or surgical changes since previsit or office visit. 

## 2016-08-27 NOTE — Patient Instructions (Addendum)
I found and removed 6 polyps today. I will let you know pathology results and when to have another routine colonoscopy by mail and/or My Chart.  I appreciate the opportunity to care for you. Gatha Mayer, MD, FACG   NO ASPIRIN, ASPIRIN PRODUCTS OR NSAIDS (MOTRIN,ADVIL,IBUPROFEN,NAPROSYN,ALEVE, ANAPROX) [place], [id number].   HANDOUTS  GIVEN POLYPS AND DIVERTICULOSIS.  YOU HAD AN ENDOSCOPIC PROCEDURE TODAY AT [facility]:   Refer to the procedure report that was given to you for any specific questions about what was found during the examination.  If the procedure report does not answer your questions, please call your gastroenterologist to clarify.  If you requested that your care partner not be given the details of your procedure findings, then the procedure report has been included in a sealed envelope for you to review at your convenience later.  YOU SHOULD EXPECT: Some feelings of bloating in the abdomen. Passage of more gas than usual.  Walking can help get rid of the air that was put into your GI tract during the procedure and reduce the bloating. If you had a lower endoscopy (such as a colonoscopy or flexible sigmoidoscopy) you may notice spotting of blood in your stool or on the toilet paper. If you underwent a bowel prep for your procedure, you may not have a normal bowel movement for a few days.  Please Note:  You might notice some irritation and congestion in your nose or some drainage.  This is from the oxygen used during your procedure.  There is no need for concern and it should clear up in a day or so.  SYMPTOMS TO REPORT IMMEDIATELY:   Following lower endoscopy (colonoscopy or flexible sigmoidoscopy):  Excessive amounts of blood in the stool  Significant tenderness or worsening of abdominal pains  Swelling of the abdomen that is new, acute  Fever of 100F or higher   For urgent or emergent issues, a gastroenterologist can be reached at any  hour by calling [phone].   DIET:  We do recommend a small meal at first, but then you may proceed to your regular diet.  Drink plenty of fluids but you should avoid alcoholic beverages for 24 hours.  ACTIVITY:  You should plan to take it easy for the rest of today and you should NOT DRIVE or use heavy machinery until tomorrow (because of the sedation medicines used during the test).    FOLLOW UP: Our staff will call the number listed on your records the next business day following your procedure to check on you and address any questions or concerns that you may have regarding the information given to you following your procedure. If we do not reach you, we will leave a message.  However, if you are feeling well and you are not experiencing any problems, there is no need to return our call.  We will assume that you have returned to your regular daily activities without incident.  If any biopsies were taken you will be contacted by phone or by letter within the next 1-3 weeks.  Please call us at [phone] if you have not heard about the biopsies in 3 weeks.    SIGNATURES/CONFIDENTIALITY: You and/or your care partner have signed paperwork which will be entered into your electronic medical record.  These signatures attest to the fact that that the information above on your After Visit Summary has been reviewed and is understood.  Full responsibility of the confidentiality of this  discharge information lies with you and/or your care-partner. 

## 2016-08-28 ENCOUNTER — Telehealth: Payer: Self-pay | Admitting: *Deleted

## 2016-08-28 NOTE — Telephone Encounter (Signed)
  Follow up Call-  Call back number 08/27/2016  Post procedure Call Back phone  # [phone]  Permission to leave phone message Yes  Some recent data might be hidden     Patient questions:  Do you have a fever, pain , or abdominal swelling? No. Pain Score  0 *  Have you tolerated food without any problems? Yes.    Have you been able to return to your normal activities? Yes.    Do you have any questions about your discharge instructions: Diet   No. Medications  No. Follow up visit  No.  Do you have questions or concerns about your Care? No.  Actions: * If pain score is 4 or above: No action needed, pain <4.

## 2016-08-30 ENCOUNTER — Encounter: Payer: Self-pay | Admitting: Internal Medicine

## 2016-08-30 DIAGNOSIS — Z86010 Personal history of colonic polyps: Secondary | ICD-10-CM

## 2016-08-30 DIAGNOSIS — Z860101 Personal history of adenomatous and serrated colon polyps: Secondary | ICD-10-CM

## 2016-08-30 HISTORY — DX: Personal history of colonic polyps: Z86.010

## 2016-08-30 HISTORY — DX: Personal history of adenomatous and serrated colon polyps: Z86.0101

## 2016-08-30 NOTE — Progress Notes (Signed)
2 adenomas max 12 mm recall 2021 My Chart letter

## 2016-10-03 ENCOUNTER — Ambulatory Visit (INDEPENDENT_AMBULATORY_CARE_PROVIDER_SITE_OTHER): Payer: Medicare Other | Admitting: Cardiovascular Disease

## 2016-10-03 VITALS — BP 144/88 | HR 76 | Ht 71.0 in | Wt 195.1 lb

## 2016-10-03 DIAGNOSIS — R079 Chest pain, unspecified: Secondary | ICD-10-CM | POA: Diagnosis not present

## 2016-10-03 DIAGNOSIS — I10 Essential (primary) hypertension: Secondary | ICD-10-CM

## 2016-10-04 ENCOUNTER — Encounter: Payer: Self-pay | Admitting: Cardiovascular Disease

## 2016-10-04 NOTE — Patient Instructions (Signed)
Medication Instructions:  Your physician recommends that you continue on your current medications as directed. Please refer to the Current Medication list given to you today.   Labwork: None Ordered   Testing/Procedures: None Ordered   Follow-Up: Your physician wants you to follow-up in: 2 years with Dr. Burt Knack.  You will receive a reminder letter in the mail two months in advance. If you don't receive a letter, please call our office to schedule the follow-up appointment.   If you need a refill on your cardiac medications before your next appointment, please call your pharmacy.   Thank you for choosing [facility]! Christen Bame, RN [phone]

## 2016-10-04 NOTE — Progress Notes (Signed)
Cardiology Office Note Date:  10/04/2016   ID:  Corey Cervantes, Corey Cervantes 08-Mar-1940, MRN [id number]  PCP:  Wendie Agreste, MD  Cardiologist:  Sherren Mocha, MD    Chief Complaint  Patient presents with  . Chest Pain     History of Present Illness: Corey Cervantes is a 76 y.o. male who presents for evaluation of chest pain.  He was last seen in March 2015 for cardiac evaluation when he underwent an exercise Myoview stress perfusion scan demonstrating normal LV function and no ischemia.  The patient has background cardiovascular risk factors of HTN and hyperlipidemia. He has had problems with statin drugs. He has been treated with atorvastatin and pravastatin, but could not tolerate either drug. He describes a 'pulling' chest pain when he was on the statin drug. The pain did not radiate and was throughout the chest. There was no exertional component and his pain has completely resolved since he has discontinued pravastatin. He denies shortness of breath, edema, orthopnea, palpitations, or PND. He has no other complaints today. His activity level is moderate and he plans to increase his exercise program. This morning he walked 1.5 miles and had no symptoms with that level of activity.    Past Medical History:  Diagnosis Date  . Allergy   . Arthritis   . Cancer [facility]) many years ago    melanoma  . Hx of adenomatous colonic polyps 08/30/2016  . Hyperlipidemia     Past Surgical History:  Procedure Laterality Date  . APPENDECTOMY    . COLON SURGERY    . HERNIA REPAIR    . MELANOMA EXCISION  1984    Current Outpatient Prescriptions  Medication Sig Dispense Refill  . CHONDROITIN SULFATE PO Take 1 Dose by mouth daily.    Marland Kitchen aspirin EC 81 MG tablet Take 1 tablet (81 mg total) by mouth daily.    Marland Kitchen b complex vitamins tablet Take 1 tablet by mouth daily.    Marland Kitchen CALCIUM-MAGNESUIUM-ZINC 333-133-8.3 MG TABS Take 1 tablet by mouth daily.     . Cholecalciferol (VITAMIN D-3 PO) Take 1,000 mg by  mouth daily.    Marland Kitchen co-enzyme Q-10 30 MG capsule Take 30 mg by mouth daily.    . Ginkgo Biloba Extract 60 MG CAPS Take 1 capsule by mouth daily.     . hydrochlorothiazide (MICROZIDE) 12.5 MG capsule Take 1 capsule (12.5 mg total) by mouth daily. 90 capsule 1  . Kava, Piper methysticum, (KAVA KAVA PO) Take 1 Dose by mouth daily. Reported on 04/27/2015    . Licorice, Glycyrrhiza glabra, (LICORICE PO) Take 1 Dose by mouth daily.     . Red Yeast Rice Extract (RED YEAST RICE PO) Take 1 Dose by mouth daily.     . Saw Palmetto, Serenoa repens, (SAW PALMETTO PO) Take 1 Dose by mouth daily.      Current Facility-Administered Medications  Medication Dose Route Frequency Provider Last Rate Last Dose  . 0.9 %  sodium chloride infusion  500 mL Intravenous Continuous Gatha Mayer, MD        Allergies:   Codeine; Lipitor [atorvastatin]; and Pravastatin   Social History:  The patient  reports that he quit smoking about 35 years ago. His smoking use included Cigarettes, Pipe, and Cigars. He quit after 15.00 years of use. He has never used smokeless tobacco. He reports that he does not drink alcohol or use drugs.   Family History:  The patient's family history includes Dementia in  his mother; Heart disease in his father; Multiple sclerosis in his sister.    ROS:  Please see the history of present illness.  Otherwise, review of systems is positive for muscle pain.  All other systems are reviewed and negative.    PHYSICAL EXAM: VS:  BP (!) 144/88 (BP Location: Right Arm, Patient Position: Sitting, Cuff Size: Normal)   Pulse 76   Ht 5\' 11"  (1.803 m)   Wt 195 lb 1.6 oz (88.5 kg)   BMI 27.21 kg/m  , BMI Body mass index is 27.21 kg/m. GEN: Well nourished, well developed, in no acute distress  HEENT: normal  Neck: no JVD, no masses. No carotid bruits Cardiac: RRR without murmur or gallop                Respiratory:  clear to auscultation bilaterally, normal work of breathing GI: soft, nontender,  nondistended, + BS MS: no deformity or atrophy  Ext: no pretibial edema, pedal pulses 2+= bilaterally Skin: warm and dry, no rash Neuro:  Strength and sensation are intact Psych: euthymic mood, full affect  EKG:  EKG is ordered today. The ekg ordered today shows NSR 76 bpm, within normal limits  Recent Labs: 07/02/2016: ALT 13; BUN 15; Creatinine, Ser 1.08; Potassium 4.4; Sodium 142; TSH 3.630   Lipid Panel     Component Value Date/Time   CHOL 247 (H) 07/02/2016 1442   TRIG 98 07/02/2016 1442   HDL 52 07/02/2016 1442   CHOLHDL 4.8 07/02/2016 1442   CHOLHDL 4.7 11/03/2015 1005   VLDL 14 11/03/2015 1005   LDLCALC 175 (H) 07/02/2016 1442      Wt Readings from Last 3 Encounters:  10/04/16 195 lb 1.6 oz (88.5 kg)  08/27/16 198 lb (89.8 kg)  08/13/16 198 lb (89.8 kg)     Cardiac Studies Reviewed: Myoview stress test 05-21-2013: Stress Procedure:  The patient exercised on the treadmill utilizing the Bruce Protocol for 8:33 minutes, RPE=15. The patient stopped due to DOE and denied any chest pain. There was a mild hypertensive response to exercise. Technetium 4m Sestamibi was injected at peak exercise and myocardial perfusion imaging was performed after a brief delay. Stress ECG: No significant change from baseline ECG  QPS Raw Data Images:  Normal; no motion artifact; normal heart/lung ratio. Stress Images:  Normal homogeneous uptake in all areas of the myocardium. Rest Images:  Normal homogeneous uptake in all areas of the myocardium. Subtraction (SDS):  No evidence of ischemia. Transient Ischemic Dilatation (Normal <1.22):  0.88 Lung/Heart Ratio (Normal <0.45):  0.26  Quantitative Gated Spect Images QGS EDV:  92 ml QGS ESV:  36 ml  Impression Exercise Capacity:  Good exercise capacity. BP Response:  Normal blood pressure response. Clinical Symptoms:  There is dyspnea. ECG Impression:  No significant ST segment change suggestive of ischemia. Comparison with Prior  Nuclear Study: No images to compare  Overall Impression:  Normal stress nuclear study.  LV Ejection Fraction: 61%.  LV Wall Motion:  NL LV Function; NL Wall Motion  ASSESSMENT AND PLAN: 1.  Chest pain: by history this was likely muscular pain related to the statin drugs he has been taking. His symptoms have completely resolved since he has discontinued pravastatin and he currently has no cardiopulmonary symptoms. Would continue with risk-factor modification.  2. Hyperlipidemia: unable to tolerate statins. Most recent lipids reviewed with a cholesterol of 247, LDL 175, HDL 52, Trig 98. We discussed lifestyle modification at length today and he understands that diet and  exercise are critical components of lipid lowering since he cannot tolerate statins. I am not convinced by any data supporting benefit of non-statin agents in primary prevention and he will not qualify for PCSK9 inhibitor Rx without CAD, therefore would favor lifestyle modification.  I will see the patient back in 2 years for follow-up but would be happy to see him sooner if problems arise.   Current medicines are reviewed with the patient today.  The patient does not have concerns regarding medicines.  Labs/ tests ordered today include:  No orders of the defined types were placed in this encounter.  Disposition:   FU 2 years  Signed, Sherren Mocha, MD  10/04/2016 9:17 AM    [address], [place], [place]  [address] Phone: [phone]; Fax: [phone]

## 2016-11-03 ENCOUNTER — Other Ambulatory Visit: Payer: Self-pay | Admitting: Family Medicine

## 2016-11-03 DIAGNOSIS — I10 Essential (primary) hypertension: Secondary | ICD-10-CM

## 2016-12-05 ENCOUNTER — Telehealth: Payer: Self-pay | Admitting: Family Medicine

## 2016-12-05 NOTE — Telephone Encounter (Signed)
PATIENT WOULD LIKE DR. GREENE TO KNOW THAT HE NEEDS A REFILL ON HIS HYDROCHLOROTHIAZIDE 12.5 MG. HE WOULD LIKE A 90 DAY SUPPLY. BEST PHONE [phone] (CELL) PHARMACY CHOICE IS [facility] ON [address]. [place]

## 2016-12-11 NOTE — Telephone Encounter (Signed)
Left detailed message for pt advising that he should have refills at his pharmacy. Also let pt know that he will need an OV any further refills.

## 2017-01-23 DIAGNOSIS — R350 Frequency of micturition: Secondary | ICD-10-CM | POA: Diagnosis not present

## 2017-01-23 DIAGNOSIS — N401 Enlarged prostate with lower urinary tract symptoms: Secondary | ICD-10-CM | POA: Diagnosis not present

## 2017-03-07 ENCOUNTER — Other Ambulatory Visit: Payer: Self-pay | Admitting: Family Medicine

## 2017-03-07 DIAGNOSIS — I10 Essential (primary) hypertension: Secondary | ICD-10-CM

## 2017-03-07 NOTE — Telephone Encounter (Signed)
Spoke with pt.  Scheduled appt for 03/11/2017 @2 :40pm. Pt states he has HCTZ remaining and will get #90 refill when he sees Dr. Carlota Raspberry on Monday.  Denied request.

## 2017-03-07 NOTE — Telephone Encounter (Signed)
Pt needs office visit before refilling. LOV was 10/03/16

## 2017-03-11 ENCOUNTER — Ambulatory Visit (INDEPENDENT_AMBULATORY_CARE_PROVIDER_SITE_OTHER): Payer: Medicare Other | Admitting: Family Medicine

## 2017-03-11 ENCOUNTER — Encounter: Payer: Self-pay | Admitting: Family Medicine

## 2017-03-11 ENCOUNTER — Other Ambulatory Visit: Payer: Self-pay

## 2017-03-11 VITALS — BP 122/70 | HR 67 | Temp 97.8°F | Ht 69.69 in | Wt 200.0 lb

## 2017-03-11 DIAGNOSIS — Z23 Encounter for immunization: Secondary | ICD-10-CM | POA: Diagnosis not present

## 2017-03-11 DIAGNOSIS — E063 Autoimmune thyroiditis: Secondary | ICD-10-CM

## 2017-03-11 DIAGNOSIS — R7303 Prediabetes: Secondary | ICD-10-CM

## 2017-03-11 DIAGNOSIS — E785 Hyperlipidemia, unspecified: Secondary | ICD-10-CM

## 2017-03-11 DIAGNOSIS — I10 Essential (primary) hypertension: Secondary | ICD-10-CM

## 2017-03-11 NOTE — Patient Instructions (Addendum)
As discussed at cardiology, diet and exercise may best approach for cholesterol treatment at this time.  Increase exercise and diet adherence should help numbers over the next 6 months.   I will check labs including A1c and thyroid test.   Follow up in 6 months.   Thanks for coming in today.   Prediabetes Eating Plan Prediabetes-also called impaired glucose tolerance or impaired fasting glucose-is a condition that causes blood sugar (blood glucose) levels to be higher than normal. Following a healthy diet can help to keep prediabetes under control. It can also help to lower the risk of type 2 diabetes and heart disease, which are increased in people who have prediabetes. Along with regular exercise, a healthy diet:  Promotes weight loss.  Helps to control blood sugar levels.  Helps to improve the way that the body uses insulin.  What do I need to know about this eating plan?  Use the glycemic index (GI) to plan your meals. The index tells you how quickly a food will raise your blood sugar. Choose low-GI foods. These foods take a longer time to raise blood sugar.  Pay close attention to the amount of carbohydrates in the food that you eat. Carbohydrates increase blood sugar levels.  Keep track of how many calories you take in. Eating the right amount of calories will help you to achieve a healthy weight. Losing about 7 percent of your starting weight can help to prevent type 2 diabetes.  You may want to follow a Mediterranean diet. This diet includes a lot of vegetables, lean meats or fish, whole grains, fruits, and healthy oils and fats. What foods can I eat? Grains Whole grains, such as whole-wheat or whole-grain breads, crackers, cereals, and pasta. Unsweetened oatmeal. Bulgur. Barley. Quinoa. Brown rice. Corn or whole-wheat flour tortillas or taco shells. Vegetables Lettuce. Spinach. Peas. Beets. Cauliflower. Cabbage. Broccoli. Carrots. Tomatoes. Squash. Eggplant. Herbs. Peppers.  Onions. Cucumbers. Brussels sprouts. Fruits Berries. Bananas. Apples. Oranges. Grapes. Papaya. Mango. Pomegranate. Kiwi. Grapefruit. Cherries. Meats and Other Protein Sources Seafood. Lean meats, such as chicken and [place] or lean cuts of pork and beef. Tofu. Eggs. Nuts. Beans. Dairy Low-fat or fat-free dairy products, such as yogurt, cottage cheese, and cheese. Beverages Water. Tea. Coffee. Sugar-free or diet soda. Seltzer water. Milk. Milk alternatives, such as soy or almond milk. Condiments Mustard. Relish. Low-fat, low-sugar ketchup. Low-fat, low-sugar barbecue sauce. Low-fat or fat-free mayonnaise. Sweets and Desserts Sugar-free or low-fat pudding. Sugar-free or low-fat ice cream and other frozen treats. Fats and Oils Avocado. Walnuts. Olive oil. The items listed above may not be a complete list of recommended foods or beverages. Contact your dietitian for more options. What foods are not recommended? Grains Refined white flour and flour products, such as bread, pasta, snack foods, and cereals. Beverages Sweetened drinks, such as sweet iced tea and soda. Sweets and Desserts Baked goods, such as cake, cupcakes, pastries, cookies, and cheesecake. The items listed above may not be a complete list of foods and beverages to avoid. Contact your dietitian for more information. This information is not intended to replace advice given to you by your health care provider. Make sure you discuss any questions you have with your health care provider. Document Released: 06/29/2014 Document Revised: 07/21/2015 Document Reviewed: 03/10/2014 Elsevier Interactive Patient Education  2017 [employer].     IF you received an x-ray today, you will receive an invoice from [facility]. Please contact [facility] at [phone] with questions or concerns regarding your invoice.  IF you received labwork today, you will receive an invoice from [facility]. Please contact [facility] at  [phone] with questions or concerns regarding your invoice.   Our billing staff will not be able to assist you with questions regarding bills from these companies.  You will be contacted with the lab results as soon as they are available. The fastest way to get your results is to activate your My Chart account. Instructions are located on the last page of this paperwork. If you have not heard from [place] regarding the results in 2 weeks, please contact this office.

## 2017-03-11 NOTE — Progress Notes (Signed)
Subjective:  By signing my name below, I, Corey Cervantes, attest that this documentation has been prepared under the direction and in the presence of Wendie Agreste, MD Electronically Signed: [name], ED Scribe 03/11/2017 at 3:38 PM.   Patient ID: Corey Cervantes, male    DOB: Feb 24, 1941, 77 y.o.   MRN: [id number]  Chief Complaint  Patient presents with  . Hyperlipidemia    follow-up    HPI Corey Cervantes is a 77 y.o. male who presents to [facility] Care at [facility] for f/u.   Pre-DM Lab Results  Component Value Date   HGBA1C 6.1 (H) 07/02/2016   Wt Readings from Last 3 Encounters:  03/11/17 200 lb (90.7 kg)  10/04/16 195 lb 1.6 oz (88.5 kg)  08/27/16 198 lb (89.8 kg)  Metformin 500 mg bid. Held while he had contrast study in the hospital.   Hyperlipidemia Lab Results  Component Value Date   CHOL 247 (H) 07/02/2016   HDL 52 07/02/2016   LDLCALC 175 (H) 07/02/2016   TRIG 98 07/02/2016   CHOLHDL 4.8 07/02/2016   Lab Results  Component Value Date   ALT 13 07/02/2016   AST 20 07/02/2016   ALKPHOS 63 07/02/2016   BILITOT 0.6 07/02/2016  Seen 8/8 by cardiology. At that time for suspected muscular cp. No specific further testing reccommended. Normal nuclear stress test in 2015. Chest wall pain improved with discontinuing statin. Recommended risk factor modification and that diet and exercise were critical components of lowering since he could not tolerate. Didn't recommend any non-statin agents for primary prevention and not PCSK9 as not CAD. Recommended 2 yr f/u. --- Pt states that he did start a pre-DM diet once and loss ~20 lbs but his wife retired, started cooking more foods that he likes and he stopped the diet. He states that he has not been exercising much due to the weather. Also reports that he stops his exercise regimen when his knee pain flares but reports that knee brace has improved pain overall. He further reports that he has cheated on his diet recently during  the holidays and had a few glasses of wine.  HTN Lab Results  Component Value Date   CREATININE 1.08 07/02/2016  HCTZ 12.5 mg qd.  H/o Chronic Lymphocytic Thyroiditis  TSH has been monitored in the past, stable. Lab Results  Component Value Date   TSH 3.630 07/02/2016    Patient Active Problem List   Diagnosis Date Noted  . Hx of adenomatous colonic polyps 08/30/2016  . HTN (hypertension) 05/25/2013  . Other and unspecified hyperlipidemia 05/25/2013  . Hyperglycemia 05/25/2013   Past Medical History:  Diagnosis Date  . Allergy   . Arthritis   . Cancer [facility]) many years ago    melanoma  . Hx of adenomatous colonic polyps 08/30/2016  . Hyperlipidemia    Past Surgical History:  Procedure Laterality Date  . APPENDECTOMY    . COLON SURGERY    . HERNIA REPAIR    . MELANOMA EXCISION  1984   Allergies  Allergen Reactions  . Codeine Other (See Comments)    NIGHTMARES  . Lipitor [Atorvastatin] Other (See Comments)    Malaise, myalgia.   . Pravastatin Other (See Comments)    Myalgia, malaise.    Prior to Admission medications   Medication Sig Start Date End Date Taking? Authorizing Provider  aspirin EC 81 MG tablet Take 1 tablet (81 mg total) by mouth daily. 05/07/13  Yes Sherren Mocha, MD  b complex vitamins tablet Take 1 tablet by mouth daily.   Yes [provider]  CALCIUM-MAGNESUIUM-ZINC 333-133-8.3 MG TABS Take 1 tablet by mouth daily.    Yes [provider]  Cholecalciferol (VITAMIN D-3 PO) Take 1,000 mg by mouth daily.   Yes [provider]  CHONDROITIN SULFATE PO Take 1 Dose by mouth daily.   Yes [provider]  co-enzyme Q-10 30 MG capsule Take 30 mg by mouth daily.   Yes [provider]  Ginkgo Biloba Extract 60 MG CAPS Take 1 capsule by mouth daily.    Yes [provider]  hydrochlorothiazide (MICROZIDE) 12.5 MG capsule Take 1 capsule (12.5 mg total) by mouth daily. 07/02/16  Yes Wendie Agreste, MD  Kava,  Piper methysticum, (KAVA KAVA PO) Take 1 Dose by mouth daily. Reported on 04/27/2015   Yes [provider]  Licorice, Glycyrrhiza glabra, (LICORICE PO) Take 1 Dose by mouth daily.    Yes [provider]  Red Yeast Rice Extract (RED YEAST RICE PO) Take 1 Dose by mouth daily.    Yes [provider]  Saw Palmetto, Serenoa repens, (SAW PALMETTO PO) Take 1 Dose by mouth daily.    Yes [provider]   Social History   Socioeconomic History  . Marital status: Married    Spouse name: Not on file  . Number of children: Not on file  . Years of education: Not on file  . Highest education level: Not on file  Social Needs  . Financial resource strain: Not on file  . Food insecurity - worry: Not on file  . Food insecurity - inability: Not on file  . Transportation needs - medical: Not on file  . Transportation needs - non-medical: Not on file  Occupational History  . Occupation: Art gallery manager  Tobacco Use  . Smoking status: Former Smoker    Years: 15.00    Types: Cigarettes, Pipe, Cigars    Last attempt to quit: 02/26/1981    Years since quitting: 36.0  . Smokeless tobacco: Never Used  Substance and Sexual Activity  . Alcohol use: No    Comment: no use at this time per pt  . Drug use: No  . Sexual activity: Yes  Other Topics Concern  . Not on file  Social History Narrative  . Not on file   Review of Systems  Constitutional: Negative for fatigue and unexpected weight change.  Eyes: Negative for visual disturbance.  Respiratory: Negative for cough, chest tightness and shortness of breath.   Cardiovascular: Negative for chest pain, palpitations and leg swelling.  Gastrointestinal: Negative for abdominal pain and blood in stool.  Neurological: Negative for dizziness, light-headedness and headaches.      Objective:   Physical Exam  Constitutional: He is oriented to person, place, and time. He appears well-developed and well-nourished.  HENT:    Head: Normocephalic and atraumatic.  Eyes: EOM are normal. Pupils are equal, round, and reactive to light.  Neck: No JVD present. Carotid bruit is not present.  Cardiovascular: Normal rate, regular rhythm and normal heart sounds.  No murmur heard. Pulmonary/Chest: Effort normal and breath sounds normal. He has no rales.  Musculoskeletal: He exhibits no edema.  Neurological: He is alert and oriented to person, place, and time.  Skin: Skin is warm and dry.  Psychiatric: He has a normal mood and affect.  Vitals reviewed.  Vitals:   03/11/17 1502  BP: 122/70  Pulse: 67  Temp: 97.8 F (36.6 C)  TempSrc: Oral  SpO2: 98%  Weight: 200 lb (90.7 kg)  Height: 5' 9.69" (1.77 m)      Assessment & Plan:  Corey Cervantes is a 77 y.o. male Hyperlipidemia, unspecified hyperlipidemia type - Plan: Lipid panel, Comprehensive metabolic panel  - Repeat labs, but plan to hold on statin at this time has not tolerated previously and based on cardiology recommendations above. Will continue to stress importance of exercise and diet. May have slight increased readings at this time due to recent holidays and change in diet.  Lymphocytic thyroiditis - Plan: TSH  -Repeat TSH for ongoing monitoring. Asymptomatic  Prediabetes - Plan: Hemoglobin A1c  -Exercise, diet stress present. Check A1c  Essential hypertension  -Stable. Continue HCTZ same dose. CMP pending  Need for prophylactic vaccination and inoculation against influenza - Plan: Flu Vaccine QUAD 36+ mos IM   No orders of the defined types were placed in this encounter.  Patient Instructions   As discussed at cardiology, diet and exercise may best approach for cholesterol treatment at this time.  Increase exercise and diet adherence should help numbers over the next 6 months.   I will check labs including A1c and thyroid test.   Follow up in 6 months.   Thanks for coming in today.   Prediabetes Eating Plan Prediabetes-also called  impaired glucose tolerance or impaired fasting glucose-is a condition that causes blood sugar (blood glucose) levels to be higher than normal. Following a healthy diet can help to keep prediabetes under control. It can also help to lower the risk of type 2 diabetes and heart disease, which are increased in people who have prediabetes. Along with regular exercise, a healthy diet:  Promotes weight loss.  Helps to control blood sugar levels.  Helps to improve the way that the body uses insulin.  What do I need to know about this eating plan?  Use the glycemic index (GI) to plan your meals. The index tells you how quickly a food will raise your blood sugar. Choose low-GI foods. These foods take a longer time to raise blood sugar.  Pay close attention to the amount of carbohydrates in the food that you eat. Carbohydrates increase blood sugar levels.  Keep track of how many calories you take in. Eating the right amount of calories will help you to achieve a healthy weight. Losing about 7 percent of your starting weight can help to prevent type 2 diabetes.  You may want to follow a Mediterranean diet. This diet includes a lot of vegetables, lean meats or fish, whole grains, fruits, and healthy oils and fats. What foods can I eat? Grains Whole grains, such as whole-wheat or whole-grain breads, crackers, cereals, and pasta. Unsweetened oatmeal. Bulgur. Barley. Quinoa. Brown rice. Corn or whole-wheat flour tortillas or taco shells. Vegetables Lettuce. Spinach. Peas. Beets. Cauliflower. Cabbage. Broccoli. Carrots. Tomatoes. Squash. Eggplant. Herbs. Peppers. Onions. Cucumbers. Brussels sprouts. Fruits Berries. Bananas. Apples. Oranges. Grapes. Papaya. Mango. Pomegranate. Kiwi. Grapefruit. Cherries. Meats and Other Protein Sources Seafood. Lean meats, such as chicken and [place] or lean cuts of pork and beef. Tofu. Eggs. Nuts. Beans. Dairy Low-fat or fat-free dairy products, such as yogurt, cottage  cheese, and cheese. Beverages Water. Tea. Coffee. Sugar-free or diet soda. Seltzer water. Milk. Milk alternatives, such as soy or almond milk. Condiments Mustard. Relish. Low-fat, low-sugar ketchup. Low-fat, low-sugar barbecue sauce. Low-fat or fat-free mayonnaise. Sweets and Desserts Sugar-free or low-fat pudding. Sugar-free or low-fat ice cream and other frozen treats. Fats and Oils Avocado.  Walnuts. Olive oil. The items listed above may not be a complete list of recommended foods or beverages. Contact your dietitian for more options. What foods are not recommended? Grains Refined white flour and flour products, such as bread, pasta, snack foods, and cereals. Beverages Sweetened drinks, such as sweet iced tea and soda. Sweets and Desserts Baked goods, such as cake, cupcakes, pastries, cookies, and cheesecake. The items listed above may not be a complete list of foods and beverages to avoid. Contact your dietitian for more information. This information is not intended to replace advice given to you by your health care provider. Make sure you discuss any questions you have with your health care provider. Document Released: 06/29/2014 Document Revised: 07/21/2015 Document Reviewed: 03/10/2014 Elsevier Interactive Patient Education  2017 [employer].     IF you received an x-ray today, you will receive an invoice from [facility]. Please contact [facility] at [phone] with questions or concerns regarding your invoice.   IF you received labwork today, you will receive an invoice from [facility]. Please contact [facility] at [phone] with questions or concerns regarding your invoice.   Our billing staff will not be able to assist you with questions regarding bills from these companies.  You will be contacted with the lab results as soon as they are available. The fastest way to get your results is to activate your My Chart account. Instructions are located on the  last page of this paperwork. If you have not heard from [place] regarding the results in 2 weeks, please contact this office.       I personally performed the services described in this documentation, which was scribed in my presence. The recorded information has been reviewed and considered for accuracy and completeness, addended by me as needed, and agree with information above.  Signed,   Merri Ray, MD [facility] [place].  03/13/17 4:21 PM

## 2017-03-12 LAB — COMPREHENSIVE METABOLIC PANEL
ALT: 12 IU/L (ref 0–44)
AST: 19 IU/L (ref 0–40)
Albumin/Globulin Ratio: 1.6 (ref 1.2–2.2)
Albumin: 4.3 g/dL (ref 3.5–4.8)
Alkaline Phosphatase: 59 IU/L (ref 39–117)
BUN/Creatinine Ratio: 14 (ref 10–24)
BUN: 18 mg/dL (ref 8–27)
Bilirubin Total: 0.5 mg/dL (ref 0.0–1.2)
CALCIUM: 9.4 mg/dL (ref 8.6–10.2)
CO2: 24 mmol/L (ref 20–29)
CREATININE: 1.25 mg/dL (ref 0.76–1.27)
Chloride: 100 mmol/L (ref 96–106)
GFR, EST AFRICAN AMERICAN: 65 mL/min/{1.73_m2} (ref >59)
GFR, EST NON AFRICAN AMERICAN: 56 mL/min/{1.73_m2} — AB (ref >59)
GLOBULIN, TOTAL: 2.7 g/dL (ref 1.5–4.5)
Glucose: 121 mg/dL — ABNORMAL HIGH (ref 65–99)
Potassium: 4.1 mmol/L (ref 3.5–5.2)
SODIUM: 138 mmol/L (ref 134–144)
TOTAL PROTEIN: 7 g/dL (ref 6.0–8.5)

## 2017-03-12 LAB — LIPID PANEL
CHOLESTEROL TOTAL: 250 mg/dL — AB (ref 100–199)
Chol/HDL Ratio: 4.2 ratio (ref 0.0–5.0)
HDL: 59 mg/dL (ref >39)
LDL CALC: 178 mg/dL — AB (ref 0–99)
Triglycerides: 66 mg/dL (ref 0–149)
VLDL Cholesterol Cal: 13 mg/dL (ref 5–40)

## 2017-03-12 LAB — HEMOGLOBIN A1C
ESTIMATED AVERAGE GLUCOSE: 126 mg/dL
HEMOGLOBIN A1C: 6 % — AB (ref 4.8–5.6)

## 2017-03-12 LAB — TSH: TSH: 3.73 u[IU]/mL (ref 0.450–4.500)

## 2017-03-13 MED ORDER — HYDROCHLOROTHIAZIDE 12.5 MG PO CAPS: 12.5000 mg | ORAL_CAPSULE | Freq: Every day | ORAL | 1 refills | Status: DC

## 2017-09-21 ENCOUNTER — Other Ambulatory Visit: Payer: Self-pay | Admitting: Family Medicine

## 2017-09-21 DIAGNOSIS — I10 Essential (primary) hypertension: Secondary | ICD-10-CM

## 2018-02-12 ENCOUNTER — Ambulatory Visit (INDEPENDENT_AMBULATORY_CARE_PROVIDER_SITE_OTHER): Payer: Medicare Other | Admitting: Family Medicine

## 2018-02-12 DIAGNOSIS — Z23 Encounter for immunization: Secondary | ICD-10-CM

## 2018-03-07 ENCOUNTER — Ambulatory Visit (INDEPENDENT_AMBULATORY_CARE_PROVIDER_SITE_OTHER): Payer: Medicare Other | Admitting: Family Medicine

## 2018-03-07 ENCOUNTER — Encounter: Payer: Self-pay | Admitting: Family Medicine

## 2018-03-07 ENCOUNTER — Other Ambulatory Visit: Payer: Self-pay

## 2018-03-07 VITALS — BP 144/70 | HR 73 | Temp 97.8°F | Ht 71.0 in | Wt 206.8 lb

## 2018-03-07 DIAGNOSIS — R7303 Prediabetes: Secondary | ICD-10-CM | POA: Diagnosis not present

## 2018-03-07 DIAGNOSIS — R05 Cough: Secondary | ICD-10-CM

## 2018-03-07 DIAGNOSIS — H9193 Unspecified hearing loss, bilateral: Secondary | ICD-10-CM

## 2018-03-07 DIAGNOSIS — E063 Autoimmune thyroiditis: Secondary | ICD-10-CM | POA: Diagnosis not present

## 2018-03-07 DIAGNOSIS — Z0001 Encounter for general adult medical examination with abnormal findings: Secondary | ICD-10-CM

## 2018-03-07 DIAGNOSIS — I10 Essential (primary) hypertension: Secondary | ICD-10-CM

## 2018-03-07 DIAGNOSIS — E785 Hyperlipidemia, unspecified: Secondary | ICD-10-CM

## 2018-03-07 DIAGNOSIS — R059 Cough, unspecified: Secondary | ICD-10-CM

## 2018-03-07 DIAGNOSIS — Z0000 Encounter for general adult medical examination without abnormal findings: Secondary | ICD-10-CM

## 2018-03-07 MED ORDER — HYDROCHLOROTHIAZIDE 12.5 MG PO CAPS: ORAL_CAPSULE | ORAL | 2 refills | Status: DC

## 2018-03-07 NOTE — Patient Instructions (Addendum)
I will check some labs today.  Follow up with dentist when possible.   I recommend hearing test - can check to see if [facility] can do this or let me know if I can refer you.   Follow up with eye specialist as discussed.   See info on cough below. Try flonase nasal spray.  If that does help in the next few weeks, return for possible xray or other treatment plan. Return to the clinic or go to the nearest emergency room if any of your symptoms worsen or new symptoms occur.  Follow up with urologist as planned.   I will check diabetes test again today, but at prediabetes level last year.  Weight has increased some. Continue exercise - goal of 150 minutes per week minimum.   If any worsening of neck stiffness, return for recheck and possible med options.   If any changes in memory or continued focus issues, we can look at other testing or medications, but some of the focus may be due to stressors in past year. Let me know if that is not improving.   Thanks for coming in today.    Cough, Adult  Coughing is a reflex that clears your throat and your airways. Coughing helps to heal and protect your lungs. It is normal to cough occasionally, but a cough that happens with other symptoms or lasts a long time may be a sign of a condition that needs treatment. A cough may last only 2-3 weeks (acute), or it may last longer than 8 weeks (chronic). What are the causes? Coughing is commonly caused by:  Breathing in substances that irritate your lungs.  A viral or bacterial respiratory infection.  Allergies.  Asthma.  Postnasal drip.  Smoking.  Acid backing up from the stomach into the esophagus (gastroesophageal reflux).  Certain medicines.  Chronic lung problems, including COPD (or rarely, lung cancer).  Other medical conditions such as heart failure. Follow these instructions at home: Pay attention to any changes in your symptoms. Take these actions to help with your  discomfort:  Take medicines only as told by your health care provider. ? If you were prescribed an antibiotic medicine, take it as told by your health care provider. Do not stop taking the antibiotic even if you start to feel better. ? Talk with your health care provider before you take a cough suppressant medicine.  Drink enough fluid to keep your urine clear or pale yellow.  If the air is dry, use a cold steam vaporizer or humidifier in your bedroom or your home to help loosen secretions.  Avoid anything that causes you to cough at work or at home.  If your cough is worse at night, try sleeping in a semi-upright position.  Avoid cigarette smoke. If you smoke, quit smoking. If you need help quitting, ask your health care provider.  Avoid caffeine.  Avoid alcohol.  Rest as needed. Contact a health care provider if:  You have new symptoms.  You cough up pus.  Your cough does not get better after 2-3 weeks, or your cough gets worse.  You cannot control your cough with suppressant medicines and you are losing sleep.  You develop pain that is getting worse or pain that is not controlled with pain medicines.  You have a fever.  You have unexplained weight loss.  You have night sweats. Get help right away if:  You cough up blood.  You have difficulty breathing.  Your heartbeat is very fast.  This information is not intended to replace advice given to you by your health care provider. Make sure you discuss any questions you have with your health care provider. Document Released: 08/11/2010 Document Revised: 07/21/2015 Document Reviewed: 04/21/2014 Elsevier Interactive Patient Education  2019 [employer] 65 Years and Older, Male Preventive care refers to lifestyle choices and visits with your health care provider that can promote health and wellness. What does preventive care include?   A yearly physical exam. This is also called an annual well  check.  Dental exams once or twice a year.  Routine eye exams. Ask your health care provider how often you should have your eyes checked.  Personal lifestyle choices, including: ? Daily care of your teeth and gums. ? Regular physical activity. ? Eating a healthy diet. ? Avoiding tobacco and drug use. ? Limiting alcohol use. ? Practicing safe sex. ? Taking low doses of aspirin every day. ? Taking vitamin and mineral supplements as recommended by your health care provider. What happens during an annual well check? The services and screenings done by your health care provider during your annual well check will depend on your age, overall health, lifestyle risk factors, and family history of disease. Counseling Your health care provider may ask you questions about your:  Alcohol use.  Tobacco use.  Drug use.  Emotional well-being.  Home and relationship well-being.  Sexual activity.  Eating habits.  History of falls.  Memory and ability to understand (cognition).  Work and work Statistician. Screening You may have the following tests or measurements:  Height, weight, and BMI.  Blood pressure.  Lipid and cholesterol levels. These may be checked every 5 years, or more frequently if you are over 52 years old.  Skin check.  Lung cancer screening. You may have this screening every year starting at age 2 if you have a 30-pack-year history of smoking and currently smoke or have quit within the past 15 years.  Colorectal cancer screening. All adults should have this screening starting at age 41 and continuing until age 97. You will have tests every 1-10 years, depending on your results and the type of screening test. People at increased risk should start screening at an earlier age. Screening tests may include: ? Guaiac-based fecal occult blood testing. ? Fecal immunochemical test (FIT). ? Stool DNA test. ? Virtual colonoscopy. ? Sigmoidoscopy. During this test, a  flexible tube with a tiny camera (sigmoidoscope) is used to examine your rectum and lower colon. The sigmoidoscope is inserted through your anus into your rectum and lower colon. ? Colonoscopy. During this test, a long, thin, flexible tube with a tiny camera (colonoscope) is used to examine your entire colon and rectum.  Prostate cancer screening. Recommendations will vary depending on your family history and other risks.  Hepatitis C blood test.  Hepatitis B blood test.  Sexually transmitted disease (STD) testing.  Diabetes screening. This is done by checking your blood sugar (glucose) after you have not eaten for a while (fasting). You may have this done every 1-3 years.  Abdominal aortic aneurysm (AAA) screening. You may need this if you are a current or former smoker.  Osteoporosis. You may be screened starting at age 63 if you are at high risk. Talk with your health care provider about your test results, treatment options, and if necessary, the need for more tests. Vaccines Your health care provider may recommend certain vaccines, such as:  Influenza vaccine. This is recommended every  year.  Tetanus, diphtheria, and acellular pertussis (Tdap, Td) vaccine. You may need a Td booster every 10 years.  Varicella vaccine. You may need this if you have not been vaccinated.  Zoster vaccine. You may need this after age 34.  Measles, mumps, and rubella (MMR) vaccine. You may need at least one dose of MMR if you were born in 1957 or later. You may also need a second dose.  Pneumococcal 13-valent conjugate (PCV13) vaccine. One dose is recommended after age 38.  Pneumococcal polysaccharide (PPSV23) vaccine. One dose is recommended after age 37.  Meningococcal vaccine. You may need this if you have certain conditions.  Hepatitis A vaccine. You may need this if you have certain conditions or if you travel or work in places where you may be exposed to hepatitis A.  Hepatitis B vaccine. You  may need this if you have certain conditions or if you travel or work in places where you may be exposed to hepatitis B.  Haemophilus influenzae type b (Hib) vaccine. You may need this if you have certain risk factors. Talk to your health care provider about which screenings and vaccines you need and how often you need them. This information is not intended to replace advice given to you by your health care provider. Make sure you discuss any questions you have with your health care provider. Document Released: 03/11/2015 Document Revised: 04/04/2017 Document Reviewed: 12/14/2014 Elsevier Interactive Patient Education  [employer].  If you have lab work done today you will be contacted with your lab results within the next 2 weeks.  If you have not heard from [place] then please contact us. The fastest way to get your results is to register for My Chart.   IF you received an x-ray today, you will receive an invoice from [facility]. Please contact [facility] at [phone] with questions or concerns regarding your invoice.   IF you received labwork today, you will receive an invoice from [facility]. Please contact [facility] at [phone] with questions or concerns regarding your invoice.   Our billing staff will not be able to assist you with questions regarding bills from these companies.  You will be contacted with the lab results as soon as they are available. The fastest way to get your results is to activate your My Chart account. Instructions are located on the last page of this paperwork. If you have not heard from [place] regarding the results in 2 weeks, please contact this office.

## 2018-03-07 NOTE — Progress Notes (Signed)
By signing my name below, I, .Temidayo Atanda-Ogunleye, attest that this documentation has been prepared under the direction and in the presence of Wendie Agreste, MD. Electronically Signed: Stephania Fragmin, Scribe 03/07/2018 at 3:28 PM. Subjective:    Patient ID: Corey Cervantes, male    DOB: 21-Jan-1941, 78 y.o.   MRN: [id number] Chief Complaint  Patient presents with  . Annual Exam    CPE   HPI Corey Cervantes is a 78 y.o. male who presents to Primary Care at [facility] for an annual physical. I last saw him in Jan 2019.  Prediabeties Lab Results  Component Value Date   HGBA1C 6.0 (H) 03/11/2017   Wt Readings from Last 3 Encounters:  03/07/18 206 lb 12.8 oz (93.8 kg)  03/11/17 200 lb (90.7 kg)  10/04/16 195 lb 1.6 oz (88.5 kg)   A1C was stable from 6.1 in 2018.   History of arthritis and hyperlipidemia Lab Results  Component Value Date   CHOL 250 (H) 03/11/2017   HDL 59 03/11/2017   LDLCALC 178 (H) 03/11/2017   TRIG 66 03/11/2017   CHOLHDL 4.2 03/11/2017   Lab Results  Component Value Date   ALT 12 03/11/2017   AST 19 03/11/2017   ALKPHOS 59 03/11/2017   BILITOT 0.5 03/11/2017   Decreased exercise last year as well as more food at home He is no longer taking red yeast rice. Currently taking [id number] and Garlic.  The 10-year ASCVD risk score Mikey Bussing DC Brooke Bonito., et al., 2013) is: 36.2%   Values used to calculate the score:     Age: 38 years     Sex: Male     Is Non-Hispanic African American: No     Diabetic: No     Tobacco smoker: No     Systolic Blood Pressure: 659 mmHg     Is BP treated: Yes     HDL Cholesterol: 59 mg/dL     Total Cholesterol: 250 mg/dL He did not tolerate statin due to chest wall pain in the past.   Hypertension BP Readings from Last 3 Encounters:  03/07/18 (!) 144/70  03/11/17 122/70  10/04/16 (!) 144/88   Lab Results  Component Value Date   CREATININE 1.25 03/11/2017   He is on HCTZ 12.5 mg QD.    Chronic Lymphocytic  Thyroiditis TSH monotoring in the past, stable. Lab Results  Component Value Date   TSH 3.730 03/11/2017   Cancer Screening Colonoscopy 08/27/16. Planned a 3 year follow up  Prostate Cancer Screening Prior elevated PSA, suspected BPH. Followed by Urology. Reports experiencing urinary frequency. He hasn't seen his urologist since 08/2016.  Immunizations Immunization History  Administered Date(s) Administered  . Influenza Split 12/24/2011  . Influenza, High Dose Seasonal PF 02/12/2018  . Influenza,inj,Quad PF,6+ Mos 04/20/2013, 10/18/2014, 11/03/2015, 03/11/2017  . Pneumococcal Conjugate-13 08/17/2013  . Pneumococcal Polysaccharide-23 10/18/2014  . Zoster 09/27/2014   Fall Screening No falls in the last year.  Depression Screening Depression screen [facility] 2/9 03/07/2018 03/11/2017 08/02/2016 07/02/2016 11/03/2015  Decreased Interest 0 0 0 0 0  Down, Depressed, Hopeless 0 0 0 0 0  PHQ - 2 Score 0 0 0 0 0   Patient has experienced a lot of stress due to family and financial issues within the last year. He denies feeling down/depressed. He feels that his stress level is more managable at this time and he is overall hopeful.  Functional Status Survey Is the patient deaf or have difficulty hearing?: Yes Does the patient  have difficulty seeing, even when wearing glasses/contacts?: Yes Does the patient have difficulty concentrating, remembering, or making decisions?: Yes Does the patient have difficulty walking or climbing stairs?: No Does the patient have difficulty dressing or bathing?: No Does the patient have difficulty doing errands alone such as visiting a doctor's office or shopping?: No  Chronic background hearing issue. Patient is interested in pursuing  A hearing aid if needed down the line, but it is of no urgent concern to him at this time.  Patient wears glasses and is due for an eye exam (he plans to schedule one soon)   Patient reports he has always been distractible and  hyperactive. Occasionally, he feels it is exacerbated and his unable to focus on tasks he needs to get done. He reports his symptoms are more attention based.  Memory Screen 6CIT Screen 03/07/2018  What Year? 0 points  What month? 0 points  What time? 0 points  Count back from 20 0 points  Months in reverse 0 points  Repeat phrase 0 points  Total Score 0   Dental He isn't necessarily concerned about these issues at this time. Although, he is following up with his dentist in Feb.  Exersize Patient reports that he has been experiencing a bit of weight gain because his wife has been making really good comfort food. He occasionally exercises.  Advanced Directives He has a healthcare POA and living will.   Past Medical History:  Diagnosis Date  . Allergy   . Arthritis   . Cancer [facility]) many years ago    melanoma  . Hx of adenomatous colonic polyps 08/30/2016  . Hyperlipidemia    Past Surgical History:  Procedure Laterality Date  . APPENDECTOMY    . COLON SURGERY    . HERNIA REPAIR    . MELANOMA EXCISION  1984   Allergies  Allergen Reactions  . Codeine Other (See Comments)    NIGHTMARES  . Lipitor [Atorvastatin] Other (See Comments)    Malaise, myalgia.   . Pravastatin Other (See Comments)    Myalgia, malaise.    Prior to Admission medications   Medication Sig Start Date End Date Taking? Authorizing Provider  aspirin EC 81 MG tablet Take 1 tablet (81 mg total) by mouth daily. 05/07/13   Sherren Mocha, MD  b complex vitamins tablet Take 1 tablet by mouth daily.    [provider]  CALCIUM-MAGNESUIUM-ZINC 333-133-8.3 MG TABS Take 1 tablet by mouth daily.     [provider]  Cholecalciferol (VITAMIN D-3 PO) Take 1,000 mg by mouth daily.    [provider]  CHONDROITIN SULFATE PO Take 1 Dose by mouth daily.    [provider]  co-enzyme Q-10 30 MG capsule Take 30 mg by mouth daily.    [provider]  Ginkgo Biloba Extract 60 MG  CAPS Take 1 capsule by mouth daily.     [provider]  hydrochlorothiazide (MICROZIDE) 12.5 MG capsule TAKE 1 CAPSULE BY MOUTH EVERY DAY 09/23/17   Wendie Agreste, MD  Kava, Piper methysticum, (KAVA KAVA PO) Take 1 Dose by mouth daily. Reported on 04/27/2015    [provider]  Licorice, Glycyrrhiza glabra, (LICORICE PO) Take 1 Dose by mouth daily.     [provider]  Red Yeast Rice Extract (RED YEAST RICE PO) Take 1 Dose by mouth daily.     [provider]  Saw Palmetto, Serenoa repens, (SAW PALMETTO PO) Take 1 Dose by mouth daily.  [provider]   Social History   Socioeconomic History  . Marital status: Married    Spouse name: Not on file  . Number of children: Not on file  . Years of education: Not on file  . Highest education level: Not on file  Occupational History  . Occupation: Art gallery manager  Social Needs  . Financial resource strain: Not on file  . Food insecurity:    Worry: Not on file    Inability: Not on file  . Transportation needs:    Medical: Not on file    Non-medical: Not on file  Tobacco Use  . Smoking status: Former Smoker    Years: 15.00    Types: Cigarettes, Pipe, Cigars    Last attempt to quit: 02/26/1981    Years since quitting: 37.0  . Smokeless tobacco: Never Used  Substance and Sexual Activity  . Alcohol use: No    Comment: no use at this time per pt  . Drug use: No  . Sexual activity: Yes  Lifestyle  . Physical activity:    Days per week: Not on file    Minutes per session: Not on file  . Stress: Not on file  Relationships  . Social connections:    Talks on phone: Not on file    Gets together: Not on file    Attends religious service: Not on file    Active member of club or organization: Not on file    Attends meetings of clubs or organizations: Not on file    Relationship status: Not on file  . Intimate partner violence:    Fear of current or ex partner: Not on file    Emotionally  abused: Not on file    Physically abused: Not on file    Forced sexual activity: Not on file  Other Topics Concern  . Not on file  Social History Narrative  . Not on file    Review of Systems  Constitutional: Negative for fever and unexpected weight change.  HENT: Positive for dental problem (Not acute. Following up with dentist) and hearing loss (Chronic (onset 10 years ago), interested in hearing aid). Negative for nosebleeds, postnasal drip, rhinorrhea and sore throat.   Eyes: Positive for photophobia (light sensitive eyes, chronic, worsened recently).  Respiratory: Positive for cough (chronic (1 year ago); dry cough).   Genitourinary: Positive for frequency (prostate related). Negative for difficulty urinating.       Urinary symptoms stable  Musculoskeletal: Positive for neck stiffness (waxes and wanes; long standing).  Neurological: Negative for weakness.       Objective:   Physical Exam Vitals signs reviewed.  Constitutional:      General: He is not in acute distress.    Appearance: Normal appearance. He is well-developed.  HENT:     Head: Normocephalic and atraumatic.     Right Ear: External ear normal.     Left Ear: External ear normal.     Nose:     Comments: Minimal edema of the turbinates. No discharge Eyes:     Conjunctiva/sclera: Conjunctivae normal.     Pupils: Pupils are equal, round, and reactive to light.  Neck:     Musculoskeletal: Normal range of motion and neck supple.     Thyroid: No thyromegaly.  Cardiovascular:     Rate and Rhythm: Normal rate and regular rhythm.     Heart sounds: Normal heart sounds.  Pulmonary:     Effort: Pulmonary effort is normal. No respiratory distress.  Breath sounds: Normal breath sounds. No wheezing.  Abdominal:     General: There is no distension.     Palpations: Abdomen is soft.     Tenderness: There is no abdominal tenderness.  Musculoskeletal: Normal range of motion.        General: No tenderness.    Lymphadenopathy:     Cervical: No cervical adenopathy.  Skin:    General: Skin is warm and dry.  Neurological:     Mental Status: He is alert and oriented to person, place, and time.     Deep Tendon Reflexes: Reflexes are normal and symmetric.  Psychiatric:        Mood and Affect: Mood normal.        Behavior: Behavior normal.        Thought Content: Thought content normal.     Vitals:   03/07/18 1456  BP: (!) 144/70  Pulse: 73  Temp: 97.8 F (36.6 C)  TempSrc: Oral  Weight: 206 lb 12.8 oz (93.8 kg)  Height: _0  (1.803 m)   Results for orders placed or performed in visit on 03/11/17  Lipid panel  Result Value Ref Range   Cholesterol, Total 250 (H) 100 - 199 mg/dL   Triglycerides 66 0 - 149 mg/dL   HDL 59 >39 mg/dL   VLDL Cholesterol Cal 13 5 - 40 mg/dL   LDL Calculated 178 (H) 0 - 99 mg/dL   Chol/HDL Ratio 4.2 0.0 - 5.0 ratio  Comprehensive metabolic panel  Result Value Ref Range   Glucose 121 (H) 65 - 99 mg/dL   BUN 18 8 - 27 mg/dL   Creatinine, Ser 1.25 0.76 - 1.27 mg/dL   GFR calc non Af Amer 56 (L) >59 mL/min/1.73   GFR calc Af Amer 65 >59 mL/min/1.73   BUN/Creatinine Ratio 14 10 - 24   Sodium 138 134 - 144 mmol/L   Potassium 4.1 3.5 - 5.2 mmol/L   Chloride 100 96 - 106 mmol/L   CO2 24 20 - 29 mmol/L   Calcium 9.4 8.6 - 10.2 mg/dL   Total Protein 7.0 6.0 - 8.5 g/dL   Albumin 4.3 3.5 - 4.8 g/dL   Globulin, Total 2.7 1.5 - 4.5 g/dL   Albumin/Globulin Ratio 1.6 1.2 - 2.2   Bilirubin Total 0.5 0.0 - 1.2 mg/dL   Alkaline Phosphatase 59 39 - 117 IU/L   AST 19 0 - 40 IU/L   ALT 12 0 - 44 IU/L  TSH  Result Value Ref Range   TSH 3.730 0.450 - 4.500 uIU/mL  Hemoglobin A1c  Result Value Ref Range   Hgb A1c MFr Bld 6.0 (H) 4.8 - 5.6 %   Est. average glucose Bld gHb Est-mCnc 126 mg/dL       Assessment & Plan:    Corey Cervantes is a 78 y.o. male Medicare annual wellness visit, subsequent  - - anticipatory guidance as below in AVS, screening labs if  needed. Health maintenance items as above in HPI discussed/recommended as applicable.  - no concerning responses on depression, fall, or functional status screening. Any positive responses noted as above. Advanced directives discussed as in CHL.   - focus related to stressors in past year likely. Memory screen reassuring. rtc precautions if persistent or worsening.   Hyperlipidemia, unspecified hyperlipidemia type - Plan: Lipid panel, Comprehensive metabolic panel  - intolerant to statins. Work on diet, exercise.   Prediabetes - Plan: Hemoglobin A1c  - diet/exercise discussed as above.   Chronic  lymphocytic thyroiditis - Plan: TSH  -check TSH.   Decreased hearing of both ears  - recommended screening.   Cough  - possible allergic/PND. Trail of flonase, RTC precautions for recheck and imaging if persistent.   Essential hypertension - Plan: hydrochlorothiazide (MICROZIDE) 12.5 MG capsule  - borderline. No changes at this time.   Meds ordered this encounter  Medications  . hydrochlorothiazide (MICROZIDE) 12.5 MG capsule    Sig: TAKE 1 CAPSULE BY MOUTH EVERY DAY    Dispense:  90 capsule    Refill:  2   Patient Instructions    I will check some labs today.  Follow up with dentist when possible.   I recommend hearing test - can check to see if [facility] can do this or let me know if I can refer you.   Follow up with eye specialist as discussed.   See info on cough below. Try flonase nasal spray.  If that does help in the next few weeks, return for possible xray or other treatment plan. Return to the clinic or go to the nearest emergency room if any of your symptoms worsen or new symptoms occur.  Follow up with urologist as planned.   I will check diabetes test again today, but at prediabetes level last year.  Weight has increased some. Continue exercise - goal of 150 minutes per week minimum.   If any worsening of neck stiffness, return for recheck and possible med options.    If any changes in memory or continued focus issues, we can look at other testing or medications, but some of the focus may be due to stressors in past year. Let me know if that is not improving.   Thanks for coming in today.    Cough, Adult  Coughing is a reflex that clears your throat and your airways. Coughing helps to heal and protect your lungs. It is normal to cough occasionally, but a cough that happens with other symptoms or lasts a long time may be a sign of a condition that needs treatment. A cough may last only 2-3 weeks (acute), or it may last longer than 8 weeks (chronic). What are the causes? Coughing is commonly caused by:  Breathing in substances that irritate your lungs.  A viral or bacterial respiratory infection.  Allergies.  Asthma.  Postnasal drip.  Smoking.  Acid backing up from the stomach into the esophagus (gastroesophageal reflux).  Certain medicines.  Chronic lung problems, including COPD (or rarely, lung cancer).  Other medical conditions such as heart failure. Follow these instructions at home: Pay attention to any changes in your symptoms. Take these actions to help with your discomfort:  Take medicines only as told by your health care provider. ? If you were prescribed an antibiotic medicine, take it as told by your health care provider. Do not stop taking the antibiotic even if you start to feel better. ? Talk with your health care provider before you take a cough suppressant medicine.  Drink enough fluid to keep your urine clear or pale yellow.  If the air is dry, use a cold steam vaporizer or humidifier in your bedroom or your home to help loosen secretions.  Avoid anything that causes you to cough at work or at home.  If your cough is worse at night, try sleeping in a semi-upright position.  Avoid cigarette smoke. If you smoke, quit smoking. If you need help quitting, ask your health care provider.  Avoid caffeine.  Avoid  alcohol.  Rest as needed. Contact a health care provider if:  You have new symptoms.  You cough up pus.  Your cough does not get better after 2-3 weeks, or your cough gets worse.  You cannot control your cough with suppressant medicines and you are losing sleep.  You develop pain that is getting worse or pain that is not controlled with pain medicines.  You have a fever.  You have unexplained weight loss.  You have night sweats. Get help right away if:  You cough up blood.  You have difficulty breathing.  Your heartbeat is very fast. This information is not intended to replace advice given to you by your health care provider. Make sure you discuss any questions you have with your health care provider. Document Released: 08/11/2010 Document Revised: 07/21/2015 Document Reviewed: 04/21/2014 Elsevier Interactive Patient Education  2019 [employer] 65 Years and Older, Male Preventive care refers to lifestyle choices and visits with your health care provider that can promote health and wellness. What does preventive care include?   A yearly physical exam. This is also called an annual well check.  Dental exams once or twice a year.  Routine eye exams. Ask your health care provider how often you should have your eyes checked.  Personal lifestyle choices, including: ? Daily care of your teeth and gums. ? Regular physical activity. ? Eating a healthy diet. ? Avoiding tobacco and drug use. ? Limiting alcohol use. ? Practicing safe sex. ? Taking low doses of aspirin every day. ? Taking vitamin and mineral supplements as recommended by your health care provider. What happens during an annual well check? The services and screenings done by your health care provider during your annual well check will depend on your age, overall health, lifestyle risk factors, and family history of disease. Counseling Your health care provider may ask you questions about  your:  Alcohol use.  Tobacco use.  Drug use.  Emotional well-being.  Home and relationship well-being.  Sexual activity.  Eating habits.  History of falls.  Memory and ability to understand (cognition).  Work and work Statistician. Screening You may have the following tests or measurements:  Height, weight, and BMI.  Blood pressure.  Lipid and cholesterol levels. These may be checked every 5 years, or more frequently if you are over 46 years old.  Skin check.  Lung cancer screening. You may have this screening every year starting at age 70 if you have a 30-pack-year history of smoking and currently smoke or have quit within the past 15 years.  Colorectal cancer screening. All adults should have this screening starting at age 67 and continuing until age 6. You will have tests every 1-10 years, depending on your results and the type of screening test. People at increased risk should start screening at an earlier age. Screening tests may include: ? Guaiac-based fecal occult blood testing. ? Fecal immunochemical test (FIT). ? Stool DNA test. ? Virtual colonoscopy. ? Sigmoidoscopy. During this test, a flexible tube with a tiny camera (sigmoidoscope) is used to examine your rectum and lower colon. The sigmoidoscope is inserted through your anus into your rectum and lower colon. ? Colonoscopy. During this test, a long, thin, flexible tube with a tiny camera (colonoscope) is used to examine your entire colon and rectum.  Prostate cancer screening. Recommendations will vary depending on your family history and other risks.  Hepatitis C blood test.  Hepatitis B blood test.  Sexually transmitted disease (STD) testing.  Diabetes screening. This is done by checking your blood sugar (glucose) after you have not eaten for a while (fasting). You may have this done every 1-3 years.  Abdominal aortic aneurysm (AAA) screening. You may need this if you are a current or former  smoker.  Osteoporosis. You may be screened starting at age 14 if you are at high risk. Talk with your health care provider about your test results, treatment options, and if necessary, the need for more tests. Vaccines Your health care provider may recommend certain vaccines, such as:  Influenza vaccine. This is recommended every year.  Tetanus, diphtheria, and acellular pertussis (Tdap, Td) vaccine. You may need a Td booster every 10 years.  Varicella vaccine. You may need this if you have not been vaccinated.  Zoster vaccine. You may need this after age 82.  Measles, mumps, and rubella (MMR) vaccine. You may need at least one dose of MMR if you were born in 1957 or later. You may also need a second dose.  Pneumococcal 13-valent conjugate (PCV13) vaccine. One dose is recommended after age 38.  Pneumococcal polysaccharide (PPSV23) vaccine. One dose is recommended after age 27.  Meningococcal vaccine. You may need this if you have certain conditions.  Hepatitis A vaccine. You may need this if you have certain conditions or if you travel or work in places where you may be exposed to hepatitis A.  Hepatitis B vaccine. You may need this if you have certain conditions or if you travel or work in places where you may be exposed to hepatitis B.  Haemophilus influenzae type b (Hib) vaccine. You may need this if you have certain risk factors. Talk to your health care provider about which screenings and vaccines you need and how often you need them. This information is not intended to replace advice given to you by your health care provider. Make sure you discuss any questions you have with your health care provider. Document Released: 03/11/2015 Document Revised: 04/04/2017 Document Reviewed: 12/14/2014 Elsevier Interactive Patient Education  [employer].  If you have lab work done today you will be contacted with your lab results within the next 2 weeks.  If you have not heard from [place]  then please contact us. The fastest way to get your results is to register for My Chart.   IF you received an x-ray today, you will receive an invoice from [facility]. Please contact [facility] at [phone] with questions or concerns regarding your invoice.   IF you received labwork today, you will receive an invoice from [place]. Please contact [facility] at [phone] with questions or concerns regarding your invoice.   Our billing staff will not be able to assist you with questions regarding bills from these companies.  You will be contacted with the lab results as soon as they are available. The fastest way to get your results is to activate your My Chart account. Instructions are located on the last page of this paperwork. If you have not heard from [place] regarding the results in 2 weeks, please contact this office.       I personally performed the services described in this documentation, which was scribed in my presence. The recorded information has been reviewed and considered for accuracy and completeness, addended by me as needed, and agree with information above.  Signed,   Merri Ray, MD [facility] at [place].  03/16/18 11:00 PM

## 2018-03-08 LAB — HEMOGLOBIN A1C
ESTIMATED AVERAGE GLUCOSE: 137 mg/dL
Hgb A1c MFr Bld: 6.4 % — ABNORMAL HIGH (ref 4.8–5.6)

## 2018-03-08 LAB — LIPID PANEL
CHOLESTEROL TOTAL: 221 mg/dL — AB (ref 100–199)
Chol/HDL Ratio: 4.7 ratio (ref 0.0–5.0)
HDL: 47 mg/dL (ref >39)
LDL CALC: 157 mg/dL — AB (ref 0–99)
Triglycerides: 87 mg/dL (ref 0–149)
VLDL Cholesterol Cal: 17 mg/dL (ref 5–40)

## 2018-03-08 LAB — COMPREHENSIVE METABOLIC PANEL
ALT: 13 IU/L (ref 0–44)
AST: 16 IU/L (ref 0–40)
Albumin/Globulin Ratio: 1.9 (ref 1.2–2.2)
Albumin: 4.4 g/dL (ref 3.5–4.8)
Alkaline Phosphatase: 57 IU/L (ref 39–117)
BUN / CREAT RATIO: 13 (ref 10–24)
BUN: 16 mg/dL (ref 8–27)
Bilirubin Total: 0.6 mg/dL (ref 0.0–1.2)
CALCIUM: 9.4 mg/dL (ref 8.6–10.2)
CO2: 22 mmol/L (ref 20–29)
CREATININE: 1.23 mg/dL (ref 0.76–1.27)
Chloride: 101 mmol/L (ref 96–106)
GFR calc Af Amer: 66 mL/min/{1.73_m2} (ref >59)
GFR calc non Af Amer: 57 mL/min/{1.73_m2} — ABNORMAL LOW (ref >59)
GLOBULIN, TOTAL: 2.3 g/dL (ref 1.5–4.5)
GLUCOSE: 126 mg/dL — AB (ref 65–99)
Potassium: 4 mmol/L (ref 3.5–5.2)
SODIUM: 138 mmol/L (ref 134–144)
Total Protein: 6.7 g/dL (ref 6.0–8.5)

## 2018-03-08 LAB — TSH: TSH: 3.31 u[IU]/mL (ref 0.450–4.500)

## 2018-03-16 ENCOUNTER — Encounter: Payer: Self-pay | Admitting: Family Medicine

## 2018-07-28 DIAGNOSIS — H52203 Unspecified astigmatism, bilateral: Secondary | ICD-10-CM | POA: Diagnosis not present

## 2018-07-28 DIAGNOSIS — H2513 Age-related nuclear cataract, bilateral: Secondary | ICD-10-CM | POA: Diagnosis not present

## 2018-07-28 DIAGNOSIS — H25013 Cortical age-related cataract, bilateral: Secondary | ICD-10-CM | POA: Diagnosis not present

## 2018-07-28 DIAGNOSIS — H524 Presbyopia: Secondary | ICD-10-CM | POA: Diagnosis not present

## 2018-08-05 IMAGING — DX DG KNEE COMPLETE 4+V*R*
4 series · 5 of 5 positions shown · non-contrast
Comparison: None in PACs

CLINICAL DATA: Medial knee pain and intermittent swelling. No
report of injury.

EXAM:
RIGHT KNEE - COMPLETE 4+ VIEW

[knee ap]
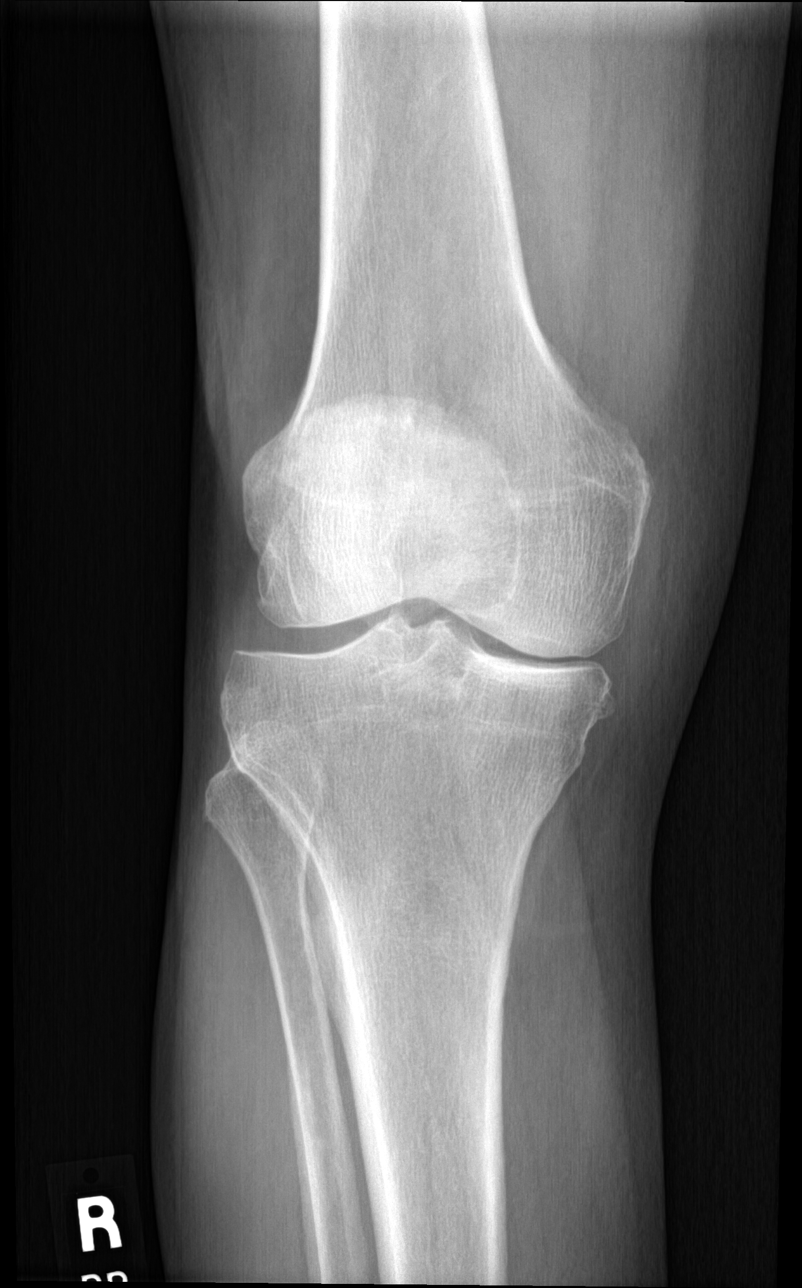

[knee obl (1 of 2)]
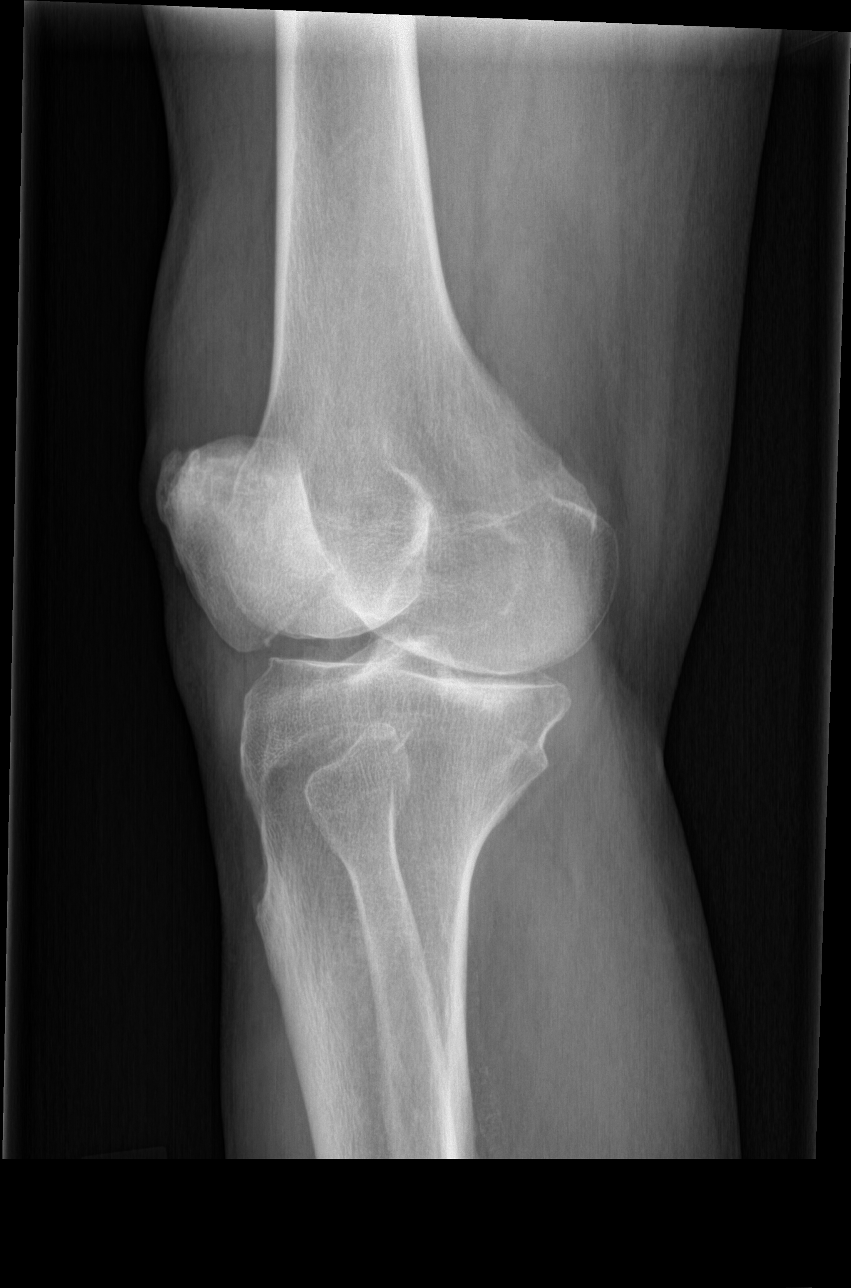

[knee obl (2 of 2)]
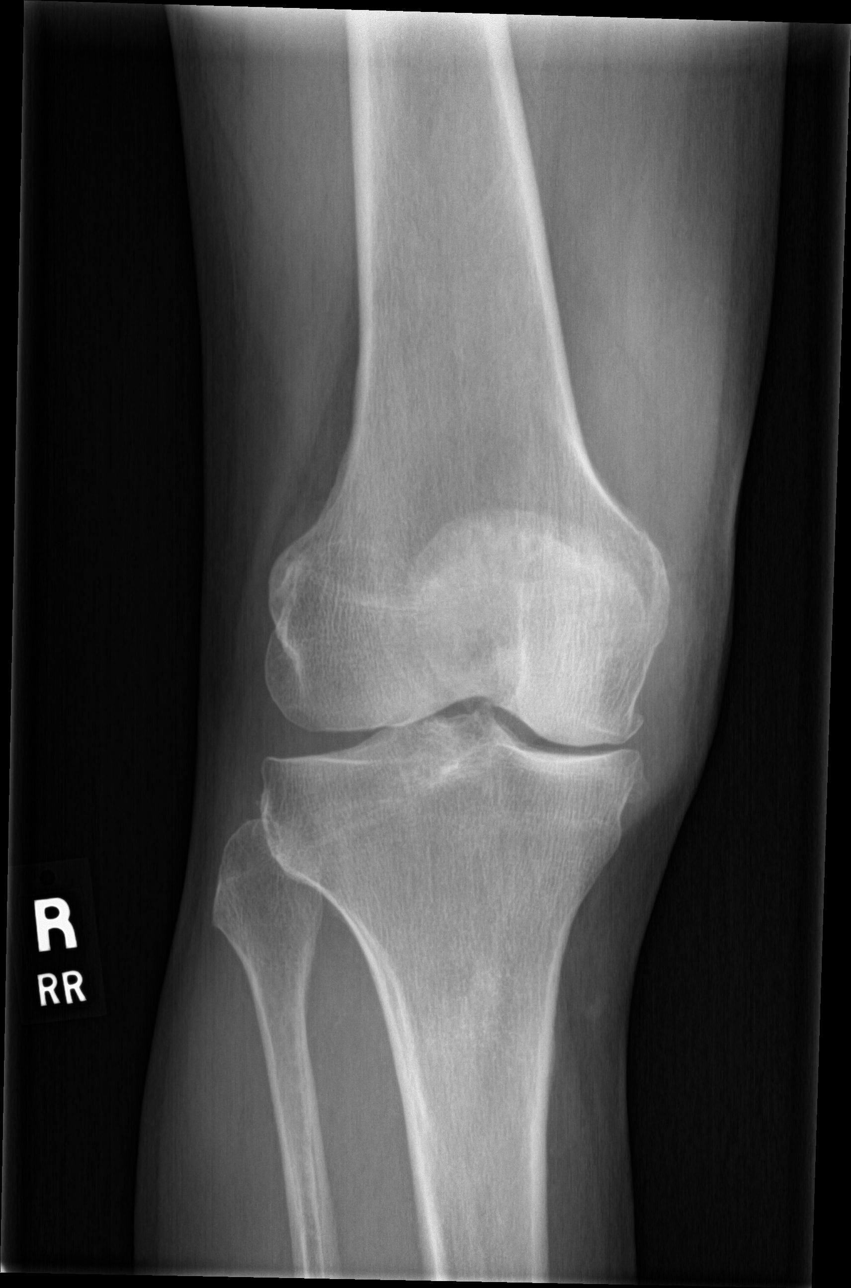

[Series 4: knee lat · 0.14mm/px · 2 of 2 slices shown]
[im 1/2]
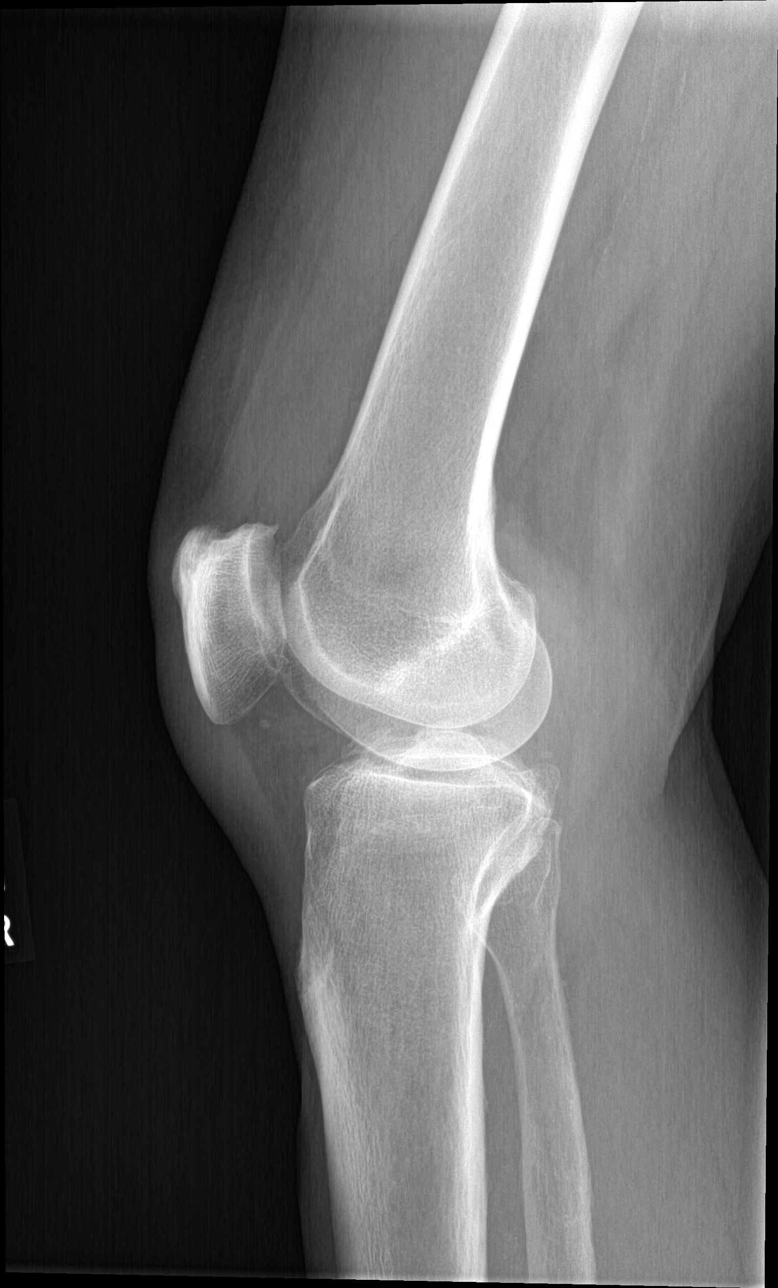
[im 2/2]
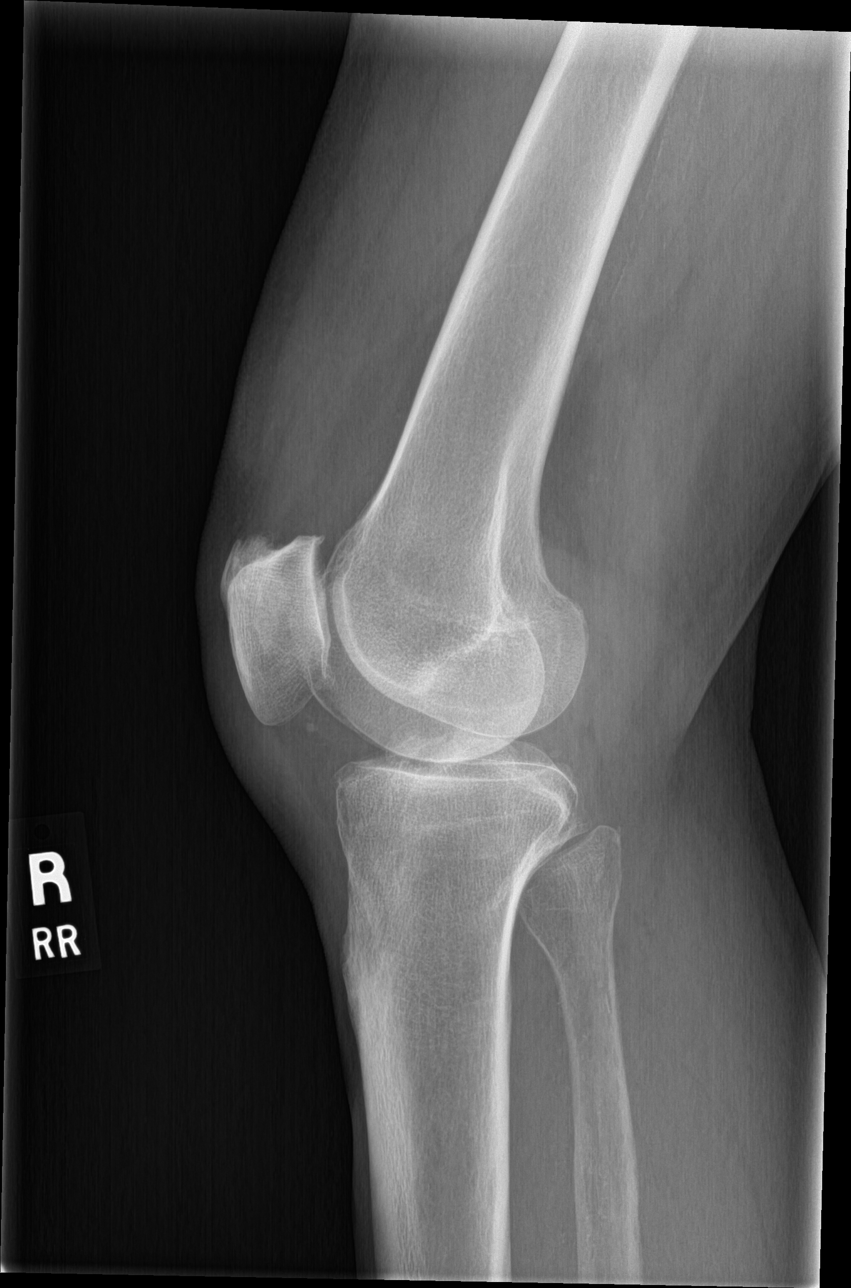

[5 of 5 positions shown; findings below may reference images not displayed]

FINDINGS: The bones are subjectively adequately mineralized. There is moderate
narrowing of the medial joint compartment. The lateral and
patellofemoral joint spaces are well maintained. There is a small
suprapatellar effusion. There is beaking of the tibial spines. Spurs
arise from the superior and inferior articular margins of the
patella. There is no acute fracture or dislocation.
IMPRESSION: There is osteoarthritic change centered on the medial and
patellofemoral compartments. There is a small suprapatellar
effusion. There is no acute bony abnormality.

## 2018-09-01 ENCOUNTER — Other Ambulatory Visit: Payer: Self-pay

## 2018-09-01 ENCOUNTER — Encounter: Payer: Self-pay | Admitting: Family Medicine

## 2018-09-01 ENCOUNTER — Ambulatory Visit (INDEPENDENT_AMBULATORY_CARE_PROVIDER_SITE_OTHER): Payer: Medicare Other | Admitting: Family Medicine

## 2018-09-01 VITALS — BP 128/72 | HR 76 | Temp 97.9°F | Resp 16 | Ht 71.0 in | Wt 204.0 lb

## 2018-09-01 DIAGNOSIS — E785 Hyperlipidemia, unspecified: Secondary | ICD-10-CM

## 2018-09-01 DIAGNOSIS — R7303 Prediabetes: Secondary | ICD-10-CM | POA: Diagnosis not present

## 2018-09-01 DIAGNOSIS — E063 Autoimmune thyroiditis: Secondary | ICD-10-CM

## 2018-09-01 DIAGNOSIS — R41840 Attention and concentration deficit: Secondary | ICD-10-CM | POA: Diagnosis not present

## 2018-09-01 DIAGNOSIS — I10 Essential (primary) hypertension: Secondary | ICD-10-CM | POA: Diagnosis not present

## 2018-09-01 DIAGNOSIS — F329 Major depressive disorder, single episode, unspecified: Secondary | ICD-10-CM

## 2018-09-01 DIAGNOSIS — F341 Dysthymic disorder: Secondary | ICD-10-CM

## 2018-09-01 MED ORDER — BUPROPION HCL ER (SR) 100 MG PO TB12: 100.0000 mg | ORAL_TABLET | Freq: Two times a day (BID) | ORAL | 1 refills | Status: DC

## 2018-09-01 MED ORDER — HYDROCHLOROTHIAZIDE 12.5 MG PO CAPS: ORAL_CAPSULE | ORAL | 2 refills | Status: DC

## 2018-09-01 NOTE — Patient Instructions (Addendum)
I will recheck cholesterol today. It may be worth restarting red yeast rice as well as increase in walking and diet modification.   Blood pressure looks better today, so no changes at this time.   I will recheck the prediabetes test, but increase in walking and diet changes should help these numbers as well.   I think some of the concentration difficulties could be related to depression or dysthymia.  Starting low-dose Wellbutrin once per day, up to twice per day if needed should help both depression/mood symptoms as well as concentration as it has been used for ADD as well.  He has a number also for a therapist.  Please follow-up with me in 1 month to discuss symptoms further, sooner if worse. [facility]: [phone]     If you have lab work done today you will be contacted with your lab results within the next 2 weeks.  If you have not heard from [place] then please contact us. The fastest way to get your results is to register for My Chart.   IF you received an x-ray today, you will receive an invoice from [facility]. Please contact [facility] Radiology at [phone] with questions or concerns regarding your invoice.   IF you received labwork today, you will receive an invoice from [place]. Please contact [facility] at [phone] with questions or concerns regarding your invoice.   Our billing staff will not be able to assist you with questions regarding bills from these companies.  You will be contacted with the lab results as soon as they are available. The fastest way to get your results is to activate your My Chart account. Instructions are located on the last page of this paperwork. If you have not heard from [place] regarding the results in 2 weeks, please contact this office.

## 2018-09-01 NOTE — Progress Notes (Signed)
Subjective:    Patient ID: Corey Cervantes, male    DOB: 01/05/1941, 78 y.o.   MRN: [id number]  HPI  Corey Cervantes is a 78 y.o. male Presents today for: Chief Complaint  Patient presents with  . Hyperlipidemia    6 month follow-up    Hyperlipidemia:  Lab Results  Component Value Date   CHOL 221 (H) 03/07/2018   HDL 47 03/07/2018   LDLCALC 157 (H) 03/07/2018   TRIG 87 03/07/2018   CHOLHDL 4.7 03/07/2018   Lab Results  Component Value Date   ALT 13 03/07/2018   AST 16 03/07/2018   ALKPHOS 57 03/07/2018   BILITOT 0.6 03/07/2018  last OV in January.  Intolerant to statins prior.  Elevated cholesterol, but better than prior year. Prior took red yeast rice, off in past 6 months -felt like redundant, but no side effects. Still taking CoQ10.  Schedule is off - not walking as much as he used to, and diet has been off as well. Has been eating more.   Wt Readings from Last 3 Encounters:  09/01/18 204 lb (92.5 kg)  03/07/18 206 lb 12.8 oz (93.8 kg)  03/11/17 200 lb (90.7 kg)   Prediabetes: As above - some dietary changes, plans to improve and increase in walking. . Wt Readings from Last 3 Encounters:  09/01/18 204 lb (92.5 kg)  03/07/18 206 lb 12.8 oz (93.8 kg)  03/11/17 200 lb (90.7 kg)    Lab Results  Component Value Date   HGBA1C 6.4 (H) 03/07/2018   HGBA1C 6.0 (H) 03/11/2017   HGBA1C 6.1 (H) 07/02/2016   Lab Results  Component Value Date   LDLCALC 157 (H) 03/07/2018   CREATININE 1.23 03/07/2018    Hypertension: BP Readings from Last 3 Encounters:  09/01/18 128/72  03/07/18 (!) 144/70  03/11/17 122/70   Lab Results  Component Value Date   CREATININE 1.23 03/07/2018  borderline last OV, but continued in HCTZ 12.5 mg qd.   Attention difficulty: Had been on ritalin may years ago. No recent meds.  Some trouble with concentration since son's business had problems 18 months ago. Feeling down/blue at times, but not depressed.  Has taken Ruby in  past, sometimes licorice to help mood. Stress at times turns to blue feeling/down feeling.  Times of feeling down over past 18 months.  Has not met with therapist.   Has taken Wellbutrin in plast - no known issues.  Alcohol: 2 drinks per day.   6CIT Screen 03/07/2018  What Year? 0 points  What month? 0 points  What time? 0 points  Count back from 20 0 points  Months in reverse 0 points  Repeat phrase 0 points  Total Score 0   Lab Results  Component Value Date   TSH 3.310 03/07/2018     Depression screen [facility] 2/9 09/01/2018 03/07/2018 03/11/2017 08/02/2016 07/02/2016  Decreased Interest 0 0 0 0 0  Down, Depressed, Hopeless 0 0 0 0 0  PHQ - 2 Score 0 0 0 0 0    Plans to take some college classes - CNC for furniture building, 3D printing.  Plans on writing again and painting.  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    Patient Active Problem List   Diagnosis Date Noted  . Hx of adenomatous colonic polyps 08/30/2016  . HTN (hypertension) 05/25/2013  . Other and unspecified hyperlipidemia 05/25/2013  . Hyperglycemia 05/25/2013   Past Medical History:  Diagnosis Date  . Allergy   .  Arthritis   . Cancer [facility]) many years ago    melanoma  . Hx of adenomatous colonic polyps 08/30/2016  . Hyperlipidemia    Past Surgical History:  Procedure Laterality Date  . APPENDECTOMY    . COLON SURGERY    . HERNIA REPAIR    . MELANOMA EXCISION  1984   Allergies  Allergen Reactions  . Codeine Other (See Comments)    NIGHTMARES  . Lipitor [Atorvastatin] Other (See Comments)    Malaise, myalgia.   . Pravastatin Other (See Comments)    Myalgia, malaise.    Prior to Admission medications   Medication Sig Start Date End Date Taking? Authorizing Provider  aspirin EC 81 MG tablet Take 1 tablet (81 mg total) by mouth daily. 05/07/13   Sherren Mocha, MD  b complex vitamins tablet Take 1 tablet by mouth daily.    [provider]  CALCIUM-MAGNESUIUM-ZINC [id number] MG TABS Take 1  tablet by mouth daily.     [provider]  Cholecalciferol (VITAMIN D-3 PO) Take 1,000 mg by mouth daily.    [provider]  CHONDROITIN SULFATE PO Take 1 Dose by mouth daily.    [provider]  co-enzyme Q-10 30 MG capsule Take 30 mg by mouth daily.    [provider]  Ginkgo Biloba Extract 60 MG CAPS Take 1 capsule by mouth daily.     [provider]  hydrochlorothiazide (MICROZIDE) 12.5 MG capsule TAKE 1 CAPSULE BY MOUTH EVERY DAY 03/07/18   Wendie Agreste, MD  Licorice, Glycyrrhiza glabra, (LICORICE PO) Take 1 Dose by mouth daily.     [provider]  Saw Palmetto, Serenoa repens, (SAW PALMETTO PO) Take 1 Dose by mouth daily.     [provider]   Social History   Socioeconomic History  . Marital status: Married    Spouse name: Not on file  . Number of children: Not on file  . Years of education: Not on file  . Highest education level: Not on file  Occupational History  . Occupation: Art gallery manager  Social Needs  . Financial resource strain: Not on file  . Food insecurity    Worry: Not on file    Inability: Not on file  . Transportation needs    Medical: Not on file    Non-medical: Not on file  Tobacco Use  . Smoking status: Former Smoker    Years: 15.00    Types: Cigarettes, Pipe, Cigars    Quit date: 02/26/1981    Years since quitting: 37.5  . Smokeless tobacco: Never Used  Substance and Sexual Activity  . Alcohol use: No    Comment: no use at this time per pt  . Drug use: No  . Sexual activity: Yes  Lifestyle  . Physical activity    Days per week: Not on file    Minutes per session: Not on file  . Stress: Not on file  Relationships  . Social Herbalist on phone: Not on file    Gets together: Not on file    Attends religious service: Not on file    Active member of club or organization: Not on file    Attends meetings of clubs or organizations: Not on file    Relationship status:  Not on file  . Intimate partner violence    Fear of current or ex partner: Not on file    Emotionally abused: Not on file    Physically abused: Not  on file    Forced sexual activity: Not on file  Other Topics Concern  . Not on file  Social History Narrative  . Not on file    Review of Systems     Objective:   Physical Exam Vitals signs reviewed.  Constitutional:      Appearance: He is well-developed.  HENT:     Head: Normocephalic and atraumatic.  Eyes:     Pupils: Pupils are equal, round, and reactive to light.  Neck:     Vascular: No carotid bruit or JVD.  Cardiovascular:     Rate and Rhythm: Normal rate and regular rhythm.     Heart sounds: Normal heart sounds. No murmur.  Pulmonary:     Effort: Pulmonary effort is normal.     Breath sounds: Normal breath sounds. No rales.  Skin:    General: Skin is warm and dry.  Neurological:     Mental Status: He is alert and oriented to person, place, and time.  Psychiatric:        Attention and Perception: Attention normal.        Mood and Affect: Affect is flat.        Speech: Speech normal.        Behavior: Behavior normal.        Thought Content: Thought content normal. Thought content is not paranoid or delusional. Thought content does not include homicidal or suicidal ideation.    Vitals:   09/01/18 0855  BP: 128/72  Pulse: 76  Resp: 16  Temp: 97.9 F (36.6 C)  TempSrc: Oral  SpO2: 98%  Weight: 204 lb (92.5 kg)       Assessment & Plan:   Corey Cervantes is a 78 y.o. male Hyperlipidemia, unspecified hyperlipidemia type - Plan: Lipid panel  -Restarting red yeast rice, continue co-Q10.  Intolerant to statins.  Diet changes, exercise changes should also help.  Prediabetes - Plan: Comprehensive metabolic panel, Hemoglobin A1c  -Recheck A1c, plans on diet changes.  Essential hypertension - Plan: Comprehensive metabolic panel  -Stable.  No changes  Difficulty concentrating - Plan: buPROPion (WELLBUTRIN SR)  100 MG 12 hr tablet Dysthymia - Plan: buPROPion (WELLBUTRIN SR) 100 MG 12 hr tablet, TSH Reactive depression - Plan: buPROPion (WELLBUTRIN SR) 100 MG 12 hr tablet, TSH  -18 months of symptoms, seems to be more reactive due to issues with sounds business and problems the head.  Does report history of ADD.  May have combined attention deficit with depression/dysthymia.  -Recommended meeting with therapist, phone number provided.  -Trial of low-dose Wellbutrin for both depression symptoms and attention deficit.  Start at 100 mg daily, option of twice daily dosing.  Recheck in 1 month.  Chronic lymphocytic thyroiditis - Plan: TSH  -Previous TSH normal, will repeat TSH given new symptoms above, but stable prior.  Meds ordered this encounter  Medications  . buPROPion (WELLBUTRIN SR) 100 MG 12 hr tablet    Sig: Take 1 tablet (100 mg total) by mouth 2 (two) times daily. Start with 1 tab QD for first week or two, then can increase to BID if needed.    Dispense:  60 tablet    Refill:  1   Patient Instructions   I will recheck cholesterol today. It may be worth restarting red yeast rice as well as increase in walking and diet modification.   Blood pressure looks better today, so no changes at this time.   I will recheck the prediabetes test, but increase  in walking and diet changes should help these numbers as well.   I think some of the concentration difficulties could be related to depression or dysthymia.  Starting low-dose Wellbutrin once per day, up to twice per day if needed should help both depression/mood symptoms as well as concentration as it has been used for ADD as well.  He has a number also for a therapist.  Please follow-up with me in 1 month to discuss symptoms further, sooner if worse. [facility]: [phone]     If you have lab work done today you will be contacted with your lab results within the next 2 weeks.  If you have not heard from [place] then please  contact us. The fastest way to get your results is to register for My Chart.   IF you received an x-ray today, you will receive an invoice from [facility]. Please contact [facility] Radiology at [phone] with questions or concerns regarding your invoice.   IF you received labwork today, you will receive an invoice from [facility]. Please contact [facility] at [phone] with questions or concerns regarding your invoice.   Our billing staff will not be able to assist you with questions regarding bills from these companies.  You will be contacted with the lab results as soon as they are available. The fastest way to get your results is to activate your My Chart account. Instructions are located on the last page of this paperwork. If you have not heard from [place] regarding the results in 2 weeks, please contact this office.       Signed,   Merri Ray, MD [facility] [place].  09/01/18 9:32 AM

## 2018-09-02 LAB — COMPREHENSIVE METABOLIC PANEL
ALT: 14 IU/L (ref 0–44)
AST: 20 IU/L (ref 0–40)
Albumin/Globulin Ratio: 1.9 (ref 1.2–2.2)
Albumin: 4.5 g/dL (ref 3.7–4.7)
Alkaline Phosphatase: 51 IU/L (ref 39–117)
BUN/Creatinine Ratio: 13 (ref 10–24)
BUN: 16 mg/dL (ref 8–27)
Bilirubin Total: 0.7 mg/dL (ref 0.0–1.2)
CO2: 21 mmol/L (ref 20–29)
Calcium: 10.1 mg/dL (ref 8.6–10.2)
Chloride: 103 mmol/L (ref 96–106)
Creatinine, Ser: 1.25 mg/dL (ref 0.76–1.27)
GFR calc Af Amer: 64 mL/min/{1.73_m2} (ref >59)
GFR calc non Af Amer: 56 mL/min/{1.73_m2} — ABNORMAL LOW (ref >59)
Globulin, Total: 2.4 g/dL (ref 1.5–4.5)
Glucose: 125 mg/dL — ABNORMAL HIGH (ref 65–99)
Potassium: 4 mmol/L (ref 3.5–5.2)
Sodium: 138 mmol/L (ref 134–144)
Total Protein: 6.9 g/dL (ref 6.0–8.5)

## 2018-09-02 LAB — HEMOGLOBIN A1C
Est. average glucose Bld gHb Est-mCnc: 131 mg/dL
Hgb A1c MFr Bld: 6.2 % — ABNORMAL HIGH (ref 4.8–5.6)

## 2018-09-02 LAB — LIPID PANEL
Chol/HDL Ratio: 4.3 ratio (ref 0.0–5.0)
Cholesterol, Total: 238 mg/dL — ABNORMAL HIGH (ref 100–199)
HDL: 56 mg/dL (ref >39)
LDL Calculated: 164 mg/dL — ABNORMAL HIGH (ref 0–99)
Triglycerides: 91 mg/dL (ref 0–149)
VLDL Cholesterol Cal: 18 mg/dL (ref 5–40)

## 2018-09-02 LAB — TSH: TSH: 2.57 u[IU]/mL (ref 0.450–4.500)

## 2018-09-25 DIAGNOSIS — L821 Other seborrheic keratosis: Secondary | ICD-10-CM | POA: Diagnosis not present

## 2018-09-25 DIAGNOSIS — C44319 Basal cell carcinoma of skin of other parts of face: Secondary | ICD-10-CM | POA: Diagnosis not present

## 2018-09-25 DIAGNOSIS — Z85820 Personal history of malignant melanoma of skin: Secondary | ICD-10-CM | POA: Diagnosis not present

## 2018-09-25 DIAGNOSIS — L820 Inflamed seborrheic keratosis: Secondary | ICD-10-CM | POA: Diagnosis not present

## 2018-09-25 DIAGNOSIS — C44712 Basal cell carcinoma of skin of right lower limb, including hip: Secondary | ICD-10-CM | POA: Diagnosis not present

## 2018-09-25 DIAGNOSIS — C44612 Basal cell carcinoma of skin of right upper limb, including shoulder: Secondary | ICD-10-CM | POA: Diagnosis not present

## 2018-09-25 DIAGNOSIS — L570 Actinic keratosis: Secondary | ICD-10-CM | POA: Diagnosis not present

## 2018-10-02 ENCOUNTER — Ambulatory Visit: Payer: Medicare Other | Admitting: Family Medicine

## 2018-10-08 DIAGNOSIS — C44329 Squamous cell carcinoma of skin of other parts of face: Secondary | ICD-10-CM | POA: Diagnosis not present

## 2018-10-08 DIAGNOSIS — C44319 Basal cell carcinoma of skin of other parts of face: Secondary | ICD-10-CM | POA: Diagnosis not present

## 2018-10-09 ENCOUNTER — Ambulatory Visit (INDEPENDENT_AMBULATORY_CARE_PROVIDER_SITE_OTHER): Payer: Medicare Other | Admitting: Family Medicine

## 2018-10-09 ENCOUNTER — Other Ambulatory Visit: Payer: Self-pay

## 2018-10-09 ENCOUNTER — Encounter: Payer: Self-pay | Admitting: Family Medicine

## 2018-10-09 VITALS — BP 132/71 | HR 71 | Temp 97.6°F | Resp 14 | Wt 202.0 lb

## 2018-10-09 DIAGNOSIS — R41840 Attention and concentration deficit: Secondary | ICD-10-CM | POA: Diagnosis not present

## 2018-10-09 DIAGNOSIS — F329 Major depressive disorder, single episode, unspecified: Secondary | ICD-10-CM

## 2018-10-09 NOTE — Progress Notes (Signed)
Subjective:    Patient ID: Corey Cervantes, male    DOB: 1940-11-20, 78 y.o.   MRN: [id number]  HPI Corey Cervantes is a 78 y.o. male Presents today for: Chief Complaint  Patient presents with  . focus    Here today  for f/u on concentration issus. Concentration is much better since the stress level is lowered   Depression/dysthymia/difficulty concentrating: Discussed on July 6 visit.  Reported 18 months of symptoms at that time, thought to be more reactive situational stress.  Reported history of ADD, but thought to have more attention difficulty with depression/dysthymia.  Recommended meeting with therapist, number provided.  Low-dose Wellbutrin was started for both depression symptoms and possible underlying attention deficit disorder.  Initially 100 mg daily with option of twice daily dosing.  Feels like concentration is better.  Did not meet with therapist, but feels better.  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  That was a big stressor.  Feels a lot better.  Not feeling depressed.  Walking some with music has helped.  Taking wellbutrin - 1 each morning - helps, no new side effects. Has not needed to take 2.  Researching issues about age has been helpful as well.   Lab Results  Component Value Date   TSH 2.570 09/01/2018   Depression screen [facility] 2/9 10/09/2018 09/01/2018 03/07/2018 03/11/2017 08/02/2016  Decreased Interest 0 0 0 0 0  Down, Depressed, Hopeless 0 0 0 0 0  PHQ - 2 Score 0 0 0 0 0   6CIT Screen 03/07/2018  What Year? 0 points  What month? 0 points  What time? 0 points  Count back from 20 0 points  Months in reverse 0 points  Repeat phrase 0 points  Total Score 0      Patient Active Problem List   Diagnosis Date Noted  . Hx of adenomatous colonic polyps 08/30/2016  . HTN (hypertension) 05/25/2013  . Other and unspecified hyperlipidemia 05/25/2013  . Hyperglycemia 05/25/2013   Past Medical History:  Diagnosis Date  . Allergy   .  Arthritis   . Cancer [facility]) many years ago    melanoma  . Hx of adenomatous colonic polyps 08/30/2016  . Hyperlipidemia    Past Surgical History:  Procedure Laterality Date  . APPENDECTOMY    . COLON SURGERY    . HERNIA REPAIR    . MELANOMA EXCISION  1984   Allergies  Allergen Reactions  . Codeine Other (See Comments)    NIGHTMARES  . Lipitor [Atorvastatin] Other (See Comments)    Malaise, myalgia.   . Pravastatin Other (See Comments)    Myalgia, malaise.    Prior to Admission medications   Medication Sig Start Date End Date Taking? Authorizing Provider  aspirin EC 81 MG tablet Take 1 tablet (81 mg total) by mouth daily. 05/07/13  Yes Sherren Mocha, MD  b complex vitamins tablet Take 1 tablet by mouth daily.   Yes [provider]  buPROPion (WELLBUTRIN SR) 100 MG 12 hr tablet Take 1 tablet (100 mg total) by mouth 2 (two) times daily. Start with 1 tab QD for first week or two, then can increase to BID if needed. 09/01/18  Yes Wendie Agreste, MD  CALCIUM-MAGNESUIUM-ZINC [phone] MG TABS Take 1 tablet by mouth daily.    Yes [provider]  Cholecalciferol (VITAMIN D-3 PO) Take 1,000 mg by mouth daily.   Yes [provider]  CHONDROITIN SULFATE PO Take 1 Dose by  mouth daily.   Yes [provider]  co-enzyme Q-10 30 MG capsule Take 30 mg by mouth daily.   Yes [provider]  Ginkgo Biloba Extract 60 MG CAPS Take 1 capsule by mouth daily.    Yes [provider]  hydrochlorothiazide (MICROZIDE) 12.5 MG capsule TAKE 1 CAPSULE BY MOUTH EVERY DAY 09/01/18  Yes Wendie Agreste, MD  Licorice, Glycyrrhiza glabra, (LICORICE PO) Take 1 Dose by mouth daily.    Yes [provider]  Saw Palmetto, Serenoa repens, (SAW PALMETTO PO) Take 1 Dose by mouth daily.    Yes [provider]   Social History   Socioeconomic History  . Marital status: Married    Spouse name: Not on file  . Number of children: Not on file  . Years  of education: Not on file  . Highest education level: Not on file  Occupational History  . Occupation: Art gallery manager  Social Needs  . Financial resource strain: Not on file  . Food insecurity    Worry: Not on file    Inability: Not on file  . Transportation needs    Medical: Not on file    Non-medical: Not on file  Tobacco Use  . Smoking status: Former Smoker    Years: 15.00    Types: Cigarettes, Pipe, Cigars    Quit date: 02/26/1981    Years since quitting: 37.6  . Smokeless tobacco: Never Used  Substance and Sexual Activity  . Alcohol use: No    Comment: no use at this time per pt  . Drug use: No  . Sexual activity: Yes  Lifestyle  . Physical activity    Days per week: Not on file    Minutes per session: Not on file  . Stress: Not on file  Relationships  . Social Herbalist on phone: Not on file    Gets together: Not on file    Attends religious service: Not on file    Active member of club or organization: Not on file    Attends meetings of clubs or organizations: Not on file    Relationship status: Not on file  . Intimate partner violence    Fear of current or ex partner: Not on file    Emotionally abused: Not on file    Physically abused: Not on file    Forced sexual activity: Not on file  Other Topics Concern  . Not on file  Social History Narrative  . Not on file    Review of Systems Per HPI.     Objective:   Physical Exam Vitals signs reviewed.  Constitutional:      Appearance: He is well-developed.  HENT:     Head: Normocephalic and atraumatic.  Eyes:     Pupils: Pupils are equal, round, and reactive to light.  Cardiovascular:     Rate and Rhythm: Normal rate and regular rhythm.  No extrasystoles are present.    Heart sounds: Normal heart sounds. No murmur.  Pulmonary:     Effort: Pulmonary effort is normal.     Breath sounds: Normal breath sounds.  Skin:    General: Skin is warm and dry.  Neurological:     Mental Status: He  is alert and oriented to person, place, and time.  Psychiatric:        Behavior: Behavior normal.        Thought Content: Thought content normal.        Judgment: Judgment  normal.    Vitals:   10/09/18 0916  BP: 132/71  Pulse: 71  Resp: 14  Temp: 97.6 F (36.4 C)  TempSrc: Oral  SpO2: 96%  Weight: 202 lb (91.6 kg)       Assessment & Plan:  Corey Cervantes is a 78 y.o. male Reactive depression  Difficulty concentrating   -Improved symptoms, suspect in part due to medication as well as coping techniques/stressor resolution.   -Continue Wellbutrin same dose.  Option of twice daily dosing if needed, continue stress management techniques and recheck in 5 months for routine labs.  Sooner if worse.   No orders of the defined types were placed in this encounter.  Patient Instructions    No change in meds at this time. I'm glad to hear that things have gotten better. You have the option of higher dosing at 2 per day if needed. Let me know if I can help further.   If you have lab work done today you will be contacted with your lab results within the next 2 weeks.  If you have not heard from [place] then please contact us. The fastest way to get your results is to register for My Chart.   IF you received an x-ray today, you will receive an invoice from [facility]. Please contact [facility] Radiology at [phone] with questions or concerns regarding your invoice.   IF you received labwork today, you will receive an invoice from [place]. Please contact [facility] at [phone] with questions or concerns regarding your invoice.   Our billing staff will not be able to assist you with questions regarding bills from these companies.  You will be contacted with the lab results as soon as they are available. The fastest way to get your results is to activate your My Chart account. Instructions are located on the last page of this paperwork. If you have not heard from [place] regarding  the results in 2 weeks, please contact this office.       Signed,   Merri Ray, MD [facility] [place].  10/09/18 10:04 AM

## 2018-10-09 NOTE — Patient Instructions (Addendum)
  No change in meds at this time. I'm glad to hear that things have gotten better. You have the option of higher dosing at 2 per day if needed. Let me know if I can help further.   If you have lab work done today you will be contacted with your lab results within the next 2 weeks.  If you have not heard from [place] then please contact us. The fastest way to get your results is to register for My Chart.   IF you received an x-ray today, you will receive an invoice from [facility] Radiology. Please contact [facility] Radiology at [phone] with questions or concerns regarding your invoice.   IF you received labwork today, you will receive an invoice from [place]. Please contact [facility] at [phone] with questions or concerns regarding your invoice.   Our billing staff will not be able to assist you with questions regarding bills from these companies.  You will be contacted with the lab results as soon as they are available. The fastest way to get your results is to activate your My Chart account. Instructions are located on the last page of this paperwork. If you have not heard from [place] regarding the results in 2 weeks, please contact this office.

## 2018-10-15 DIAGNOSIS — C44712 Basal cell carcinoma of skin of right lower limb, including hip: Secondary | ICD-10-CM | POA: Diagnosis not present

## 2018-10-31 ENCOUNTER — Other Ambulatory Visit: Payer: Self-pay | Admitting: Family Medicine

## 2018-10-31 DIAGNOSIS — R41840 Attention and concentration deficit: Secondary | ICD-10-CM

## 2018-10-31 DIAGNOSIS — F329 Major depressive disorder, single episode, unspecified: Secondary | ICD-10-CM

## 2018-10-31 DIAGNOSIS — F341 Dysthymic disorder: Secondary | ICD-10-CM

## 2018-10-31 NOTE — Telephone Encounter (Signed)
Forwarding medication refill request to the clinical pool for review. 

## 2018-10-31 NOTE — Telephone Encounter (Signed)
Refill?  Last seen 10/09/18 last filled 09/01/18.   Will not e-scibe for [place]

## 2018-11-16 ENCOUNTER — Other Ambulatory Visit: Payer: Self-pay | Admitting: Family Medicine

## 2018-11-16 DIAGNOSIS — F341 Dysthymic disorder: Secondary | ICD-10-CM

## 2018-11-16 DIAGNOSIS — R41840 Attention and concentration deficit: Secondary | ICD-10-CM

## 2018-11-16 DIAGNOSIS — F329 Major depressive disorder, single episode, unspecified: Secondary | ICD-10-CM

## 2018-11-17 NOTE — Telephone Encounter (Signed)
Requested medication (s) are due for refill today: yes  Requested medication (s) are on the active medication list: yes  Last refill:  11/02/2018  Future visit scheduled: yes  Notes to clinic:  Patient requesting 90 day supply   Requested Prescriptions  Pending Prescriptions Disp Refills   buPROPion (WELLBUTRIN SR) 100 MG 12 hr tablet [Pharmacy Med Name: BUPROPION HCL SR 100 MG TABLET] 180 tablet 1    Sig: START WITH 1 TABLET BY MOUTH FOR 1 TO 2 WEEKS, CAN INCREASE TO TWICE DAILY IF NEEDED     Psychiatry: Antidepressants - bupropion Failed - 11/16/2018 10:53 AM      Failed - Completed PHQ-2 or PHQ-9 in the last 360 days.      Passed - Last BP in normal range    BP Readings from Last 1 Encounters:  10/09/18 132/71         Passed - Valid encounter within last 6 months    Recent Outpatient Visits          1 month ago Reactive depression   [facility] [place], Ranell Patrick, MD   2 months ago Hyperlipidemia, unspecified hyperlipidemia type   [facility] [place], Ranell Patrick, MD   8 months ago Medicare annual wellness visit, subsequent   [facility] [place], Ranell Patrick, MD   1 year ago Hyperlipidemia, unspecified hyperlipidemia type   [facility] [place], Ranell Patrick, MD   2 years ago Right knee pain, unspecified chronicity   [facility] [place], Ranell Patrick, MD      Future Appointments            In 3 months Carlota Raspberry Ranell Patrick, MD [facility] [place], [facility]

## 2018-12-19 ENCOUNTER — Other Ambulatory Visit: Payer: Self-pay

## 2018-12-19 ENCOUNTER — Ambulatory Visit (INDEPENDENT_AMBULATORY_CARE_PROVIDER_SITE_OTHER): Payer: Medicare Other | Admitting: Family Medicine

## 2018-12-19 DIAGNOSIS — Z23 Encounter for immunization: Secondary | ICD-10-CM | POA: Diagnosis not present

## 2019-03-05 ENCOUNTER — Other Ambulatory Visit: Payer: Self-pay | Admitting: Family Medicine

## 2019-03-05 DIAGNOSIS — F329 Major depressive disorder, single episode, unspecified: Secondary | ICD-10-CM

## 2019-03-05 DIAGNOSIS — F341 Dysthymic disorder: Secondary | ICD-10-CM

## 2019-03-05 DIAGNOSIS — R41840 Attention and concentration deficit: Secondary | ICD-10-CM

## 2019-03-05 NOTE — Telephone Encounter (Signed)
Requested medication (s) are due for refill today: yes  Requested medication (s) are on the active medication list: yes  Last refill:  02/11/2019  Future visit scheduled: yes  Notes to clinic:   [place]. DX Code Needed.  Requested Prescriptions  Pending Prescriptions Disp Refills   buPROPion (WELLBUTRIN SR) 100 MG 12 hr tablet [Pharmacy Med Name: BUPROPION HCL SR 100 MG TABLET] 180 tablet 1    Sig: START WITH 1 TABLET BY MOUTH FOR 1 TO 2 WEEKS, CAN INCREASE TO TWICE DAILY IF NEEDED      Psychiatry: Antidepressants - bupropion Passed - 03/05/2019 10:30 AM      Passed - Last BP in normal range    BP Readings from Last 1 Encounters:  10/09/18 132/71          Passed - Valid encounter within last 6 months    Recent Outpatient Visits           2 months ago Need for prophylactic vaccination and inoculation against influenza   [facility], Lilia Argue, MD   4 months ago Reactive depression   [facility], Ranell Patrick, MD   6 months ago Hyperlipidemia, unspecified hyperlipidemia type   [facility], Ranell Patrick, MD   12 months ago Medicare annual wellness visit, subsequent   [facility], Ranell Patrick, MD   1 year ago Hyperlipidemia, unspecified hyperlipidemia type   [facility], Ranell Patrick, MD       Future Appointments             In 6 days Wendie Agreste, MD [facility] at [place], [facility]

## 2019-03-05 NOTE — Telephone Encounter (Signed)
Patient is requesting a refill of the following medications: Requested Prescriptions   Pending Prescriptions Disp Refills   buPROPion (WELLBUTRIN SR) 100 MG 12 hr tablet [Pharmacy Med Name: BUPROPION HCL SR 100 MG TABLET] 180 tablet 1    Sig: START WITH 1 TABLET BY MOUTH FOR 1 TO 2 WEEKS, CAN INCREASE TO TWICE DAILY IF NEEDED    Date of patient request: 03/05/19 Last office visit: 10/09/18 Date of last refill: 11/02/18 Last refill amount: 60 -1rf Follow up time period per chart: 03/11/19

## 2019-03-06 NOTE — Telephone Encounter (Signed)
Wellbutrin refilled, has upcoming appointment.

## 2019-03-11 ENCOUNTER — Encounter: Payer: Self-pay | Admitting: Family Medicine

## 2019-03-11 ENCOUNTER — Ambulatory Visit (INDEPENDENT_AMBULATORY_CARE_PROVIDER_SITE_OTHER): Payer: Medicare Other | Admitting: Family Medicine

## 2019-03-11 ENCOUNTER — Other Ambulatory Visit: Payer: Self-pay

## 2019-03-11 VITALS — BP 134/82 | HR 70 | Temp 98.0°F | Ht 71.0 in | Wt 204.0 lb

## 2019-03-11 DIAGNOSIS — E063 Autoimmune thyroiditis: Secondary | ICD-10-CM

## 2019-03-11 DIAGNOSIS — F329 Major depressive disorder, single episode, unspecified: Secondary | ICD-10-CM

## 2019-03-11 DIAGNOSIS — R7303 Prediabetes: Secondary | ICD-10-CM

## 2019-03-11 DIAGNOSIS — E785 Hyperlipidemia, unspecified: Secondary | ICD-10-CM

## 2019-03-11 DIAGNOSIS — R0789 Other chest pain: Secondary | ICD-10-CM

## 2019-03-11 DIAGNOSIS — I10 Essential (primary) hypertension: Secondary | ICD-10-CM

## 2019-03-11 MED ORDER — HYDROCHLOROTHIAZIDE 12.5 MG PO CAPS: ORAL_CAPSULE | ORAL | 2 refills | Status: DC

## 2019-03-11 NOTE — Progress Notes (Signed)
Subjective:  Patient ID: Corey Cervantes, male    DOB: 12-28-1940  Age: 79 y.o. MRN: [id number]  CC:  Chief Complaint  Patient presents with  . Follow-up    pt is following up on lab work and medication. no concers with pt's current medical conditions.    HPI Corey Cervantes presents for   Hyperlipidemia: Intolerant to statins.  Restart red yeast rice, co-Q10 at  July 2020 visit.  Off red yeast rice recently. No side effects, just on other supplements. No specific reason to stop. On CoQ10.  Lab Results  Component Value Date   CHOL 238 (H) 09/01/2018   HDL 56 09/01/2018   LDLCALC 164 (H) 09/01/2018   TRIG 91 09/01/2018   CHOLHDL 4.3 09/01/2018   Lab Results  Component Value Date   ALT 14 09/01/2018   AST 20 09/01/2018   ALKPHOS 51 09/01/2018   BILITOT 0.7 09/01/2018   Hypertension: Hydrochlorothiazide 12.5 mg daily.  Home readings: 130-135/80.  Rare dizziness with BP at 112 after increased exertion prior. No chest pain.  Chest pressure with vigorous activity outside last experienced 1 month ago. Resolved with rest after few minutes. "awareness of heart".  No arm radiation. No n/v/diaphoresis.   Last saw cardiology in 2018. Thought to be more muscular at that time. Prior stress testing in 2015 - normal stress nuclear study.   Exercise 1-2 miles fast paced walk few days per week - no chest pain (only with rigorous work in yard) BP Readings from Last 3 Encounters:  03/11/19 134/82  10/09/18 132/71  09/01/18 128/72   Lab Results  Component Value Date   CREATININE 1.25 09/01/2018   Depression: Suspected reactive depression, treated with Wellbutrin, also treating with some attentional symptoms.  Improving in August. Still on one per day.  Feels like prior stressors are going better.  Focus is better.  Depression screen [facility] 2/9 03/11/2019 10/09/2018 09/01/2018 03/07/2018 03/11/2017  Decreased Interest 0 0 0 0 0  Down, Depressed, Hopeless 0 0 0 0 0  PHQ - 2 Score 0 0 0 0  0    Prediabetes:  Lab Results  Component Value Date   HGBA1C 6.2 (H) 09/01/2018    Wt Readings from Last 3 Encounters:  03/11/19 204 lb (92.5 kg)  10/09/18 202 lb (91.6 kg)  09/01/18 204 lb (92.5 kg)       History Patient Active Problem List   Diagnosis Date Noted  . Hx of adenomatous colonic polyps 08/30/2016  . HTN (hypertension) 05/25/2013  . Other and unspecified hyperlipidemia 05/25/2013  . Hyperglycemia 05/25/2013   Past Medical History:  Diagnosis Date  . Allergy   . Arthritis   . Cancer [facility]) many years ago    melanoma  . Hx of adenomatous colonic polyps 08/30/2016  . Hyperlipidemia    Past Surgical History:  Procedure Laterality Date  . APPENDECTOMY    . COLON SURGERY    . HERNIA REPAIR    . MELANOMA EXCISION  1984   Allergies  Allergen Reactions  . Codeine Other (See Comments)    NIGHTMARES  . Lipitor [Atorvastatin] Other (See Comments)    Malaise, myalgia.   . Pravastatin Other (See Comments)    Myalgia, malaise.    Prior to Admission medications   Medication Sig Start Date End Date Taking? Authorizing Provider  aspirin EC 81 MG tablet Take 1 tablet (81 mg total) by mouth daily. 05/07/13  Yes Sherren Mocha, MD  b complex vitamins tablet Take  1 tablet by mouth daily.   Yes [provider]  buPROPion (WELLBUTRIN SR) 100 MG 12 hr tablet START WITH 1 TABLET BY MOUTH FOR 1 TO 2 WEEKS, CAN INCREASE TO TWICE DAILY IF NEEDED 03/06/19  Yes Wendie Agreste, MD  CALCIUM-MAGNESUIUM-ZINC [id number] MG TABS Take 1 tablet by mouth daily.    Yes [provider]  Cholecalciferol (VITAMIN D-3 PO) Take 1,000 mg by mouth daily.   Yes [provider]  CHONDROITIN SULFATE PO Take 1 Dose by mouth daily.   Yes [provider]  co-enzyme Q-10 30 MG capsule Take 30 mg by mouth daily.   Yes [provider]  Ginkgo Biloba Extract 60 MG CAPS Take 1 capsule by mouth daily.    Yes [provider]  hydrochlorothiazide  (MICROZIDE) 12.5 MG capsule TAKE 1 CAPSULE BY MOUTH EVERY DAY 09/01/18  Yes Wendie Agreste, MD  Saw Palmetto, Serenoa repens, (SAW PALMETTO PO) Take 1 Dose by mouth daily.    Yes [provider]  Licorice, Glycyrrhiza glabra, (LICORICE PO) Take 1 Dose by mouth daily.     [provider]  buPROPion (WELLBUTRIN SR) 100 MG 12 hr tablet Take 1 tablet (100 mg total) by mouth 2 (two) times daily. Start with 1 tab QD for first week or two, then can increase to BID if needed. 09/01/18   Wendie Agreste, MD   Social History   Socioeconomic History  . Marital status: Married    Spouse name: Not on file  . Number of children: Not on file  . Years of education: Not on file  . Highest education level: Not on file  Occupational History  . Occupation: Art gallery manager  Tobacco Use  . Smoking status: Former Smoker    Years: 15.00    Types: Cigarettes, Pipe, Cigars    Quit date: 02/26/1981    Years since quitting: 38.0  . Smokeless tobacco: Never Used  Substance and Sexual Activity  . Alcohol use: No    Comment: no use at this time per pt  . Drug use: No  . Sexual activity: Yes  Other Topics Concern  . Not on file  Social History Narrative  . Not on file   Social Determinants of Health   Financial Resource Strain:   . Difficulty of Paying Living Expenses: Not on file  Food Insecurity:   . Worried About Charity fundraiser in the Last Year: Not on file  . Ran Out of Food in the Last Year: Not on file  Transportation Needs:   . Lack of Transportation (Medical): Not on file  . Lack of Transportation (Non-Medical): Not on file  Physical Activity:   . Days of Exercise per Week: Not on file  . Minutes of Exercise per Session: Not on file  Stress:   . Feeling of Stress : Not on file  Social Connections:   . Frequency of Communication with Friends and Family: Not on file  . Frequency of Social Gatherings with Friends and Family: Not on file  . Attends Religious Services:  Not on file  . Active Member of Clubs or Organizations: Not on file  . Attends Archivist Meetings: Not on file  . Marital Status: Not on file  Intimate Partner Violence:   . Fear of Current or Ex-Partner: Not on file  . Emotionally Abused: Not on file  . Physically Abused: Not on file  . Sexually Abused: Not on file    Review  of Systems  Constitutional: Negative for fatigue and unexpected weight change.  Eyes: Negative for visual disturbance.  Respiratory: Positive for chest tightness (pressure as above. ). Negative for cough and shortness of breath.   Cardiovascular: Negative for chest pain, palpitations and leg swelling.  Gastrointestinal: Negative for abdominal pain and blood in stool.  Neurological: Positive for dizziness (rare as above. ). Negative for light-headedness and headaches.     Objective:   Vitals:   03/11/19 0923 03/11/19 0928  BP: (!) 148/81 134/82  Pulse: 70   Temp: 98 F (36.7 C)   TempSrc: Temporal   SpO2: 95%   Weight: 204 lb (92.5 kg)   Height: 5\' 11"  (1.803 m)      Physical Exam Vitals reviewed.  Constitutional:      Appearance: Corey Cervantes is well-developed.  HENT:     Head: Normocephalic and atraumatic.  Eyes:     Pupils: Pupils are equal, round, and reactive to light.  Neck:     Vascular: No carotid bruit or JVD.  Cardiovascular:     Rate and Rhythm: Normal rate and regular rhythm.     Heart sounds: Normal heart sounds. No murmur.  Pulmonary:     Effort: Pulmonary effort is normal.     Breath sounds: Normal breath sounds. No rales.  Skin:    General: Skin is warm and dry.  Neurological:     Mental Status: Corey Cervantes is alert and oriented to person, place, and time.    EKG: Sinus rhythm, low voltage.  No apparent significant change from 2018   Assessment & Plan:  Corey Cervantes is a 79 y.o. male . Hyperlipidemia, unspecified hyperlipidemia type - Plan: Lipid panel, Ambulatory referral to Cardiology  -Intolerant to statins.   Recommended restart red yeast rice, then follow-up lipids in approximately 6 weeks.  Could also consider discussion of other medications with cardiology.  Prediabetes - Plan: Hemoglobin A1c  -Watch diet, exercise.  Check A1c  Essential hypertension - Plan: Comprehensive metabolic panel, EKG [id number], Ambulatory referral to Cardiology Chest pressure - Plan: EKG 12-Lead, Ambulatory referral to Cardiology  -Fleeting chest pressure with significant exertional exercise only.  Previous cardiology evaluation reassuring few years ago, stress testing 5 years ago reassuring.  Refer back to cardiology, does have risk factors for coronary disease including hypertension, age, hyperlipidemia.  ER precautions given, avoid exertional exercise at this time.   Chronic lymphocytic thyroiditis - Plan: TSH  Reactive depression  -Stable, continue Wellbutrin same day.  Option of trial off medication in the next 6 months if still doing well.  No orders of the defined types were placed in this encounter.  Patient Instructions    Restart red yeast rice, then return for lab visit in 6-8 weeks. No other med changes.   No medication changes for now otherwise.  I would like you to meet with cardiology with previous chest pressure - I placed a referral.  Avoid any significant exertion exercises for now.  Walking/low intensity activity is okay if you do not have any symptoms.  Return to the clinic or go to the nearest emergency room if any of your symptoms worsen or new symptoms occur.   If you have lab work done today you will be contacted with your lab results within the next 2 weeks.  If you have not heard from [place] then please contact us. The fastest way to get your results is to register for My Chart.   IF you received an x-ray today, you  will receive an invoice from [facility]. Please contact [facility] at [phone] with questions or concerns regarding your invoice.   IF you received labwork  today, you will receive an invoice from [facility]. Please contact [facility] at [phone] with questions or concerns regarding your invoice.   Our billing staff will not be able to assist you with questions regarding bills from these companies.  You will be contacted with the lab results as soon as they are available. The fastest way to get your results is to activate your My Chart account. Instructions are located on the last page of this paperwork. If you have not heard from [place] regarding the results in 2 weeks, please contact this office.         Signed, Merri Ray, MD [facility]

## 2019-03-11 NOTE — Patient Instructions (Addendum)
  Restart red yeast rice, then return for lab visit in 6-8 weeks. No other med changes.   No medication changes for now otherwise.  I would like you to meet with cardiology with previous chest pressure - I placed a referral.  Avoid any significant exertion exercises for now.  Walking/low intensity activity is okay if you do not have any symptoms.  Return to the clinic or go to the nearest emergency room if any of your symptoms worsen or new symptoms occur.   If you have lab work done today you will be contacted with your lab results within the next 2 weeks.  If you have not heard from [place] then please contact us. The fastest way to get your results is to register for My Chart.   IF you received an x-ray today, you will receive an invoice from [facility]. Please contact [facility] at [phone] with questions or concerns regarding your invoice.   IF you received labwork today, you will receive an invoice from [facility]. Please contact [facility] at [phone] with questions or concerns regarding your invoice.   Our billing staff will not be able to assist you with questions regarding bills from these companies.  You will be contacted with the lab results as soon as they are available. The fastest way to get your results is to activate your My Chart account. Instructions are located on the last page of this paperwork. If you have not heard from [place] regarding the results in 2 weeks, please contact this office.

## 2019-03-12 LAB — LIPID PANEL
Chol/HDL Ratio: 4.4 ratio (ref 0.0–5.0)
Cholesterol, Total: 232 mg/dL — ABNORMAL HIGH (ref 100–199)
HDL: 53 mg/dL (ref >39)
LDL Chol Calc (NIH): 156 mg/dL — ABNORMAL HIGH (ref 0–99)
Triglycerides: 126 mg/dL (ref 0–149)
VLDL Cholesterol Cal: 23 mg/dL (ref 5–40)

## 2019-03-12 LAB — COMPREHENSIVE METABOLIC PANEL
ALT: 14 IU/L (ref 0–44)
AST: 21 IU/L (ref 0–40)
Albumin/Globulin Ratio: 1.8 (ref 1.2–2.2)
Albumin: 4.3 g/dL (ref 3.7–4.7)
Alkaline Phosphatase: 58 IU/L (ref 39–117)
BUN/Creatinine Ratio: 11 (ref 10–24)
BUN: 15 mg/dL (ref 8–27)
Bilirubin Total: 0.6 mg/dL (ref 0.0–1.2)
CO2: 22 mmol/L (ref 20–29)
Calcium: 9 mg/dL (ref 8.6–10.2)
Chloride: 102 mmol/L (ref 96–106)
Creatinine, Ser: 1.36 mg/dL — ABNORMAL HIGH (ref 0.76–1.27)
GFR calc Af Amer: 58 mL/min/{1.73_m2} — ABNORMAL LOW (ref >59)
GFR calc non Af Amer: 50 mL/min/{1.73_m2} — ABNORMAL LOW (ref >59)
Globulin, Total: 2.4 g/dL (ref 1.5–4.5)
Glucose: 132 mg/dL — ABNORMAL HIGH (ref 65–99)
Potassium: 3.9 mmol/L (ref 3.5–5.2)
Sodium: 138 mmol/L (ref 134–144)
Total Protein: 6.7 g/dL (ref 6.0–8.5)

## 2019-03-12 LAB — HEMOGLOBIN A1C
Est. average glucose Bld gHb Est-mCnc: 128 mg/dL
Hgb A1c MFr Bld: 6.1 % — ABNORMAL HIGH (ref 4.8–5.6)

## 2019-03-12 LAB — TSH: TSH: 2.22 u[IU]/mL (ref 0.450–4.500)

## 2019-03-13 ENCOUNTER — Ambulatory Visit: Payer: Medicare Other | Admitting: Physician Assistant

## 2019-04-29 DIAGNOSIS — Z85828 Personal history of other malignant neoplasm of skin: Secondary | ICD-10-CM | POA: Diagnosis not present

## 2019-04-29 DIAGNOSIS — L308 Other specified dermatitis: Secondary | ICD-10-CM | POA: Diagnosis not present

## 2019-04-29 DIAGNOSIS — Z85820 Personal history of malignant melanoma of skin: Secondary | ICD-10-CM | POA: Diagnosis not present

## 2019-04-29 DIAGNOSIS — L720 Epidermal cyst: Secondary | ICD-10-CM | POA: Diagnosis not present

## 2019-04-29 DIAGNOSIS — L570 Actinic keratosis: Secondary | ICD-10-CM | POA: Diagnosis not present

## 2019-05-01 ENCOUNTER — Ambulatory Visit: Payer: Medicare Other | Attending: Internal Medicine

## 2019-05-01 DIAGNOSIS — Z23 Encounter for immunization: Secondary | ICD-10-CM | POA: Insufficient documentation

## 2019-05-01 NOTE — Progress Notes (Signed)
   Covid-19 Vaccination Clinic  Name:  JRAKE BANKES    MRN: [id number] DOB: 09/04/1940  05/01/2019  Mr. Stpaul was observed post Covid-19 immunization for 15 minutes without incident. He was provided with Vaccine Information Sheet and instruction to access the V-Safe system.   Mr. Hellard was instructed to call 911 with any severe reactions post vaccine: Marland Kitchen Difficulty breathing  . Swelling of face and throat  . A fast heartbeat  . A bad rash all over body  . Dizziness and weakness   Immunizations Administered    Name Date Dose VIS Date Route   Pfizer COVID-19 Vaccine 05/01/2019 10:15 AM 0.3 mL 02/06/2019 Intramuscular   Manufacturer: [place]   Lot: [id number]   [place]: [id number]

## 2019-05-27 ENCOUNTER — Ambulatory Visit: Payer: Medicare Other | Attending: Internal Medicine

## 2019-05-27 DIAGNOSIS — Z23 Encounter for immunization: Secondary | ICD-10-CM

## 2019-05-27 NOTE — Progress Notes (Signed)
   Covid-19 Vaccination Clinic  Name:  Corey Cervantes    MRN: [id number] DOB: 27-Feb-1940  05/27/2019  Mr. Standing was observed post Covid-19 immunization for 15 minutes without incident. He was provided with Vaccine Information Sheet and instruction to access the V-Safe system.   [name] was instructed to call 911 with any severe reactions post vaccine: Marland Kitchen Difficulty breathing  . Swelling of face and throat  . A fast heartbeat  . A bad rash all over body  . Dizziness and weakness   Immunizations Administered    Name Date Dose VIS Date Route   Pfizer COVID-19 Vaccine 05/27/2019 10:29 AM 0.3 mL 02/06/2019 Intramuscular   Manufacturer: [place]   Lot: [id number]   [place]: [id number]

## 2019-06-08 ENCOUNTER — Other Ambulatory Visit: Payer: Self-pay

## 2019-06-08 ENCOUNTER — Ambulatory Visit: Payer: Medicare Other | Admitting: Cardiovascular Disease

## 2019-06-15 ENCOUNTER — Encounter: Payer: Self-pay | Admitting: Cardiovascular Disease

## 2019-06-15 ENCOUNTER — Other Ambulatory Visit: Payer: Self-pay

## 2019-06-15 ENCOUNTER — Ambulatory Visit: Payer: Medicare Other | Admitting: Cardiovascular Disease

## 2019-06-15 VITALS — BP 114/64 | HR 74 | Ht 71.0 in | Wt 201.0 lb

## 2019-06-15 DIAGNOSIS — R079 Chest pain, unspecified: Secondary | ICD-10-CM | POA: Diagnosis not present

## 2019-06-15 DIAGNOSIS — E782 Mixed hyperlipidemia: Secondary | ICD-10-CM | POA: Diagnosis not present

## 2019-06-15 DIAGNOSIS — I10 Essential (primary) hypertension: Secondary | ICD-10-CM

## 2019-06-15 MED ORDER — METOPROLOL TARTRATE 50 MG PO TABS: ORAL_TABLET | ORAL | 0 refills | Status: DC

## 2019-06-15 NOTE — Patient Instructions (Addendum)
Your cardiac CT will be scheduled at one of the below locations:   [facility] [address] [place], [place] [address] [phone]  [facility] [address] [place], [place] [address] [phone]  If scheduled at [facility], please arrive at the [facility] main entrance of [facility] 30 minutes prior to test start time. Proceed to the [facility] Radiology Department (first floor) to check-in and test prep.  If scheduled at [facility], please arrive 15 mins early for check-in and test prep.  Please follow these instructions carefully (unless otherwise directed):  Hold all erectile dysfunction medications at least 3 days (72 hrs) prior to test.  On the Night Before the Test: . Be sure to Drink plenty of water. . Do not consume any caffeinated/decaffeinated beverages or chocolate 12 hours prior to your test. . Do not take any antihistamines 12 hours prior to your test.  On the Day of the Test: . Drink plenty of water. Do not drink any water within one hour of the test. . Do not eat any food 4 hours prior to the test. . You may take your regular medications prior to the test EXCEPT: HOLD hydrochlorothiazide the AM of your test  . Take metoprolol (Lopressor) 50 mg one to two hours prior to test.      After the Test: . Drink plenty of water. . After receiving IV contrast, you may experience a mild flushed feeling. This is normal. . On occasion, you may experience a mild rash up to 24 hours after the test. This is not dangerous. If this occurs, you can take Benadryl 25 mg and increase your fluid intake. . If you experience trouble breathing, this can be serious. If it is severe call 911 IMMEDIATELY. If it is mild, please call our office.   Once we have confirmed authorization from your insurance company, we will call you to set up a date and time for your test.   For  non-scheduling related questions, please contact the cardiac imaging nurse navigator should you have any questions/concerns: Marchia Bond, RN Navigator Cardiac Imaging [facility] [phone] office  For scheduling needs, including cancellations and rescheduling, please call [phone].

## 2019-06-15 NOTE — Progress Notes (Addendum)
Cardiology Office Note:    Date:  06/15/2019   ID:  Corey Cervantes 10-01-1940, MRN [id number]  PCP:  Wendie Agreste, MD  Cardiologist:  Sherren Mocha, MD  Electrophysiologist:  None   Referring MD: Wendie Agreste, MD   Chief Complaint  Patient presents with  . Hypertension  . Chest Pain    History of Present Illness:    Corey Cervantes is a 79 y.o. male with a hx of chest pain hypertension, presenting for follow-up evaluation.  He was last seen here in August 2018.  Cardiovascular risk factors include hypertension and mixed hyperlipidemia.  He has been unable to tolerate statin drugs over the years.  Recent lipids from January 2021 demonstrated cholesterol 232, HDL 53, LDL 156.  The patient is here alone today. He is concerned as he has an 'awareness' of his heart. He reports good BP control on HCTZ. He complains of exertional dyspnea with physical activity. With walking or hard yard work he will experience a 'signal' where he feels his heart. This eases with rest and 'backing off.' With rest or with laying down his symptoms completely resolve.   Past Medical History:  Diagnosis Date  . Allergy   . Arthritis   . Cancer [facility]) many years ago    melanoma  . Hx of adenomatous colonic polyps 08/30/2016  . Hyperlipidemia     Past Surgical History:  Procedure Laterality Date  . APPENDECTOMY    . COLON SURGERY    . HERNIA REPAIR    . MELANOMA EXCISION  1984    Current Medications: Current Meds  Medication Sig  . ALFALFA PO Take 1,200 mg by mouth daily.  . ASHWAGANDHA PO Take 800 mg by mouth daily.  Marland Kitchen aspirin EC 81 MG tablet Take 1 tablet (81 mg total) by mouth daily.  Marland Kitchen b complex vitamins tablet Take 1 tablet by mouth daily.  Marland Kitchen buPROPion (WELLBUTRIN) 100 MG tablet Take 100 mg by mouth daily.  Marland Kitchen CALCIUM-MAGNESUIUM-ZINC 333-133-8.3 MG TABS Take 1 tablet by mouth daily.   . Cholecalciferol (VITAMIN D-3 PO) Take 1,000 mg by mouth daily.  . CHONDROITIN SULFATE PO Take  1 Dose by mouth daily.  Marland Kitchen co-enzyme Q-10 30 MG capsule Take 30 mg by mouth daily.  . Ginkgo Biloba Extract 60 MG CAPS Take 1 capsule by mouth daily.   Marland Kitchen GOLDEN SEAL PO Take 550 mg by mouth daily.  Marland Kitchen HAWTHORN PO Take 500 mg by mouth in the morning, at noon, and at bedtime.  . hydrochlorothiazide (MICROZIDE) 12.5 MG capsule TAKE 1 CAPSULE BY MOUTH EVERY DAY  . KOREAN GINSENG PO Take 300 mg by mouth daily.  Marland Kitchen KRILL OIL PO Take 500 mg by mouth daily.  . Licorice, Glycyrrhiza glabra, (LICORICE PO) Take 1 Dose by mouth daily.   . Saw Palmetto, Serenoa repens, (SAW PALMETTO PO) Take 1 Dose by mouth daily.   . vitamin E 180 MG (400 UNITS) capsule Take 400 Units by mouth daily.   Current Facility-Administered Medications for the 06/15/19 encounter (Office Visit) with Sherren Mocha, MD  Medication  . 0.9 %  sodium chloride infusion     Allergies:   Codeine, Lipitor [atorvastatin], and Pravastatin   Social History   Socioeconomic History  . Marital status: Married    Spouse name: Not on file  . Number of children: Not on file  . Years of education: Not on file  . Highest education level: Not on file  Occupational History  .  Occupation: Art gallery manager  Tobacco Use  . Smoking status: Former Smoker    Years: 15.00    Types: Cigarettes, Pipe, Cigars    Quit date: 02/26/1981    Years since quitting: 38.3  . Smokeless tobacco: Never Used  Substance and Sexual Activity  . Alcohol use: No    Comment: no use at this time per pt  . Drug use: No  . Sexual activity: Yes  Other Topics Concern  . Not on file  Social History Narrative  . Not on file   Social Determinants of Health   Financial Resource Strain:   . Difficulty of Paying Living Expenses:   Food Insecurity:   . Worried About Charity fundraiser in the Last Year:   . Arboriculturist in the Last Year:   Transportation Needs:   . Film/video editor (Medical):   Marland Kitchen Lack of Transportation (Non-Medical):   Physical Activity:    . Days of Exercise per Week:   . Minutes of Exercise per Session:   Stress:   . Feeling of Stress :   Social Connections:   . Frequency of Communication with Friends and Family:   . Frequency of Social Gatherings with Friends and Family:   . Attends Religious Services:   . Active Member of Clubs or Organizations:   . Attends Archivist Meetings:   Marland Kitchen Marital Status:      Family History: The patient's family history includes Dementia in his mother; Heart disease in his father; Multiple sclerosis in his sister. There is no history of Colon cancer.  ROS:   Please see the history of present illness.    All other systems reviewed and are negative.  EKGs/Labs/Other Studies Reviewed:    EKG:  EKG is not ordered today.  The ekg from 03/11/19 is reviewed and shows NSR, baseline artifact, nonspecific IVCD  Recent Labs: 03/11/2019: ALT 14; BUN 15; Creatinine, Ser 1.36; Potassium 3.9; Sodium 138; TSH 2.220  Recent Lipid Panel    Component Value Date/Time   CHOL 232 (H) 03/11/2019 1200   TRIG 126 03/11/2019 1200   HDL 53 03/11/2019 1200   CHOLHDL 4.4 03/11/2019 1200   CHOLHDL 4.7 11/03/2015 1005   VLDL 14 11/03/2015 1005   LDLCALC 156 (H) 03/11/2019 1200    Physical Exam:    VS:  BP 114/64   Pulse 74   Ht 5\' 11"  (1.803 m)   Wt 201 lb (91.2 kg)   SpO2 97%   BMI 28.03 kg/m     Wt Readings from Last 3 Encounters:  06/15/19 201 lb (91.2 kg)  03/11/19 204 lb (92.5 kg)  10/09/18 202 lb (91.6 kg)     GEN: Well nourished, well developed in no acute distress HEENT: Normal NECK: No JVD; No carotid bruits LYMPHATICS: No lymphadenopathy CARDIAC: RRR, no murmurs, rubs, gallops RESPIRATORY:  Clear to auscultation without rales, wheezing or rhonchi  ABDOMEN: Soft, non-tender, non-distended MUSCULOSKELETAL:  No edema; No deformity  SKIN: Warm and dry NEUROLOGIC:  Alert and oriented x 3 PSYCHIATRIC:  Normal affect   ASSESSMENT:    1. Essential hypertension   2. Mixed  hyperlipidemia   3. Exertional chest pain   4. Chest pain, unspecified type    PLAN:    In order of problems listed above:  1. Blood pressure is currently well controlled on hydrochlorothiazide.  Most recent labs reviewed with creatinine 1.36 and potassium 3.9. 2. Discussed lipids today at length.  He is intolerant  of multiple statin drugs.  Could consider PCSK9 inhibitor.  See discussion below. 3. The patient's symptoms are concerning for angina.  He has heart disease on his father side of the family with multiple male relatives died in their late 73s of heart disease.  I have recommended a gated coronary CTA for further evaluation.  We discussed the fact that his CTA study gives anatomic information and depending on the findings, would consider proceeding with further medical therapy, more aggressive lipid-lowering with a PCSK9 inhibitor, or even cardiac catheterization if he has high risk anatomy.  Further plans based on the results of his CTA study.   Medication Adjustments/Labs and Tests Ordered: Current medicines are reviewed at length with the patient today.  Concerns regarding medicines are outlined above.  Orders Placed This Encounter  Procedures  . CT CORONARY MORPH W/CTA COR W/SCORE W/CA W/CM &/OR WO/CM  . CT CORONARY FRACTIONAL FLOW RESERVE DATA PREP  . CT CORONARY FRACTIONAL FLOW RESERVE FLUID ANALYSIS  . Basic metabolic panel   Meds ordered this encounter  Medications  . metoprolol tartrate (LOPRESSOR) 50 MG tablet    Sig: Take 1 tablet 1-2 hours prior to your CT appointment time.    Dispense:  1 tablet    Refill:  0    Patient Instructions  Your cardiac CT will be scheduled at one of the below locations:   [facility] [address] [place], [place] [address] [phone]  [place] [address] [place], [place] [address] [phone]  If scheduled at [facility], please arrive at  the [facility] main entrance of [facility] 30 minutes prior to test start time. Proceed to the [facility] Radiology Department (first floor) to check-in and test prep.  If scheduled at [facility], please arrive 15 mins early for check-in and test prep.  Please follow these instructions carefully (unless otherwise directed):  Hold all erectile dysfunction medications at least 3 days (72 hrs) prior to test.  On the Night Before the Test: . Be sure to Drink plenty of water. . Do not consume any caffeinated/decaffeinated beverages or chocolate 12 hours prior to your test. . Do not take any antihistamines 12 hours prior to your test.  On the Day of the Test: . Drink plenty of water. Do not drink any water within one hour of the test. . Do not eat any food 4 hours prior to the test. . You may take your regular medications prior to the test EXCEPT: HOLD hydrochlorothiazide the AM of your test  . Take metoprolol (Lopressor) 50 mg one to two hours prior to test.      After the Test: . Drink plenty of water. . After receiving IV contrast, you may experience a mild flushed feeling. This is normal. . On occasion, you may experience a mild rash up to 24 hours after the test. This is not dangerous. If this occurs, you can take Benadryl 25 mg and increase your fluid intake. . If you experience trouble breathing, this can be serious. If it is severe call [phone] IMMEDIATELY. If it is mild, please call our office.   Once we have confirmed authorization from your insurance company, we will call you to set up a date and time for your test.   For non-scheduling related questions, please contact the cardiac imaging nurse navigator should you have any questions/concerns: Marchia Bond, RN Navigator Cardiac Imaging [facility] [phone] office  For scheduling needs, including cancellations and rescheduling, please call [phone].        Signed, Sherren Mocha, MD  06/15/2019 12:52 PM    [facility]  ADDENDUM CT scan results reviewed with the patient.  He has significant stenosis of the LAD and left circumflex.  The LAD has a positive CT-FFR result of 0.73 in the mid vessel, suggestive of severe proximal vessel stenosis.  His symptoms are unchanged.  I reviewed findings in detail with the patient as well as further diagnostic and treatment options.  We agreed that it is appropriate to pursue cardiac catheterization and possible PCI.  I would like him to start clopidogrel 75 mg daily. I have reviewed the risks, indications, and alternatives to cardiac catheterization, possible angioplasty, and stenting with the patient. Risks include but are not limited to bleeding, infection, vascular injury, stroke, myocardial infection, arrhythmia, kidney injury, radiation-related injury in the case of prolonged fluoroscopy use, emergency cardiac surgery, and death. The patient understands the risks of serious complication is 1-2 in [id number] with diagnostic cardiac cath and 1-2% or less with angioplasty/stenting.   Sherren Mocha 07/13/2019 12:53 PM

## 2019-06-30 ENCOUNTER — Other Ambulatory Visit: Payer: Self-pay

## 2019-06-30 ENCOUNTER — Other Ambulatory Visit: Payer: Medicare Other

## 2019-06-30 DIAGNOSIS — R079 Chest pain, unspecified: Secondary | ICD-10-CM

## 2019-06-30 LAB — BASIC METABOLIC PANEL
BUN/Creatinine Ratio: 18 (ref 10–24)
BUN: 22 mg/dL (ref 8–27)
CO2: 21 mmol/L (ref 20–29)
Calcium: 9.3 mg/dL (ref 8.6–10.2)
Chloride: 103 mmol/L (ref 96–106)
Creatinine, Ser: 1.23 mg/dL (ref 0.76–1.27)
GFR calc Af Amer: 65 mL/min/{1.73_m2} (ref >59)
GFR calc non Af Amer: 56 mL/min/{1.73_m2} — ABNORMAL LOW (ref >59)
Glucose: 118 mg/dL — ABNORMAL HIGH (ref 65–99)
Potassium: 4.2 mmol/L (ref 3.5–5.2)
Sodium: 137 mmol/L (ref 134–144)

## 2019-07-02 ENCOUNTER — Telehealth (HOSPITAL_COMMUNITY): Payer: Self-pay | Admitting: Emergency Medicine

## 2019-07-02 NOTE — Telephone Encounter (Signed)
Attempted to call patient regarding upcoming cardiac CT appointment. °Left message on voicemail with name and callback number °Billiejean Schimek RN Navigator Cardiac Imaging °[facility] °[phone] Office °[phone] Cell ° °

## 2019-07-03 ENCOUNTER — Telehealth (HOSPITAL_COMMUNITY): Payer: Self-pay | Admitting: Emergency Medicine

## 2019-07-03 NOTE — Telephone Encounter (Signed)
Reaching out to patient to offer assistance regarding upcoming cardiac imaging study; pt verbalizes understanding of appt date/time, parking situation and where to check in, pre-test NPO status and medications ordered, and verified current allergies; name and call back number provided for further questions should they arise Marchia Bond RN Navigator Cardiac Imaging [facility] [phone] office [phone] cell  Pt verbalized understanding to hold HCTZ and take metoprolol tartrate 2 hr prior to scan

## 2019-07-07 ENCOUNTER — Telehealth: Payer: Self-pay | Admitting: *Deleted

## 2019-07-07 NOTE — Telephone Encounter (Signed)
Schedule AWV.  

## 2019-07-08 ENCOUNTER — Ambulatory Visit (HOSPITAL_COMMUNITY)
Admission: RE | Admit: 2019-07-08 | Discharge: 2019-07-08 | Disposition: A | Discharge status: Home / Self Care | Payer: Medicare Other | Source: Ambulatory Visit | Attending: Cardiovascular Disease | Admitting: Cardiovascular Disease

## 2019-07-08 ENCOUNTER — Encounter (HOSPITAL_COMMUNITY): Payer: Self-pay

## 2019-07-08 ENCOUNTER — Other Ambulatory Visit: Payer: Self-pay

## 2019-07-08 DIAGNOSIS — I700 Atherosclerosis of aorta: Secondary | ICD-10-CM | POA: Insufficient documentation

## 2019-07-08 DIAGNOSIS — R918 Other nonspecific abnormal finding of lung field: Secondary | ICD-10-CM | POA: Insufficient documentation

## 2019-07-08 DIAGNOSIS — R079 Chest pain, unspecified: Secondary | ICD-10-CM

## 2019-07-08 DIAGNOSIS — I2510 Atherosclerotic heart disease of native coronary artery without angina pectoris: Secondary | ICD-10-CM | POA: Diagnosis not present

## 2019-07-08 MED ORDER — IOHEXOL 350 MG/ML SOLN
100.0000 mL | Freq: Once | INTRAVENOUS | Status: AC | PRN
  Administered 2019-07-08: 100 mL via INTRAVENOUS

## 2019-07-08 MED ORDER — NITROGLYCERIN 0.4 MG SL SUBL
0.8000 mg | SUBLINGUAL_TABLET | Freq: Once | SUBLINGUAL | Status: AC
  Administered 2019-07-08: 0.8 mg via SUBLINGUAL

## 2019-07-08 MED ORDER — NITROGLYCERIN 0.4 MG SL SUBL
SUBLINGUAL_TABLET | SUBLINGUAL | Status: AC
  Filled 2019-07-08: qty 2

## 2019-07-08 NOTE — Progress Notes (Signed)
Patient tolerated CT well. Did not want anything to eat or drink after. Ambulated to exit steady gait.  

## 2019-07-13 ENCOUNTER — Telehealth: Payer: Self-pay

## 2019-07-13 DIAGNOSIS — I2510 Atherosclerotic heart disease of native coronary artery without angina pectoris: Secondary | ICD-10-CM | POA: Diagnosis not present

## 2019-07-13 DIAGNOSIS — R079 Chest pain, unspecified: Secondary | ICD-10-CM

## 2019-07-13 MED ORDER — CLOPIDOGREL BISULFATE 75 MG PO TABS
75.0000 mg | ORAL_TABLET | Freq: Every day | ORAL | 3 refills | Status: DC
Start: 2019-07-13 — End: 2020-07-07

## 2019-07-13 MED ORDER — METOPROLOL SUCCINATE ER 25 MG PO TB24: 25.0000 mg | ORAL_TABLET | Freq: Every day | ORAL | 3 refills | Status: DC

## 2019-07-13 MED ORDER — NITROGLYCERIN 0.4 MG SL SUBL: 0.4000 mg | SUBLINGUAL_TABLET | SUBLINGUAL | 1 refills | Status: AC | PRN

## 2019-07-13 MED ORDER — METOPROLOL SUCCINATE ER 50 MG PO TB24
50.0000 mg | ORAL_TABLET | Freq: Every day | ORAL | 3 refills | Status: DC
Start: 2019-07-13 — End: 2019-07-13

## 2019-07-13 NOTE — Telephone Encounter (Signed)
Dr. Burt Knack reviewed results of cCT with patient today and recommendation heart catheterization.  Scheduled the patient for LCP 6/2. The patient will have labs and COVID test 6/1.  Per Dr. Burt Knack, will call in PRN NTG and Toprol 25 mg daily.   Instructions released to MyChart. The patient understood to respond with questions or concerns.

## 2019-07-28 ENCOUNTER — Other Ambulatory Visit: Payer: Self-pay

## 2019-07-28 ENCOUNTER — Other Ambulatory Visit: Payer: Medicare Other | Admitting: *Deleted

## 2019-07-28 ENCOUNTER — Other Ambulatory Visit (HOSPITAL_COMMUNITY)
Admission: RE | Admit: 2019-07-28 | Discharge: 2019-07-28 | Disposition: A | Discharge status: Home / Self Care | Payer: Medicare Other | Source: Ambulatory Visit | Attending: Cardiovascular Disease | Admitting: Cardiovascular Disease

## 2019-07-28 DIAGNOSIS — Z20822 Contact with and (suspected) exposure to covid-19: Secondary | ICD-10-CM | POA: Diagnosis not present

## 2019-07-28 DIAGNOSIS — Z01812 Encounter for preprocedural laboratory examination: Secondary | ICD-10-CM | POA: Insufficient documentation

## 2019-07-28 DIAGNOSIS — R079 Chest pain, unspecified: Secondary | ICD-10-CM

## 2019-07-28 LAB — BASIC METABOLIC PANEL
BUN/Creatinine Ratio: 17 (ref 10–24)
BUN: 22 mg/dL (ref 8–27)
CO2: 29 mmol/L (ref 20–29)
Calcium: 9.4 mg/dL (ref 8.6–10.2)
Chloride: 102 mmol/L (ref 96–106)
Creatinine, Ser: 1.31 mg/dL — ABNORMAL HIGH (ref 0.76–1.27)
GFR calc Af Amer: 60 mL/min/{1.73_m2} (ref >59)
GFR calc non Af Amer: 52 mL/min/{1.73_m2} — ABNORMAL LOW (ref >59)
Glucose: 118 mg/dL — ABNORMAL HIGH (ref 65–99)
Potassium: 4.2 mmol/L (ref 3.5–5.2)
Sodium: 137 mmol/L (ref 134–144)

## 2019-07-28 LAB — CBC WITH DIFFERENTIAL/PLATELET
Basophils Absolute: 0 10*3/uL (ref 0.0–0.2)
Basos: 0 %
EOS (ABSOLUTE): 0.2 10*3/uL (ref 0.0–0.4)
Eos: 3 %
Hematocrit: 40.8 % (ref 37.5–51.0)
Hemoglobin: 13.5 g/dL (ref 13.0–17.7)
Lymphocytes Absolute: 1.5 10*3/uL (ref 0.7–3.1)
Lymphs: 25 %
MCH: 28.3 pg (ref 26.6–33.0)
MCHC: 33.1 g/dL (ref 31.5–35.7)
MCV: 86 fL (ref 79–97)
Monocytes Absolute: 0.6 10*3/uL (ref 0.1–0.9)
Monocytes: 11 %
Neutrophils Absolute: 3.7 10*3/uL (ref 1.4–7.0)
Neutrophils: 61 %
Platelets: 208 10*3/uL (ref 150–450)
RBC: 4.77 x10E6/uL (ref 4.14–5.80)
RDW: 14.9 % (ref 11.6–15.4)
WBC: 6 10*3/uL (ref 3.4–10.8)

## 2019-07-28 LAB — SARS CORONAVIRUS 2 (TAT 6-24 HRS): SARS Coronavirus 2: NEGATIVE

## 2019-07-29 ENCOUNTER — Encounter (HOSPITAL_COMMUNITY)
Admission: RE | Disposition: A | Discharge status: Home / Self Care | Payer: Self-pay | Source: Home / Self Care | Attending: Cardiovascular Disease

## 2019-07-29 ENCOUNTER — Other Ambulatory Visit: Payer: Self-pay

## 2019-07-29 ENCOUNTER — Ambulatory Visit (HOSPITAL_COMMUNITY)
Admission: RE | Admit: 2019-07-29 | Discharge: 2019-07-29 | Disposition: A | Discharge status: Home / Self Care | Payer: Medicare Other | Attending: Cardiovascular Disease | Admitting: Cardiovascular Disease

## 2019-07-29 ENCOUNTER — Encounter (HOSPITAL_COMMUNITY): Payer: Self-pay | Admitting: Cardiovascular Disease

## 2019-07-29 DIAGNOSIS — Z95820 Peripheral vascular angioplasty status with implants and grafts: Secondary | ICD-10-CM

## 2019-07-29 DIAGNOSIS — Z955 Presence of coronary angioplasty implant and graft: Secondary | ICD-10-CM | POA: Diagnosis not present

## 2019-07-29 DIAGNOSIS — Z7982 Long term (current) use of aspirin: Secondary | ICD-10-CM | POA: Insufficient documentation

## 2019-07-29 DIAGNOSIS — E785 Hyperlipidemia, unspecified: Secondary | ICD-10-CM | POA: Insufficient documentation

## 2019-07-29 DIAGNOSIS — Z8249 Family history of ischemic heart disease and other diseases of the circulatory system: Secondary | ICD-10-CM | POA: Diagnosis not present

## 2019-07-29 DIAGNOSIS — Z87891 Personal history of nicotine dependence: Secondary | ICD-10-CM | POA: Diagnosis not present

## 2019-07-29 DIAGNOSIS — I25118 Atherosclerotic heart disease of native coronary artery with other forms of angina pectoris: Secondary | ICD-10-CM | POA: Diagnosis not present

## 2019-07-29 DIAGNOSIS — Z79899 Other long term (current) drug therapy: Secondary | ICD-10-CM | POA: Insufficient documentation

## 2019-07-29 DIAGNOSIS — I10 Essential (primary) hypertension: Secondary | ICD-10-CM | POA: Diagnosis not present

## 2019-07-29 DIAGNOSIS — Z7902 Long term (current) use of antithrombotics/antiplatelets: Secondary | ICD-10-CM | POA: Diagnosis not present

## 2019-07-29 DIAGNOSIS — I2510 Atherosclerotic heart disease of native coronary artery without angina pectoris: Secondary | ICD-10-CM

## 2019-07-29 HISTORY — PX: CORONARY ANGIOGRAPHY: CATH118303

## 2019-07-29 HISTORY — PX: CORONARY STENT INTERVENTION: CATH118234

## 2019-07-29 HISTORY — DX: Essential (primary) hypertension: I10

## 2019-07-29 HISTORY — PX: CORONARY PRESSURE/FFR STUDY: CATH118243

## 2019-07-29 HISTORY — DX: Atherosclerotic heart disease of native coronary artery without angina pectoris: I25.10

## 2019-07-29 LAB — POCT ACTIVATED CLOTTING TIME
Activated Clotting Time: 252 seconds
Activated Clotting Time: 290 seconds
Activated Clotting Time: 257 seconds

## 2019-07-29 SURGERY — INTRAVASCULAR PRESSURE WIRE/FFR STUDY
Anesthesia: LOCAL

## 2019-07-29 MED ORDER — CLOPIDOGREL BISULFATE 75 MG PO TABS: 75.0000 mg | ORAL_TABLET | ORAL | Status: DC

## 2019-07-29 MED ORDER — NITROGLYCERIN 1 MG/10 ML FOR IR/CATH LAB
INTRA_ARTERIAL | Status: DC | PRN
  Administered 2019-07-29: 150 ug via INTRACORONARY
  Administered 2019-07-29: 200 ug via INTRACORONARY

## 2019-07-29 MED ORDER — NITROGLYCERIN 1 MG/10 ML FOR IR/CATH LAB
INTRA_ARTERIAL | Status: AC
  Filled 2019-07-29: qty 10

## 2019-07-29 MED ORDER — IOHEXOL 350 MG/ML SOLN
INTRAVENOUS | Status: DC | PRN
  Administered 2019-07-29: 120 mL

## 2019-07-29 MED ORDER — SODIUM CHLORIDE 0.9 % IV SOLN: 250.0000 mL | INTRAVENOUS | Status: DC | PRN

## 2019-07-29 MED ORDER — HEPARIN (PORCINE) IN NACL 1000-0.9 UT/500ML-% IV SOLN
INTRAVENOUS | Status: AC
  Filled 2019-07-29: qty 1000

## 2019-07-29 MED ORDER — VERAPAMIL HCL 2.5 MG/ML IV SOLN
INTRAVENOUS | Status: AC
  Filled 2019-07-29: qty 2

## 2019-07-29 MED ORDER — ANGIOPLASTY BOOK
Status: AC
  Filled 2019-07-29: qty 1

## 2019-07-29 MED ORDER — VERAPAMIL HCL 2.5 MG/ML IV SOLN
INTRAVENOUS | Status: DC | PRN
  Administered 2019-07-29: 10 mL via INTRA_ARTERIAL

## 2019-07-29 MED ORDER — ACETAMINOPHEN 325 MG PO TABS: 650.0000 mg | ORAL_TABLET | ORAL | Status: DC | PRN

## 2019-07-29 MED ORDER — HYDRALAZINE HCL 20 MG/ML IJ SOLN
10.0000 mg | INTRAMUSCULAR | Status: AC | PRN
  Administered 2019-07-29: 10 mg via INTRAVENOUS
  Filled 2019-07-29: qty 1

## 2019-07-29 MED ORDER — FENTANYL CITRATE (PF) 100 MCG/2ML IJ SOLN
INTRAMUSCULAR | Status: DC | PRN
  Administered 2019-07-29 (×2): 25 ug via INTRAVENOUS

## 2019-07-29 MED ORDER — LIDOCAINE HCL (PF) 1 % IJ SOLN
INTRAMUSCULAR | Status: AC
  Filled 2019-07-29: qty 30

## 2019-07-29 MED ORDER — LABETALOL HCL 5 MG/ML IV SOLN: 10.0000 mg | INTRAVENOUS | Status: AC | PRN

## 2019-07-29 MED ORDER — SODIUM CHLORIDE 0.9% FLUSH: 3.0000 mL | Freq: Two times a day (BID) | INTRAVENOUS | Status: DC

## 2019-07-29 MED ORDER — SODIUM CHLORIDE 0.9% FLUSH: 3.0000 mL | INTRAVENOUS | Status: DC | PRN

## 2019-07-29 MED ORDER — ONDANSETRON HCL 4 MG/2ML IJ SOLN: 4.0000 mg | Freq: Four times a day (QID) | INTRAMUSCULAR | Status: DC | PRN

## 2019-07-29 MED ORDER — HEPARIN SODIUM (PORCINE) 1000 UNIT/ML IJ SOLN
INTRAMUSCULAR | Status: AC
  Filled 2019-07-29: qty 1

## 2019-07-29 MED ORDER — SODIUM CHLORIDE 0.9 % WEIGHT BASED INFUSION: 1.0000 mL/kg/h | INTRAVENOUS | Status: DC

## 2019-07-29 MED ORDER — FENTANYL CITRATE (PF) 100 MCG/2ML IJ SOLN
INTRAMUSCULAR | Status: AC
  Filled 2019-07-29: qty 2

## 2019-07-29 MED ORDER — MIDAZOLAM HCL 2 MG/2ML IJ SOLN
INTRAMUSCULAR | Status: AC
  Filled 2019-07-29: qty 2

## 2019-07-29 MED ORDER — HEPARIN SODIUM (PORCINE) 1000 UNIT/ML IJ SOLN
INTRAMUSCULAR | Status: DC | PRN
  Administered 2019-07-29: 3000 [IU] via INTRAVENOUS
  Administered 2019-07-29: 9000 [IU] via INTRAVENOUS

## 2019-07-29 MED ORDER — MIDAZOLAM HCL 2 MG/2ML IJ SOLN
INTRAMUSCULAR | Status: DC | PRN
  Administered 2019-07-29 (×2): 1 mg via INTRAVENOUS

## 2019-07-29 MED ORDER — ASPIRIN 81 MG PO CHEW: 81.0000 mg | CHEWABLE_TABLET | ORAL | Status: DC

## 2019-07-29 MED ORDER — LIDOCAINE HCL (PF) 1 % IJ SOLN
INTRAMUSCULAR | Status: DC | PRN
  Administered 2019-07-29: 2 mL

## 2019-07-29 MED ORDER — SODIUM CHLORIDE 0.9 % WEIGHT BASED INFUSION
3.0000 mL/kg/h | INTRAVENOUS | Status: AC
  Administered 2019-07-29: 3 mL/kg/h via INTRAVENOUS

## 2019-07-29 MED ORDER — HEPARIN (PORCINE) IN NACL 1000-0.9 UT/500ML-% IV SOLN
INTRAVENOUS | Status: DC | PRN
  Administered 2019-07-29 (×2): 500 mL

## 2019-07-29 SURGICAL SUPPLY — 23 items
BALLN SAPPHIRE 2.5X15 (BALLOONS) ×2
BALLN SAPPHIRE ~~LOC~~ 2.5X8 (BALLOONS) ×1 IMPLANT
BALLN SAPPHIRE ~~LOC~~ 3.5X15 (BALLOONS) ×1 IMPLANT
BALLN WOLVERINE 2.50X10 (BALLOONS) ×2
BALLOON SAPPHIRE 2.5X15 (BALLOONS) IMPLANT
BALLOON WOLVERINE 2.50X10 (BALLOONS) IMPLANT
CATH 5FR JL3.5 JR4 ANG PIG MP (CATHETERS) ×1 IMPLANT
CATH LAUNCHER 5F EBU3.5 (CATHETERS) ×1 IMPLANT
CATH LAUNCHER 6FR AL.75 (CATHETERS) ×1 IMPLANT
DEVICE RAD COMP TR BAND LRG (VASCULAR PRODUCTS) ×1 IMPLANT
GLIDESHEATH SLEND SS 6F .021 (SHEATH) ×1 IMPLANT
GUIDEWIRE INQWIRE 1.5J.035X260 (WIRE) IMPLANT
GUIDEWIRE PRESSURE COMET II (WIRE) ×1 IMPLANT
INQWIRE 1.5J .035X260CM (WIRE) ×2
KIT ENCORE 26 ADVANTAGE (KITS) ×1 IMPLANT
KIT HEART LEFT (KITS) ×2 IMPLANT
PACK CARDIAC CATHETERIZATION (CUSTOM PROCEDURE TRAY) ×2 IMPLANT
SHEATH PROBE COVER 6X72 (BAG) ×1 IMPLANT
STENT RESOLUTE ONYX 3.0X30 (Permanent Stent) ×1 IMPLANT
TRANSDUCER W/STOPCOCK (MISCELLANEOUS) ×2 IMPLANT
TUBING CIL FLEX 10 FLL-RA (TUBING) ×2 IMPLANT
WIRE COUGAR XT STRL 190CM (WIRE) ×1 IMPLANT
WIRE HI TORQ VERSACORE-J 145CM (WIRE) ×1 IMPLANT

## 2019-07-29 NOTE — Discharge Summary (Signed)
Discharge Summary    Patient ID: Corey Cervantes MRN: [id number]; DOB: Mar 07, 1940  Admit date: 07/29/2019 Discharge date: 07/29/2019  Primary Care Provider: Wendie Agreste, MD  Primary Cardiologist: Sherren Mocha, MD  Primary Electrophysiologist:  None   Discharge Diagnoses    Principal Problem:   Coronary artery disease with exertional angina [facility]) Active Problems:   HTN (hypertension)   Hyperlipidemia LDL goal <70    Diagnostic Studies/Procedures    Left heart cath 07/29/19: 1.  Hemodynamically significant severe stenosis of the mid LAD, treated successfully with PCI using a 3.0 x 30 mm resolute Onyx DES 2.  Moderate nonobstructive stenosis of the left circumflex with negative CT-FFR on precatheterization imaging 3.  Nonobstructive stenosis of the RCA, poorly visualized by invasive angiography secondary to inability to selectively engage the vessel in the setting of severe right subclavian tortuosity.  Recommend: Same-day PCI protocol as long as no early complications arise.  Dual antiplatelet therapy with aspirin and clopidogrel for 6 months without interruption.  Aggressive risk modification.  Consider referral to lipid clinic for PCSK9 inhibitor therapy. _____________   History of Present Illness     Corey Cervantes is a 79 y.o. male with multiple cardiovascular risk factors including hypertension, mixed hyperlipidemia with inability to tolerate statin drugs, and family history of CAD, presenting today for elective cardiac catheterization and possible PCI after undergoing a gated coronary CTA demonstrating severe stenosis in the LAD.  Corey Cervantes is a 79 year old male with multiple CV risk factors as outlined above, who has experienced chest discomfort in the center of his chest as well as shortness of breath.  He had a gated coronary CTA demonstrating severe plaquing in the mid LAD.  This was sent for CT-FFR analysis which was positive for ischemia in the LAD territory with a  FFR of 0.73 just beyond the lesion in the mid vessel.  The patient otherwise had nonobstructive stenosis in the right coronary artery, left main, and left circumflex.  He has been loaded with clopidogrel and presents today for cardiac catheterization and possible PCI.  He reports no change in symptoms.  He has no resting chest discomfort.  Hospital Course     Consultants: none  CAD s/p DES to LAD Angiography revealed hemodynamically significant severe stenosis of the mid LAD treated with DES 3.0 x 30 mm. Severe right subclavian tortuosity precluded adequate visualization of the RCA. Moderate nonobstructive CAD in left Cx with negative FFR on CTA. Discharged on ASA and plavix for at least 6 months. He tolerated the procedure well. Right radial cath site C/D/I.    Hyperlipidemia with LDL goal < 70 03/11/2019: Cholesterol, Total 232; HDL 53; LDL Chol Calc (NIH) 156; Triglycerides 126 Previously intolerant to statins. Needs lipid clinic referral for consideration of PCSK9i.   Hypertension Continue HCTZ.     Did the patient have an acute coronary syndrome (MI, NSTEMI, STEMI, etc) this admission?:  No                               Did the patient have a percutaneous coronary intervention (stent / angioplasty)?:  Yes.     Cath/PCI Registry Performance & Quality Measures: 1. Aspirin prescribed? - Yes 2. ADP Receptor Inhibitor (Plavix/Clopidogrel, Brilinta/Ticagrelor or Effient/Prasugrel) prescribed (includes medically managed patients)? - Yes 3. High Intensity Statin (Lipitor 40-80mg  or Crestor 20-40mg ) prescribed? - No - intolerant 4. For EF <40%, was ACEI/ARB prescribed? - Not Applicable (EF >/=  40%) 5. For EF <40%, Aldosterone Antagonist (Spironolactone or Eplerenone) prescribed? - Not Applicable (EF >/= [id number]) 6. Cardiac Rehab Phase II ordered? - Yes   _____________  Discharge Vitals Blood pressure (!) 161/78, pulse (!) 52, temperature 98 F (36.7 C), temperature source Skin, resp. rate  12, height 5\' 11"  (1.803 m), weight 89.8 kg, SpO2 98 %.  Filed Weights   07/29/19 0822 07/29/19 0903  Weight: 89.8 kg 89.8 kg    Labs & Radiologic Studies    CBC Recent Labs    07/28/19 0836  WBC 6.0  NEUTROABS 3.7  HGB 13.5  HCT 40.8  MCV 86  PLT 123XX123   Basic Metabolic Panel Recent Labs    07/28/19 0836  NA 137  K 4.2  CL 102  CO2 29  GLUCOSE 118*  BUN 22  CREATININE 1.31*  CALCIUM 9.4   Liver Function Tests No results for input(s): AST, ALT, ALKPHOS, BILITOT, PROT, ALBUMIN in the last 72 hours. No results for input(s): LIPASE, AMYLASE in the last 72 hours. High Sensitivity Troponin:   No results for input(s): TROPONINIHS in the last 720 hours.  BNP Invalid input(s): POCBNP D-Dimer No results for input(s): DDIMER in the last 72 hours. Hemoglobin A1C No results for input(s): HGBA1C in the last 72 hours. Fasting Lipid Panel No results for input(s): CHOL, HDL, LDLCALC, TRIG, CHOLHDL, LDLDIRECT in the last 72 hours. Thyroid Function Tests No results for input(s): TSH, T4TOTAL, T3FREE, THYROIDAB in the last 72 hours.  Invalid input(s): FREET3 _____________  CARDIAC CATHETERIZATION  Result Date: 07/29/2019 1.  Hemodynamically significant severe stenosis of the mid LAD, treated successfully with PCI using a 3.0 x 30 mm resolute Onyx DES 2.  Moderate nonobstructive stenosis of the left circumflex with negative CT-FFR on precatheterization imaging 3.  Nonobstructive stenosis of the RCA, poorly visualized by invasive angiography secondary to inability to selectively engage the vessel in the setting of severe right subclavian tortuosity. Recommend: Same-day PCI protocol as long as no early complications arise.  Dual antiplatelet therapy with aspirin and clopidogrel for 6 months without interruption.  Aggressive risk modification.  Consider referral to lipid clinic for PCSK9 inhibitor therapy.  CT CORONARY MORPH W/CTA COR W/SCORE W/CA W/CM &/OR WO/CM  Addendum Date:  07/08/2019   ADDENDUM REPORT: 07/08/2019 17:54 HISTORY: Chest pain, nonspecific chest pain EXAM: Cardiac/Coronary CT TECHNIQUE: The patient was scanned on a [employer]. PROTOCOL: A 120 kV prospective scan was triggered in the descending thoracic aorta at 111 HU's. Axial non-contrast 3 mm slices were carried out through the heart. The data set was analyzed on a dedicated work station and scored using the Jony. Gantry rotation speed was 250 msecs and collimation was 0.6 mm. Beta blockade and 0.8 mg of sl NTG was given. The 3D data set was reconstructed in 5% intervals of 35-75% of the R-R cycle. Diastolic phases were analyzed on a dedicated work station using MPR, MIP and VRT modes. The patient received 163mL OMNIPAQUE IOHEXOL 350 MG/ML SOLN of contrast. FINDINGS: Coronary calcium score: The patient's coronary artery calcium score is 1149, which places the patient in the 78 percentile. Coronary arteries: Normal coronary origins.  Right dominance. Right Coronary Artery: Normal caliber vessel, gives rise to PDA. Scattered calcified plaque. Mid RCA appears to have 25-49% stenosis, otherwise <25% stenosis. Left Main Coronary Artery: Normal caliber vessel. No significant plaque or stenosis. Left Anterior Descending Coronary Artery: Normal caliber vessel. Scattered calcified plaque with minimal stenosis throughout most of the LAD.  However, there is a focal mixed calcified and noncalcified plaque in the proximal LAD that is concerning for high grade stenosis. There is a calcified plaque in the mid LAD that appears to be nearly complete stenosis. LAD gives rise to 2 small diagonal branches. Distal LAD wraps apex. Left Circumflex Artery: Normal caliber vessel. There is a focal calcified plaque near the origin of the LCX that appears to have >70% stenosis. Gives rise to 1 OM branch. Aorta: Normal size, 36 mm at the mid ascending aorta (level of the PA bifurcation) measured double oblique. Scattered  calcifications. No dissection. Aortic Valve: No calcifications. Trileaflet. Other findings: Normal pulmonary vein drainage into the left atrium. Normal left atrial appendage without a thrombus. There is a decreasing gradient of contrast in the appendage without thrombus, suggesting low flow. Normal size of the pulmonary artery. IMPRESSION: 1. Moderate CAD, CADRADS = 3. Areas of potentially severe stenosis include proximal-mid LAD and proximal LCX. CT FFR will be performed and reported separately. 2. Coronary calcium score of 1149. This was 78th percentile for age and sex matched control. 3. Normal coronary origin with right dominance. Electronically Signed   By: Buford Dresser M.D.   On: 07/08/2019 17:54   Result Date: 07/08/2019 EXAM: OVER-READ INTERPRETATION  CT CHEST The following report is an over-read performed by radiologist Dr. Vinnie Langton of [facility], [place] on 07/08/2019. This over-read does not include interpretation of cardiac or coronary anatomy or pathology. The coronary calcium score/coronary CTA interpretation by the cardiologist is attached. COMPARISON:  None. FINDINGS: Aortic atherosclerosis. Multiple small pulmonary nodules measuring 5 mm or less in size, largest of which is in the anterior aspect of the right middle lobe (axial image 27 of series 12) where there is a 5 mm subpleural pulmonary nodule. Within the visualized portions of the thorax there are no other larger more suspicious appearing pulmonary nodules or masses, there is no acute consolidative airspace disease, no pleural effusions, no pneumothorax and no lymphadenopathy. Visualized portions of the upper abdomen are unremarkable. There are no aggressive appearing lytic or blastic lesions noted in the visualized portions of the skeleton. IMPRESSION: 1. Multiple small pulmonary nodules measuring 5 mm or less in size, nonspecific, but statistically likely benign. No follow-up needed if patient is low-risk (and has no  known or suspected primary neoplasm). Non-contrast chest CT can be considered in 12 months if patient is high-risk. This recommendation follows the consensus statement: Guidelines for Management of Incidental Pulmonary Nodules Detected on CT Images: From the Fleischner Society 2017; Radiology 2017; 284:228-243. 2. Aortic Atherosclerosis (ICD10-I70.0). Electronically Signed: By: Vinnie Langton M.D. On: 07/08/2019 12:35   CT CORONARY FRACTIONAL FLOW RESERVE DATA PREP  Result Date: 07/13/2019 EXAM: CT FFR ANALYSIS CLINICAL DATA:  Chest pain, nonspecific chest pain FINDINGS: FFRct analysis was performed on the original cardiac CT angiogram dataset. Diagrammatic representation of the FFRct analysis is provided in a separate PDF document in PACS. This dictation was created using the PDF document and an interactive 3D model of the results. 3D model is not available in the EMR/PACS. Normal FFR range is >0.80. 1. Left Main:  No significant stenosis. FFR = 0.99 2. LAD: There is significant stenosis mid LAD. Proximal FFR = 0.99, Mid FFR = 0.71, Distal FFR = 0.69. 3. LCX: No significant stenosis. Proximal FFR = 0.94, Distal FFR = 0.91 4. RCA: No significant stenosis. Proximal FFR = 0.99, Mid FFR = 0.97, Distal FFR = 0.93. IMPRESSION: 1. CT FFR analysis show significant focal  stenosis in the mid LAD region with FFR = 0.71. Electronically Signed   By: Buford Dresser M.D.   On: 07/13/2019 12:04   Disposition   Pt is being discharged home today in good condition.  Follow-up Plans & Appointments    Follow-up Information    [facility], Crista Luria, [place] Follow up on 08/06/2019.   Specialty: Cardiology Why: 8:45 am for follow up appt Contact information: [place] [address] [phone]          Discharge Instructions    Amb Referral to Cardiac Rehabilitation   Complete by: As directed    Diagnosis: Coronary Stents   After initial evaluation and assessments completed: Virtual  Based Care may be provided alone or in conjunction with Phase 2 Cardiac Rehab based on patient barriers.: Yes      Discharge Medications   Allergies as of 07/29/2019      Reactions   Codeine Other (See Comments)   NIGHTMARES   Lipitor [atorvastatin] Other (See Comments)   Malaise, myalgia.    Pravastatin Other (See Comments)   Myalgia, malaise.       Medication List    TAKE these medications   ALFALFA PO Take 300 mg by mouth daily.   ASHWAGANDHA PO Take 800 mg by mouth daily.   aspirin EC 81 MG tablet Take 1 tablet (81 mg total) by mouth daily.   b complex vitamins tablet Take 1 tablet by mouth daily.   buPROPion 100 MG tablet Commonly known as: WELLBUTRIN Take 100 mg by mouth daily.   CHONDROITIN SULFATE PO Take 2 tablets by mouth daily.   clopidogrel 75 MG tablet Commonly known as: PLAVIX Take 1 tablet (75 mg total) by mouth daily.   co-enzyme Q-10 30 MG capsule Take 30 mg by mouth daily.   Ginkgo Biloba Extract 60 MG Caps Take 60 mg by mouth daily.   GOLDEN SEAL PO Take 550 mg by mouth daily.   HAWTHORN PO Take 500 mg by mouth daily.   hydrochlorothiazide 12.5 MG capsule Commonly known as: MICROZIDE TAKE 1 CAPSULE BY MOUTH EVERY DAY What changed:   how much to take  how to take this  when to take this  additional instructions   KOREAN GINSENG PO Take 300 mg by mouth daily.   KRILL OIL PO Take 500 mg by mouth daily.   metoprolol succinate 25 MG 24 hr tablet Commonly known as: TOPROL-XL Take 1 tablet (25 mg total) by mouth daily. Take with or immediately following a meal.   nitroGLYCERIN 0.4 MG SL tablet Commonly known as: NITROSTAT Place 1 tablet (0.4 mg total) under the tongue every 5 (five) minutes as needed for chest pain.   SAW PALMETTO PO Take 1 Dose by mouth 2 (two) times a week.   Visine 0.025-0.3 % ophthalmic solution Generic drug: naphazoline-pheniramine Place 1 drop into both eyes 4 (four) times daily as needed for eye  irritation. Visine   VITAMIN D-3 PO Take 1,000 mg by mouth daily.   vitamin E 180 MG (400 UNITS) capsule Take 400 Units by mouth daily.          Outstanding Labs/Studies   Lipid clinic referral  Duration of Discharge Encounter   Greater than 30 minutes including physician time.  Signed, [place], PA 07/29/2019, 2:25 PM

## 2019-07-29 NOTE — Progress Notes (Signed)
Hematoma noted pressure held to right wrist area. Soyla Murphy called and in to assess TRB. TRB was repositioned by Soyla Murphy and 14cc of air added to band  Area soft at this time.

## 2019-07-29 NOTE — Progress Notes (Signed)
Bleeding noted right radial site and added 2cc to tr band; no further bleeding noted and no hematoma at present

## 2019-07-29 NOTE — H&P (Signed)
Cardiology Admission History and Physical:   Patient ID: Corey Cervantes MRN: [id number]; DOB: 09/03/1940   Admission date: 07/29/2019  Primary Care Provider: Wendie Agreste, MD Primary Cardiologist: Sherren Mocha, MD  Primary Electrophysiologist:  None   Chief Complaint:  Abnormal cardiac CTA  Patient Profile:   Corey Cervantes is a 79 y.o. male with multiple cardiovascular risk factors including hypertension, mixed hyperlipidemia with inability to tolerate statin drugs, and family history of CAD, presenting today for elective cardiac catheterization and possible PCI after undergoing a gated coronary CTA demonstrating severe stenosis in the LAD  History of Present Illness:   Corey Cervantes is a 79 year old male with multiple CV risk factors as outlined above, who has experienced chest discomfort in the center of his chest as well as shortness of breath.  He had a gated coronary CTA demonstrating severe plaquing in the mid LAD.  This was sent for CT-FFR analysis which was positive for ischemia in the LAD territory with a FFR of 0.73 just beyond the lesion in the mid vessel.  The patient otherwise had nonobstructive stenosis in the right coronary artery, left main, and left circumflex.  He has been loaded with clopidogrel and presents today for cardiac catheterization and possible PCI.  He reports no change in symptoms.  He has no resting chest discomfort.   Past Medical History:  Diagnosis Date  . Allergy   . Arthritis   . Cancer [facility]) many years ago    melanoma  . Hx of adenomatous colonic polyps 08/30/2016  . Hyperlipidemia     Past Surgical History:  Procedure Laterality Date  . APPENDECTOMY    . COLON SURGERY    . HERNIA REPAIR    . MELANOMA EXCISION  1984     Medications Prior to Admission: Prior to Admission medications   Medication Sig Start Date End Date Taking? Authorizing Provider  ALFALFA PO Take 300 mg by mouth daily.    Yes [provider]  ASHWAGANDHA PO  Take 800 mg by mouth daily.   Yes [provider]  aspirin EC 81 MG tablet Take 1 tablet (81 mg total) by mouth daily. 05/07/13  Yes Sherren Mocha, MD  b complex vitamins tablet Take 1 tablet by mouth daily.   Yes [provider]  buPROPion (WELLBUTRIN) 100 MG tablet Take 100 mg by mouth daily.   Yes [provider]  Cholecalciferol (VITAMIN D-3 PO) Take 1,000 mg by mouth daily.   Yes [provider]  CHONDROITIN SULFATE PO Take 2 tablets by mouth daily.    Yes [provider]  clopidogrel (PLAVIX) 75 MG tablet Take 1 tablet (75 mg total) by mouth daily. 07/13/19 07/07/20 Yes Sherren Mocha, MD  co-enzyme Q-10 30 MG capsule Take 30 mg by mouth daily.   Yes [provider]  Ginkgo Biloba Extract 60 MG CAPS Take 60 mg by mouth daily.    Yes [provider]  GOLDEN SEAL PO Take 550 mg by mouth daily.   Yes [provider]  HAWTHORN PO Take 500 mg by mouth daily.    Yes [provider]  hydrochlorothiazide (MICROZIDE) 12.5 MG capsule TAKE 1 CAPSULE BY MOUTH EVERY DAY Patient taking differently: Take 12.5 mg by mouth daily.  03/11/19  Yes Wendie Agreste, MD  KOREAN GINSENG PO Take 300 mg by mouth daily.   Yes [provider]  KRILL OIL PO Take 500 mg by mouth daily.   Yes [provider]  metoprolol succinate (TOPROL-XL) 25 MG 24 hr tablet Take 1 tablet (25 mg total) by mouth daily. Take with or immediately following a meal. 07/13/19 07/07/20 Yes Sherren Mocha, MD  naphazoline-pheniramine (VISINE) 0.025-0.3 % ophthalmic solution Place 1 drop into both eyes 4 (four) times daily as needed for eye irritation. Visine   Yes [provider]  Saw Palmetto, Serenoa repens, (SAW PALMETTO PO) Take 1 Dose by mouth 2 (two) times a week.    Yes [provider]  vitamin E 180 MG (400 UNITS) capsule Take 400 Units by mouth daily.   Yes [provider]  nitroGLYCERIN (NITROSTAT) 0.4 MG SL  tablet Place 1 tablet (0.4 mg total) under the tongue every 5 (five) minutes as needed for chest pain. 07/13/19   Sherren Mocha, MD  buPROPion [facility] SR) 100 MG 12 hr tablet Take 1 tablet (100 mg total) by mouth 2 (two) times daily. Start with 1 tab QD for first week or two, then can increase to BID if needed. 09/01/18   Wendie Agreste, MD     Allergies:    Allergies  Allergen Reactions  . Codeine Other (See Comments)    NIGHTMARES  . Lipitor [Atorvastatin] Other (See Comments)    Malaise, myalgia.   . Pravastatin Other (See Comments)    Myalgia, malaise.     Social History:   Social History   Socioeconomic History  . Marital status: Married    Spouse name: Not on file  . Number of children: Not on file  . Years of education: Not on file  . Highest education level: Not on file  Occupational History  . Occupation: Art gallery manager  Tobacco Use  . Smoking status: Former Smoker    Years: 15.00    Types: Cigarettes, Pipe, Cigars    Quit date: 02/26/1981    Years since quitting: 38.4  . Smokeless tobacco: Never Used  Substance and Sexual Activity  . Alcohol use: No    Comment: no use at this time per pt  . Drug use: No  . Sexual activity: Yes  Other Topics Concern  . Not on file  Social History Narrative  . Not on file   Social Determinants of Health   Financial Resource Strain:   . Difficulty of Paying Living Expenses:   Food Insecurity:   . Worried About Charity fundraiser in the Last Year:   . Arboriculturist in the Last Year:   Transportation Needs:   . Film/video editor (Medical):   Marland Kitchen Lack of Transportation (Non-Medical):   Physical Activity:   . Days of Exercise per Week:   . Minutes of Exercise per Session:   Stress:   . Feeling of Stress :   Social Connections:   . Frequency of Communication with Friends and Family:   . Frequency of Social Gatherings with Friends and Family:   . Attends Religious Services:   . Active Member of Clubs or  Organizations:   . Attends Archivist Meetings:   Marland Kitchen Marital Status:   Intimate Partner Violence:   . Fear of Current or Ex-Partner:   . Emotionally Abused:   Marland Kitchen Physically Abused:   . Sexually Abused:     Family History:   The patient's family history includes Dementia in his mother; Heart disease in his father; Multiple sclerosis in his sister. There is no history of Colon cancer.    ROS:  Please see the history of present illness.  All  other ROS reviewed and negative.     Physical Exam/Data:   Vitals:   07/29/19 0822 07/29/19 0903  BP: (!) 153/88   Pulse: 60   Resp: 17   Temp: 98 F (36.7 C)   TempSrc: Skin   SpO2: 96%   Weight: 89.8 kg 89.8 kg  Height: 5\' 11"  (1.803 m)    No intake or output data in the 24 hours ending 07/29/19 0933 Last 3 Weights 07/29/2019 07/29/2019 06/15/2019  Weight (lbs) 197 lb 15.6 oz 198 lb 201 lb  Weight (kg) 89.8 kg 89.812 kg 91.173 kg     Body mass index is 27.61 kg/m.  General:  Well nourished, well developed, in no acute distress HEENT: normal Lymph: no adenopathy Neck: no JVD Endocrine:  No thryomegaly Vascular: No carotid bruits;  Cardiac:  normal S1, S2; RRR; no murmur  Lungs:  clear to auscultation bilaterally, no wheezing, rhonchi or rales  Abd: soft, nontender, no hepatomegaly  Ext: no edema Musculoskeletal:  No deformities, BUE and BLE strength normal and equal Skin: warm and dry  Neuro:  CNs 2-12 intact, no focal abnormalities noted Psych:  Normal affect   Relevant CV Studies: Gated cardiac CTA: FINDINGS: Coronary calcium score: The patient's coronary artery calcium score is 1149, which places the patient in the 78 percentile.  Coronary arteries: Normal coronary origins.  Right dominance.  Right Coronary Artery: Normal caliber vessel, gives rise to PDA. Scattered calcified plaque. Mid RCA appears to have 25-49% stenosis, otherwise <25% stenosis.  Left Main Coronary Artery: Normal caliber vessel. No  significant plaque or stenosis.  Left Anterior Descending Coronary Artery: Normal caliber vessel. Scattered calcified plaque with minimal stenosis throughout most of the LAD. However, there is a focal mixed calcified and noncalcified plaque in the proximal LAD that is concerning for high grade stenosis. There is a calcified plaque in the mid LAD that appears to be nearly complete stenosis. LAD gives rise to 2 small diagonal branches. Distal LAD wraps apex.  Left Circumflex Artery: Normal caliber vessel. There is a focal calcified plaque near the origin of the LCX that appears to have >70% stenosis. Gives rise to 1 OM branch.  Aorta: Normal size, 36 mm at the mid ascending aorta (level of the PA bifurcation) measured double oblique. Scattered calcifications. No dissection.  Aortic Valve: No calcifications. Trileaflet.  Other findings:  Normal pulmonary vein drainage into the left atrium.  Normal left atrial appendage without a thrombus. There is a decreasing gradient of contrast in the appendage without thrombus, suggesting low flow.  Normal size of the pulmonary artery.  IMPRESSION: 1. Moderate CAD, CADRADS = 3. Areas of potentially severe stenosis include proximal-mid LAD and proximal LCX. CT FFR will be performed and reported separately.  2. Coronary calcium score of 1149. This was 78th percentile for age and sex matched control.  3. Normal coronary origin with right dominance.  1. Left Main:  No significant stenosis. FFR = 0.99  2. LAD: There is significant stenosis mid LAD. Proximal FFR = 0.99, Mid FFR = 0.71, Distal FFR = 0.69. 3. LCX: No significant stenosis. Proximal FFR = 0.94, Distal FFR = 0.91 4. RCA: No significant stenosis. Proximal FFR = 0.99, Mid FFR = 0.97, Distal FFR = 0.93.  IMPRESSION: 1. CT FFR analysis show significant focal stenosis in the mid LAD region with FFR = 0.71.   Laboratory Data:  High Sensitivity Troponin:  No  results for input(s): TROPONINIHS in the last 720 hours.  Chemistry Recent Labs  Lab 07/28/19 0836  NA 137  K 4.2  CL 102  CO2 29  GLUCOSE 118*  BUN 22  CREATININE 1.31*  CALCIUM 9.4  GFRNONAA 52*  GFRAA 60    No results for input(s): PROT, ALBUMIN, AST, ALT, ALKPHOS, BILITOT in the last 168 hours. Hematology Recent Labs  Lab 07/28/19 0836  WBC 6.0  RBC 4.77  HGB 13.5  HCT 40.8  MCV 86  MCH 28.3  MCHC 33.1  RDW 14.9  PLT 208   BNPNo results for input(s): BNP, PROBNP in the last 168 hours.  DDimer No results for input(s): DDIMER in the last 168 hours.   Radiology/Studies:  No results found.      Assessment and Plan:   1. CAD with angina: Patient with hemodynamically significant stenosis in the proximal to mid LAD with CT-FFR of 0.71.  He has been preloaded with aspirin and clopidogrel.  Symptoms are CCS class III.  Patient treated with single drug antianginal therapy with metoprolol.  Risks, indications, and alternatives of cardiac catheterization and possible PCI reviewed with the patient who agrees to proceed.    For questions or updates, please contact [place] Please consult [email or web address] for contact info under        Signed, Sherren Mocha, MD  07/29/2019 9:33 AM

## 2019-07-29 NOTE — Progress Notes (Signed)
On arrival from cath lab, hematoma noted right radial and pressure held by [facility] and 2cc added to TR band and site soft

## 2019-07-29 NOTE — Progress Notes (Signed)
[id number] Education completed with pt who voiced understanding. Stressed importance of plavix with stent. Reviewed NTG use, walking for ex, gave heart healthy diet, and CRP 2. Referred to [facility] program. Pt has been walking for ex at least 30 mins at a time several days a week and would be good referral for program. Graylon Good RN BSN 07/29/2019 1:50 PM

## 2019-07-29 NOTE — Progress Notes (Signed)
Harriett M,RN in and removed 2cc air from tr band; no increased bleeding or hematoma

## 2019-07-29 NOTE — Progress Notes (Signed)
Wyn Quaker NP in to see client and ok to d/c home

## 2019-07-29 NOTE — Discharge Instructions (Addendum)
Coronary Angiogram With Stent, Care After This sheet gives you information about how to care for yourself after your procedure. Your health care provider may also give you more specific instructions. If you have problems or questions, contact your health care provider. What can I expect after the procedure? After the procedure, it is common to have:  Bruising and tenderness at the insertion site. This usually fades within 1-2 weeks.  A collection of blood under the skin (hematoma). This usually decreases within 1-2 weeks. Follow these instructions at home: Medicines  Take over-the-counter and prescription medicines only as told by your health care provider.  If you were prescribed an antibiotic medicine, take it as told by your health care provider. Do not stop using the antibiotic even if you start to feel better.  If you take medicines for diabetes, your health care provider may need to change how much you take. Ask your health care provider for specific directions about taking your diabetes medicines.  If you are taking blood thinners: ? Talk with your health care provider before you take any medicines that contain aspirin or NSAIDs, such as ibuprofen. These medicines increase your risk for dangerous bleeding. ? Take your medicine exactly as told, at the same time every day. ? Avoid activities that could cause injury or bruising, and follow instructions about how to prevent falls. ? Wear a medical alert bracelet or carry a card that lists what medicines you take. Eating and drinking   Follow instructions from your health care provider about eating or drinking restrictions.  Eat a heart-healthy diet that includes plenty of fresh fruits and vegetables.  Avoid foods that are high in salt, sugar, or saturated fat. Avoid fried foods or canned or highly processed food.  Drink enough fluid to keep your urine pale yellow. Alcohol use  Do not drink alcohol if: ? Your health care provider  tells you not to. ? You are pregnant, may be pregnant, or plan to become pregnant.  If you drink alcohol: ? Limit how much you use to:  0-1 drink a day for women.  0-2 drinks a day for men. ? Be aware of how much alcohol is in your drink. In the [place] one drink equals one 12 oz bottle of beer (355 mL), one 5 oz glass of wine (148 mL), or one 1 oz glass of hard liquor (44 mL). Bathing  Do not take baths, swim, or use a hot tub until your health care provider approves. Ask your health care provider if you may take showers. You may only be allowed to take sponge baths.  Gently wash the insertion site with plain soap and water.  Pat the area dry with a clean towel. Do not rub. This may cause bleeding. Incision care  Follow instructions from your health care provider about how to take care of your insertion area. Make sure you: ? Wash your hands with soap and water before and after you change your bandage (dressing). If soap and water are not available, use hand sanitizer. ? Change your dressing as told by your health care provider. ? Leave stitches (sutures) or adhesive strips in place. These skin closures may need to stay in place for 2 weeks or longer. If adhesive strip edges start to loosen and curl up, you may trim the loose edges. Do not remove adhesive strips completely unless your health care provider tells you to do that.  Do not apply powder or lotion on the insertion area.  Check  your insertion area every day for signs of infection. Check for: ? Redness, swelling, or pain. ? Fluid or blood. ? Warmth. ? Pus or a bad smell. Activity  Do not drive for 24 hours if you were given a sedative during your procedure.  Rest as told by your health care provider. ? Avoid sitting for a long time without moving. Get up to take short walks every 1-2 hours. This is important to improve blood flow and breathing. Ask for help if you feel weak or unsteady.  Do not lift anything that is  heavier than 10 lb (4.5 kg), or the limit that you are told, until your health care provider says that it is safe.  Return to your normal activities as told by your health care provider. Ask your health care provider what activities are safe for you. Lifestyle   Do not use any products that contain nicotine or tobacco, such as cigarettes, e-cigarettes, and chewing tobacco. If you need help quitting, ask your health care provider.  If needed, work with your health care provider to treat other problems, such as being overweight, or having high blood pressure or diabetes.  Get regular exercise. Do exercises as told by your health care provider. General instructions  Tell all your health care providers that you have a stent. This is especially important if you are going to get imaging studies, such as MRI.  Wear compression stockings as told by your health care provider. These stockings help to prevent blood clots and reduce swelling in your legs.  Do not strain during a bowel movement if the procedure was done through your leg. Straining may cause bleeding from the insertion site.  Keep all follow-up visits as directed by your health care provider. This is important. Contact a health care provider if you:  Have a fever.  Have chills.  Have redness, swelling, or pain around your insertion area.  Have fluid or blood (other than a little blood on the dressing) coming from your insertion area.  Notice that your insertion area feels warm to the touch.  Have pus or a bad smell coming from your insertion area.  Have more bleeding from the insertion area. Hold pressure on the area. Get help right away if:  You develop chest pain or shortness of breath.  You feel like fainting or you faint.  Your leg or arm becomes cool, numb, or tingly.  You have unusual pain.  Your insertion area is bleeding, and bleeding continues after 30 minutes of steadily held pressure.  You develop bleeding  anywhere else, including from your rectum. There may be bright red blood in your urine or stool, or you may have black, tarry stool. These symptoms may represent a serious problem that is an emergency. Do not wait to see if the symptoms will go away. Get medical help right away. Call your local emergency services (911 in the [place] Do not drive yourself to the hospital. Summary  After this procedure, it is common to have bruising and tenderness around the catheter insertion site. This will go away in a few weeks.  Follow your health care provider's instructions about caring for your insertion site. Change dressing and clean the area as instructed.  Eat a heart-healthy diet. Limit alcohol use. Do not use tobacco or nicotine.  Contact a health care provider if you have fever or chills, or if you have pus or a bad smell coming from the site.  Get help right away if you   develop chest pain, you faint, or have bleeding at the insertion site. This information is not intended to replace advice given to you by your health care provider. Make sure you discuss any questions you have with your health care provider. Document Revised: 09/03/2018 Document Reviewed: 09/03/2018 Elsevier Patient Education  Person  This sheet gives you information about how to care for yourself after your procedure. Your health care provider may also give you more specific instructions. If you have problems or questions, contact your health care provider. What can I expect after the procedure? After the procedure, it is common to have:  Bruising and tenderness at the catheter insertion area. Follow these instructions at home: Medicines  Take over-the-counter and prescription medicines only as told by your health care provider. Insertion site care  Follow instructions from your health care provider about how to take care of your insertion site. Make sure you: ? Wash your hands with soap and  water before you change your bandage (dressing). If soap and water are not available, use hand sanitizer. ? Change your dressing as told by your health care provider. ? Leave stitches (sutures), skin glue, or adhesive strips in place. These skin closures may need to stay in place for 2 weeks or longer. If adhesive strip edges start to loosen and curl up, you may trim the loose edges. Do not remove adhesive strips completely unless your health care provider tells you to do that.  Check your insertion site every day for signs of infection. Check for: ? Redness, swelling, or pain. ? Fluid or blood. ? Pus or a bad smell. ? Warmth.  Do not take baths, swim, or use a hot tub until your health care provider approves.  You may shower 24-48 hours after the procedure, or as directed by your health care provider. ? Remove the dressing and gently wash the site with plain soap and water. ? Pat the area dry with a clean towel. ? Do not rub the site. That could cause bleeding.  Do not apply powder or lotion to the site. Activity   For 24 hours after the procedure, or as directed by your health care provider: ? Do not flex or bend the affected arm. ? Do not push or pull heavy objects with the affected arm. ? Do not drive yourself home from the hospital or clinic. You may drive 24 hours after the procedure unless your health care provider tells you not to. ? Do not operate machinery or power tools.  Do not lift anything that is heavier than 10 lb (4.5 kg), or the limit that you are told, until your health care provider says that it is safe.  Ask your health care provider when it is okay to: ? Return to work or school. ? Resume usual physical activities or sports. ? Resume sexual activity. General instructions  If the catheter site starts to bleed, raise your arm and put firm pressure on the site. If the bleeding does not stop, get help right away. This is a medical emergency.  If you went home on  the same day as your procedure, a responsible adult should be with you for the first 24 hours after you arrive home.  Keep all follow-up visits as told by your health care provider. This is important. Contact a health care provider if:  You have a fever.  You have redness, swelling, or yellow drainage around your insertion site. Get help right away if:  You have unusual pain at the radial site.  The catheter insertion area swells very fast.  The insertion area is bleeding, and the bleeding does not stop when you hold steady pressure on the area.  Your arm or hand becomes pale, cool, tingly, or numb. These symptoms may represent a serious problem that is an emergency. Do not wait to see if the symptoms will go away. Get medical help right away. Call your local emergency services (911 in the [place] Do not drive yourself to the hospital. Summary  After the procedure, it is common to have bruising and tenderness at the site.  Follow instructions from your health care provider about how to take care of your radial site wound. Check the wound every day for signs of infection.  Do not lift anything that is heavier than 10 lb (4.5 kg), or the limit that you are told, until your health care provider says that it is safe. This information is not intended to replace advice given to you by your health care provider. Make sure you discuss any questions you have with your health care provider. Document Revised: 03/20/2017 Document Reviewed: 03/20/2017 Elsevier Patient Education  2020 [place] about your medication: Plavix (anti-platelet agent)  Generic Name (Brand): clopidogrel (Plavix), once daily medication  PURPOSE: You are taking this medication along with aspirin to lower your chance of having a heart attack, stroke, or blood clots in your heart stent. These can be fatal. Plavix and aspirin help prevent platelets from sticking together and forming a clot that can block an  artery or your stent.   Common SIDE EFFECTS you may experience include: bruising or bleeding more easily, shortness of breath  Do not stop taking PLAVIX without talking to the doctor who prescribes it for you. People who are treated with a stent and stop taking Plavix too soon, have a higher risk of getting a blood clot in the stent, having a heart attack, or dying. If you stop Plavix because of bleeding, or for other reasons, your risk of a heart attack or stroke may increase.   Tell all of your doctors and dentists that you are taking Plavix. They should talk to the doctor who prescribed plavix for you before you have any surgery or invasive procedure.   Contact your health care provider if you experience: severe or uncontrollable bleeding, pink/red/brown urine, vomiting blood or vomit that looks like "coffee grounds", red or black stools (looks like tar), coughing up blood or blood clots ----------------------------------------------------------------------------------------------------------------------  Herbal Supplements: Be sure to continue giving a comprehensive medication list which includes supplements to your healthcare provider at each visit and be sure your healthcare provider is aware of all of the supplements you take before starting a new medication.   Afalfa- avoid in gout and lupus. This supplement should be OK based off of your medication list.    Ashwaghanda- may increase testosterone levels, may have a sedative effect, may cause some drug interactions (may induce CYP3A4 and inhibit CYP2B6). This supplement should be OK to continue taking based off of your current medication list.   Ginkgo Biloba- This supplement has many drug interactions and may also lead to bleeding, especially in combination with Plavix and Baby Aspirin. We recommend that you discontinue this supplement while you are on these medications.   Golden seal- There is not much clinical information available  regarding this supplement and how it interacts with other drugs. Be cautious taking this supplement.    Ginseng- This  supplement may lead to bleeding, especially in combination with Plavix and Baby Aspirin. We recommend that you discontinue this supplement while you are on these medications.  Krill oil- This supplement may lead to bleeding, especially in combination with Plavix and Baby Aspirin. We recommend that you discontinue this supplement while you are on these medications.  Hawthorn- This supplement may lead to drug interactions (CYP3A4 inducer). This supplement may lead to bleeding, especially in combination with Plavix and Baby Aspirin. We recommend that you discontinue this supplement while you are on these medications.   Websites for reference:   https://ods.CoursePreviews.dk TheyParty.dk

## 2019-07-30 ENCOUNTER — Telehealth (HOSPITAL_COMMUNITY): Payer: Self-pay

## 2019-07-30 ENCOUNTER — Encounter (HOSPITAL_COMMUNITY): Payer: Self-pay

## 2019-07-30 NOTE — Telephone Encounter (Signed)
Attempted to call patient in regards to Cardiac Rehab - LM on VM 

## 2019-07-30 NOTE — Telephone Encounter (Signed)
Pt insurance is active and benefits verified through [facility] Medicare. Co-pay $0.00, DED $0.00/$0.00 met, out of pocket $3,600.00/$157.23 met, co-insurance 0%. No pre-authorization required. Passport, 07/30/19 @ 10:28AM, HQN#[id number]  Will contact patient to see if he is interested in the Cardiac Rehab Program. If interested, patient will need to complete follow up appt. Once completed, patient will be contacted for scheduling upon review by the RN Navigator.

## 2019-08-06 ENCOUNTER — Ambulatory Visit: Payer: Self-pay | Admitting: Physician Assistant

## 2019-08-06 NOTE — Telephone Encounter (Signed)
Call pt [facility]

## 2019-08-07 NOTE — Telephone Encounter (Signed)
Call pt [facility] on 08/07/19 @ 9;58

## 2019-08-11 NOTE — Telephone Encounter (Signed)
Called pt for 3rd time  [facility]   cancel appointment 08/11/19 @ 2;49 Pm

## 2019-08-13 ENCOUNTER — Ambulatory Visit: Payer: Medicare Other | Admitting: Family Medicine

## 2019-08-20 ENCOUNTER — Encounter: Payer: Self-pay | Admitting: Family Medicine

## 2019-08-20 ENCOUNTER — Other Ambulatory Visit: Payer: Self-pay

## 2019-08-20 ENCOUNTER — Ambulatory Visit (INDEPENDENT_AMBULATORY_CARE_PROVIDER_SITE_OTHER): Payer: Medicare Other | Admitting: Family Medicine

## 2019-08-20 VITALS — BP 122/79 | HR 65 | Temp 98.1°F | Ht 71.0 in | Wt 196.0 lb

## 2019-08-20 DIAGNOSIS — Z1159 Encounter for screening for other viral diseases: Secondary | ICD-10-CM | POA: Diagnosis not present

## 2019-08-20 DIAGNOSIS — E785 Hyperlipidemia, unspecified: Secondary | ICD-10-CM | POA: Diagnosis not present

## 2019-08-20 DIAGNOSIS — Z9889 Other specified postprocedural states: Secondary | ICD-10-CM | POA: Diagnosis not present

## 2019-08-20 DIAGNOSIS — I10 Essential (primary) hypertension: Secondary | ICD-10-CM

## 2019-08-20 DIAGNOSIS — I2510 Atherosclerotic heart disease of native coronary artery without angina pectoris: Secondary | ICD-10-CM | POA: Diagnosis not present

## 2019-08-20 DIAGNOSIS — R7303 Prediabetes: Secondary | ICD-10-CM | POA: Diagnosis not present

## 2019-08-20 NOTE — Progress Notes (Signed)
Subjective:  Patient ID: Corey Cervantes, male    DOB: 01-Feb-1941  Age: 79 y.o. MRN: [id number]  CC:  Chief Complaint  Patient presents with  . Hospitalization Follow-up    on 07/29/2019 Pt went to the hospital and had a stent put in. Pt reports that he is feeling better since the surgery. Pt states he hasn't had any presure in his chest since the surgery.    HPI Corey Cervantes presents for   Hospital follow-up:  Coronary artery disease with exertional angina Admitted June 2 by cardiology, left heart cath indicating hemodynamically significant severe stenosis of the mid LAD treated with PCI using a 3 x 30 mm drug-eluting stent.  Moderate nonobstructive stenosis of the left circumflex with negative CT-FFR on precath imaging.  Nonobstructive stenosis of the right coronary artery poorly visualized by invasive angiography secondary to inability to selectively engage the vessel in the setting of severe right subclavian tortuosity.  Tolerated procedure well.  Started on dual antiplatelet therapy with aspirin and Plavix for 6 months, plan for aggressive risk factor modification with his hyperlipidemia, hypertension.  Possible referral to lipid clinic for PCSK9 inhibitor as previously intolerant to statins.   51 yo son had MI on [place] - doing ok now.   Feels better than had in past. Some fatigue at times. No chest pain.  No new bleeding.  No need for nitroglycerin.   Hypertension: Continue on hydrochlorothiazide 12.5 mg daily, Toprol XL 25 mg daily. No new side effects.  Home readings: 128-130, then 118-120 after meds.  BP Readings from Last 3 Encounters:  08/20/19 122/79  07/29/19 (!) 155/80  07/08/19 121/76   Lab Results  Component Value Date   CREATININE 1.31 (H) 07/28/2019   Hyperlipidemia: Intolerant to statins, plan for lipid clinic evaluation. Not sure that has been scheduled.   Lab Results  Component Value Date   CHOL 232 (H) 03/11/2019   HDL 53 03/11/2019    LDLCALC 156 (H) 03/11/2019   TRIG 126 03/11/2019   CHOLHDL 4.4 03/11/2019   Lab Results  Component Value Date   ALT 14 03/11/2019   AST 21 03/11/2019   ALKPHOS 58 03/11/2019   BILITOT 0.6 03/11/2019   PreDiabetes: No current meds, A1c was improving as of January. Has adjusted diet further since cardiac procedure.   Wt Readings from Last 3 Encounters:  08/20/19 196 lb (88.9 kg)  07/29/19 197 lb 15.6 oz (89.8 kg)  06/15/19 201 lb (91.2 kg)    Lab Results  Component Value Date   HGBA1C 6.1 (H) 03/11/2019   HGBA1C 6.2 (H) 09/01/2018   HGBA1C 6.4 (H) 03/07/2018     History Patient Active Problem List   Diagnosis Date Noted  . Coronary artery disease with exertional angina ([place]) 07/29/2019  . Hx of adenomatous colonic polyps 08/30/2016  . HTN (hypertension) 05/25/2013  . Hyperlipidemia LDL goal <70 05/25/2013  . Hyperglycemia 05/25/2013   Past Medical History:  Diagnosis Date  . Allergy   . Arthritis   . CAD (coronary artery disease) 07/29/2019   DES to LAD  . Cancer [facility]) many years ago    melanoma  . Hx of adenomatous colonic polyps 08/30/2016  . Hyperlipidemia   . Hypertension    Past Surgical History:  Procedure Laterality Date  . APPENDECTOMY    . COLON SURGERY    . CORONARY ANGIOGRAPHY N/A 07/29/2019   Procedure: CORONARY ANGIOGRAPHY;  Surgeon: Sherren Mocha, MD;  Location: [place] [facility];  Service: Cardiovascular;  Laterality: N/A;  . CORONARY STENT INTERVENTION N/A 07/29/2019   Procedure: CORONARY STENT INTERVENTION;  Surgeon: Sherren Mocha, MD;  Location: [place] [facility];  Service: Cardiovascular;  Laterality: N/A;  . HERNIA REPAIR    . INTRAVASCULAR PRESSURE WIRE/FFR STUDY N/A 07/29/2019   Procedure: INTRAVASCULAR PRESSURE WIRE/FFR STUDY;  Surgeon: Sherren Mocha, MD;  Location: [place] [facility];  Service: Cardiovascular;  Laterality: N/A;  . MELANOMA EXCISION  1984   Allergies  Allergen Reactions  . Codeine Other (See Comments)     NIGHTMARES  . Lipitor [Atorvastatin] Other (See Comments)    Malaise, myalgia.   . Pravastatin Other (See Comments)    Myalgia, malaise.    Prior to Admission medications   Medication Sig Start Date End Date Taking? Authorizing Provider  ALFALFA PO Take 300 mg by mouth daily.    Yes [provider]  ASHWAGANDHA PO Take 800 mg by mouth daily.   Yes [provider]  aspirin EC 81 MG tablet Take 1 tablet (81 mg total) by mouth daily. 05/07/13  Yes Sherren Mocha, MD  b complex vitamins tablet Take 1 tablet by mouth daily.   Yes [provider]  buPROPion (WELLBUTRIN) 100 MG tablet Take 100 mg by mouth daily.   Yes [provider]  Cholecalciferol (VITAMIN D-3 PO) Take 1,000 mg by mouth daily.   Yes [provider]  CHONDROITIN SULFATE PO Take 2 tablets by mouth daily.    Yes [provider]  clopidogrel (PLAVIX) 75 MG tablet Take 1 tablet (75 mg total) by mouth daily. 07/13/19 07/07/20 Yes Sherren Mocha, MD  co-enzyme Q-10 30 MG capsule Take 30 mg by mouth daily.   Yes [provider]  hydrochlorothiazide (MICROZIDE) 12.5 MG capsule TAKE 1 CAPSULE BY MOUTH EVERY DAY Patient taking differently: Take 12.5 mg by mouth daily.  03/11/19  Yes Wendie Agreste, MD  metoprolol succinate (TOPROL-XL) 25 MG 24 hr tablet Take 1 tablet (25 mg total) by mouth daily. Take with or immediately following a meal. 07/13/19 07/07/20 Yes Sherren Mocha, MD  naphazoline-pheniramine (VISINE) 0.025-0.3 % ophthalmic solution Place 1 drop into both eyes 4 (four) times daily as needed for eye irritation. Visine   Yes [provider]  nitroGLYCERIN (NITROSTAT) 0.4 MG SL tablet Place 1 tablet (0.4 mg total) under the tongue every 5 (five) minutes as needed for chest pain. 07/13/19  Yes Sherren Mocha, MD  vitamin E 180 MG (400 UNITS) capsule Take 400 Units by mouth daily.   Yes [provider]  Ginkgo Biloba Extract 60 MG CAPS Take 60 mg by mouth  daily.     [provider]  GOLDEN SEAL PO Take 550 mg by mouth daily.    [provider]  HAWTHORN PO Take 500 mg by mouth daily.     [provider]  KOREAN GINSENG PO Take 300 mg by mouth daily.    [provider]  KRILL OIL PO Take 500 mg by mouth daily.    [provider]  Saw Palmetto, Serenoa repens, (SAW PALMETTO PO) Take 1 Dose by mouth 2 (two) times a week.     [provider]  buPROPion (WELLBUTRIN SR) 100 MG 12 hr tablet Take 1 tablet (100 mg total) by mouth 2 (two) times daily. Start with 1 tab QD for first week or two, then can increase to BID if needed. 09/01/18   Wendie Agreste, MD   Social History  Socioeconomic History  . Marital status: Married    Spouse name: Not on file  . Number of children: Not on file  . Years of education: Not on file  . Highest education level: Not on file  Occupational History  . Occupation: Art gallery manager  Tobacco Use  . Smoking status: Former Smoker    Years: 15.00    Types: Cigarettes, Pipe, Cigars    Quit date: 02/26/1981    Years since quitting: 38.5  . Smokeless tobacco: Never Used  Vaping Use  . Vaping Use: Never used  Substance and Sexual Activity  . Alcohol use: No    Comment: no use at this time per pt  . Drug use: No  . Sexual activity: Yes  Other Topics Concern  . Not on file  Social History Narrative  . Not on file   Social Determinants of Health   Financial Resource Strain:   . Difficulty of Paying Living Expenses:   Food Insecurity:   . Worried About Charity fundraiser in the Last Year:   . Arboriculturist in the Last Year:   Transportation Needs:   . Film/video editor (Medical):   Marland Kitchen Lack of Transportation (Non-Medical):   Physical Activity:   . Days of Exercise per Week:   . Minutes of Exercise per Session:   Stress:   . Feeling of Stress :   Social Connections:   . Frequency of Communication with Friends and Family:   . Frequency of Social  Gatherings with Friends and Family:   . Attends Religious Services:   . Active Member of Clubs or Organizations:   . Attends Archivist Meetings:   Marland Kitchen Marital Status:   Intimate Partner Violence:   . Fear of Current or Ex-Partner:   . Emotionally Abused:   Marland Kitchen Physically Abused:   . Sexually Abused:     Review of Systems  Constitutional: Negative for fatigue and unexpected weight change.  Eyes: Negative for visual disturbance.  Respiratory: Negative for cough, chest tightness and shortness of breath.   Cardiovascular: Negative for chest pain, palpitations and leg swelling.  Gastrointestinal: Negative for abdominal pain and blood in stool.  Neurological: Negative for dizziness, light-headedness and headaches.     Objective:   Vitals:   08/20/19 1621  BP: 122/79  Pulse: 65  Temp: 98.1 F (36.7 C)  TempSrc: Temporal  SpO2: 97%  Weight: 196 lb (88.9 kg)  Height: 5\' 11"  (1.803 m)     Physical Exam Vitals reviewed.  Constitutional:      Appearance: He is well-developed.  HENT:     Head: Normocephalic and atraumatic.  Eyes:     Pupils: Pupils are equal, round, and reactive to light.  Neck:     Vascular: No carotid bruit or JVD.  Cardiovascular:     Rate and Rhythm: Normal rate and regular rhythm.     Heart sounds: Normal heart sounds. No murmur heard.   Pulmonary:     Effort: Pulmonary effort is normal.     Breath sounds: Normal breath sounds. No rales.  Skin:    General: Skin is warm and dry.  Neurological:     Mental Status: He is alert and oriented to person, place, and time.        Assessment & Plan:  Corey Cervantes is a 79 y.o. male . Coronary artery disease involving native heart without angina pectoris, unspecified vessel or lesion type History of percutaneous angioplasty -   -  Tolerating current med regimen.  Denies new bleeding with dual antiplatelet therapy.  Asymptomatic, overall feels better.  Continue follow-up with cardiology.  Need  for hepatitis C screening test - Plan: Hepatitis C antibody  Prediabetes - Plan: Hemoglobin A1c  -Repeat A1c, would consider low-dose Metformin for more aggressive risk factor modification.  Has made some significant changes in diet.  Hyperlipidemia, unspecified hyperlipidemia type  -Follow-up with cardiology, may qualify for PCSK9 inhibitor, I can place referral if needed to lipid clinic.  Essential hypertension  -Stable, no med changes  Recheck 3 months  No orders of the defined types were placed in this encounter.  Patient Instructions    Blood pressure ok today. No changes for now.   I do recommend meeting with Lipid clinic to determine if you are a candidate for a different cholesterol medication. Let me know if they need a referral.   Depending on A1c, could consider metformin.   Recheck in 3 months.  Great to see you today. Please let me know if any concerns in the meantime.     If you have lab work done today you will be contacted with your lab results within the next 2 weeks.  If you have not heard from [place] then please contact us. The fastest way to get your results is to register for My Chart.   IF you received an x-ray today, you will receive an invoice from [facility]. Please contact [facility] at [phone] with questions or concerns regarding your invoice.   IF you received labwork today, you will receive an invoice from [place]. Please contact [facility] at [phone] with questions or concerns regarding your invoice.   Our billing staff will not be able to assist you with questions regarding bills from these companies.  You will be contacted with the lab results as soon as they are available. The fastest way to get your results is to activate your My Chart account. Instructions are located on the last page of this paperwork. If you have not heard from [place] regarding the results in 2 weeks, please contact this office.          Signed, Merri Ray, MD [facility]

## 2019-08-20 NOTE — Patient Instructions (Addendum)
°  Blood pressure ok today. No changes for now.   I do recommend meeting with Lipid clinic to determine if you are a candidate for a different cholesterol medication. Let me know if they need a referral.   Depending on A1c, could consider metformin.   Recheck in 3 months.  Great to see you today. Please let me know if any concerns in the meantime.     If you have lab work done today you will be contacted with your lab results within the next 2 weeks.  If you have not heard from [place] then please contact us. The fastest way to get your results is to register for My Chart.   IF you received an x-ray today, you will receive an invoice from [facility]. Please contact [facility] at [phone] with questions or concerns regarding your invoice.   IF you received labwork today, you will receive an invoice from [facility]. Please contact [facility] at [phone] with questions or concerns regarding your invoice.   Our billing staff will not be able to assist you with questions regarding bills from these companies.  You will be contacted with the lab results as soon as they are available. The fastest way to get your results is to activate your My Chart account. Instructions are located on the last page of this paperwork. If you have not heard from [place] regarding the results in 2 weeks, please contact this office.

## 2019-08-21 LAB — HEMOGLOBIN A1C
Est. average glucose Bld gHb Est-mCnc: 134 mg/dL
Hgb A1c MFr Bld: 6.3 % — ABNORMAL HIGH (ref 4.8–5.6)

## 2019-08-21 LAB — HEPATITIS C ANTIBODY: Hep C Virus Ab: <0.1 s/co ratio (ref 0.0–0.9)

## 2019-09-02 ENCOUNTER — Ambulatory Visit: Payer: Medicare Other | Admitting: Family Medicine

## 2019-09-04 ENCOUNTER — Encounter: Payer: Self-pay | Admitting: Family Medicine

## 2019-09-04 NOTE — Telephone Encounter (Signed)
Pt has agreed he would like to start metformin for his numbers.

## 2019-09-05 MED ORDER — METFORMIN HCL 500 MG PO TABS: 500.0000 mg | ORAL_TABLET | Freq: Every day | ORAL | 1 refills | Status: DC

## 2019-09-07 ENCOUNTER — Ambulatory Visit: Payer: Self-pay | Admitting: Physician Assistant

## 2019-09-07 ENCOUNTER — Other Ambulatory Visit: Payer: Self-pay

## 2019-09-07 ENCOUNTER — Encounter: Payer: Self-pay | Admitting: Physician Assistant

## 2019-09-07 VITALS — BP 122/70 | HR 63 | Ht 71.0 in | Wt 196.0 lb

## 2019-09-07 DIAGNOSIS — I25118 Atherosclerotic heart disease of native coronary artery with other forms of angina pectoris: Secondary | ICD-10-CM

## 2019-09-07 DIAGNOSIS — E785 Hyperlipidemia, unspecified: Secondary | ICD-10-CM

## 2019-09-07 DIAGNOSIS — I10 Essential (primary) hypertension: Secondary | ICD-10-CM

## 2019-09-07 NOTE — Progress Notes (Signed)
Cardiology Office Note    Date:  09/07/2019   ID:  Rickard, Kennerly March 31, 1940, MRN [id number]  PCP:  Wendie Agreste, MD  Cardiologist: Dr. Burt Knack  Chief Complaint: Cath follow up  History of Present Illness:   Corey Cervantes is a 79 y.o. male with hx of HTN, HLD, family hx of CAD and recent dx of CAD presents for follow up.   Recent hx of CP and SOB concerning for angina. He had a gated coronary CTA demonstrating severe plaquing in the mid LAD. This was sent for CT-FFR analysis which was positive for ischemia in the LAD territory with a FFR of 0.73 just beyond the lesion in the mid vessel. The patient otherwise had nonobstructive stenosis in the right coronary artery, left main, and left circumflex.   Cath showed significant severe stenosis of the mid LAD treated with DES 3.0 x 30 mm. Severe right subclavian tortuosity precluded adequate visualization of the RCA. Moderate nonobstructive CAD in left Cx with negative FFR on CTA. Discharged on ASA and plavix for at least 6 months.  Here today for follow up.  No recurrent chest pain or shortness of breath.  He walks 2-1/2 miles 4 times per week.  No exertional symptoms.  Denies dizziness, orthopnea, PND, syncope, palpitation, lower extremity edema or blood in the stool or urine.  Past Medical History:  Diagnosis Date  . Allergy   . Arthritis   . CAD (coronary artery disease) 07/29/2019   DES to LAD  . Cancer [facility]) many years ago    melanoma  . Hx of adenomatous colonic polyps 08/30/2016  . Hyperlipidemia   . Hypertension     Past Surgical History:  Procedure Laterality Date  . APPENDECTOMY    . COLON SURGERY    . CORONARY ANGIOGRAPHY N/A 07/29/2019   Procedure: CORONARY ANGIOGRAPHY;  Surgeon: Sherren Mocha, MD;  Location: [place] [facility];  Service: Cardiovascular;  Laterality: N/A;  . CORONARY STENT INTERVENTION N/A 07/29/2019   Procedure: CORONARY STENT INTERVENTION;  Surgeon: Sherren Mocha, MD;  Location: [place] [facility];  Service: Cardiovascular;  Laterality: N/A;  . HERNIA REPAIR    . INTRAVASCULAR PRESSURE WIRE/FFR STUDY N/A 07/29/2019   Procedure: INTRAVASCULAR PRESSURE WIRE/FFR STUDY;  Surgeon: Sherren Mocha, MD;  Location: [place] [facility];  Service: Cardiovascular;  Laterality: N/A;  . MELANOMA EXCISION  1984    Current Medications: Prior to Admission medications   Medication Sig Start Date End Date Taking? Authorizing Provider  ALFALFA PO Take 300 mg by mouth daily.    Yes [provider]  ASHWAGANDHA PO Take 800 mg by mouth daily.   Yes [provider]  aspirin EC 81 MG tablet Take 1 tablet (81 mg total) by mouth daily. 05/07/13  Yes Sherren Mocha, MD  b complex vitamins tablet Take 1 tablet by mouth daily.   Yes [provider]  buPROPion (WELLBUTRIN) 100 MG tablet Take 100 mg by mouth daily.   Yes [provider]  Cholecalciferol (VITAMIN D-3 PO) Take 1,000 mg by mouth daily.   Yes [provider]  CHONDROITIN SULFATE PO Take 2 tablets by mouth daily.    Yes [provider]  clopidogrel (PLAVIX) 75 MG tablet Take 1 tablet (75 mg total) by mouth daily. 07/13/19 07/07/20 Yes Sherren Mocha, MD  co-enzyme Q-10 30 MG capsule Take 30 mg by mouth daily.   Yes [provider]  hydrochlorothiazide (MICROZIDE) 12.5 MG capsule TAKE 1 CAPSULE  BY MOUTH EVERY DAY Patient taking differently: Take 12.5 mg by mouth daily.  03/11/19  Yes Wendie Agreste, MD  metFORMIN (GLUCOPHAGE) 500 MG tablet Take 1 tablet (500 mg total) by mouth daily with breakfast. 09/05/19  Yes Wendie Agreste, MD  metoprolol succinate (TOPROL-XL) 25 MG 24 hr tablet Take 1 tablet (25 mg total) by mouth daily. Take with or immediately following a meal. 07/13/19 07/07/20 Yes Sherren Mocha, MD  naphazoline-pheniramine (VISINE) 0.025-0.3 % ophthalmic solution Place 1 drop into both eyes 4 (four) times daily as needed for eye irritation. Visine   Yes [provider]  nitroGLYCERIN (NITROSTAT) 0.4 MG SL tablet Place 1 tablet (0.4 mg total) under the tongue every 5 (five) minutes as needed for chest pain. 07/13/19  Yes Sherren Mocha, MD  vitamin E 180 MG (400 UNITS) capsule Take 400 Units by mouth daily.   Yes [provider]  buPROPion (WELLBUTRIN SR) 100 MG 12 hr tablet Take 1 tablet (100 mg total) by mouth 2 (two) times daily. Start with 1 tab QD for first week or two, then can increase to BID if needed. 09/01/18   Wendie Agreste, MD    Allergies:   Codeine, Lipitor [atorvastatin], and Pravastatin   Social History   Socioeconomic History  . Marital status: Married    Spouse name: Not on file  . Number of children: Not on file  . Years of education: Not on file  . Highest education level: Not on file  Occupational History  . Occupation: Art gallery manager  Tobacco Use  . Smoking status: Former Smoker    Years: 15.00    Types: Cigarettes, Pipe, Cigars    Quit date: 02/26/1981    Years since quitting: 38.5  . Smokeless tobacco: Never Used  Vaping Use  . Vaping Use: Never used  Substance and Sexual Activity  . Alcohol use: No    Comment: no use at this time per pt  . Drug use: No  . Sexual activity: Yes  Other Topics Concern  . Not on file  Social History Narrative  . Not on file   Social Determinants of Health   Financial Resource Strain:   . Difficulty of Paying Living Expenses:   Food Insecurity:   . Worried About Charity fundraiser in the Last Year:   . Arboriculturist in the Last Year:   Transportation Needs:   . Film/video editor (Medical):   Marland Kitchen Lack of Transportation (Non-Medical):   Physical Activity:   . Days of Exercise per Week:   . Minutes of Exercise per Session:   Stress:   . Feeling of Stress :   Social Connections:   . Frequency of Communication with Friends and Family:   . Frequency of Social Gatherings with Friends and Family:   . Attends Religious Services:   . Active Member of  Clubs or Organizations:   . Attends Archivist Meetings:   Marland Kitchen Marital Status:      Family History:  The patient's family history includes Dementia in his mother; Heart disease in his father; Multiple sclerosis in his sister.   ROS:   Please see the history of present illness.    ROS All other systems reviewed and are negative.   PHYSICAL EXAM:   VS:  BP 122/70   Pulse 63   Ht 5\' 11"  (1.803 m)   Wt 196 lb (88.9 kg)   SpO2 97%   BMI 27.34 kg/m  GEN: Well nourished, well developed, in no acute distress  HEENT: normal  Neck: no JVD, carotid bruits, or masses Cardiac: RRR; no murmurs, rubs, or gallops,no edema  Respiratory:  clear to auscultation bilaterally, normal work of breathing GI: soft, nontender, nondistended, + BS MS: no deformity or atrophy  Skin: warm and dry, no rash Neuro:  Alert and Oriented x 3, Strength and sensation are intact Psych: euthymic mood, full affect  Wt Readings from Last 3 Encounters:  09/07/19 196 lb (88.9 kg)  08/20/19 196 lb (88.9 kg)  07/29/19 197 lb 15.6 oz (89.8 kg)      Studies/Labs Reviewed:   EKG:  EKG is ordered today.  The ekg ordered today demonstrates sinus rhythm at rate of 63 bpm  Recent Labs: 03/11/2019: ALT 14; TSH 2.220 07/28/2019: BUN 22; Creatinine, Ser 1.31; Hemoglobin 13.5; Platelets 208; Potassium 4.2; Sodium 137   Lipid Panel    Component Value Date/Time   CHOL 232 (H) 03/11/2019 1200   TRIG 126 03/11/2019 1200   HDL 53 03/11/2019 1200   CHOLHDL 4.4 03/11/2019 1200   CHOLHDL 4.7 11/03/2015 1005   VLDL 14 11/03/2015 1005   LDLCALC 156 (H) 03/11/2019 1200    Additional studies/ records that were reviewed today include:    CORONARY ANGIOGRAPHY  07/29/19  CORONARY STENT INTERVENTION  INTRAVASCULAR PRESSURE WIRE/FFR STUDY  Conclusion  1.  Hemodynamically significant severe stenosis of the mid LAD, treated successfully with PCI using a 3.0 x 30 mm resolute Onyx DES 2.  Moderate nonobstructive stenosis of  the left circumflex with negative CT-FFR on precatheterization imaging 3.  Nonobstructive stenosis of the RCA, poorly visualized by invasive angiography secondary to inability to selectively engage the vessel in the setting of severe right subclavian tortuosity.  Recommend: Same-day PCI protocol as long as no early complications arise.  Dual antiplatelet therapy with aspirin and clopidogrel for 6 months without interruption.  Aggressive risk modification.  Consider referral to lipid clinic for PCSK9 inhibitor therapy.  Diagnostic Dominance: Right  Intervention     ASSESSMENT & PLAN:    1. CAD s/p DES to LAD -No angina.  Continue exercise regimen.  Continue dual antiplatelet therapy with aspirin and Plavix.  Continue beta-blocker.  2. HTN -Blood pressure stable and controlled on current medications  3. HLD - Statin intolerance  - 03/11/2019: Cholesterol, Total 232; HDL 53; LDL Chol Calc (NIH) 156; Triglycerides 126 -Referral to lipid clinic for further evaluation  4. Pre-diabetics - H1c 6.3. PCP has started him on metformin.   Medication Adjustments/Labs and Tests Ordered: Current medicines are reviewed at length with the patient today.  Concerns regarding medicines are outlined above.  Medication changes, Labs and Tests ordered today are listed in the Patient Instructions below. Patient Instructions  Medication Instructions:  Your physician recommends that you continue on your current medications as directed. Please refer to the Current Medication list given to you today.  *If you need a refill on your cardiac medications before your next appointment, please call your pharmacy*   Lab Work: None ordered  If you have labs (blood work) drawn today and your tests are completely normal, you will receive your results only by: Marland Kitchen MyChart Message (if you have MyChart) OR . A paper copy in the mail If you have any lab test that is abnormal or we need to change your treatment, we  will call you to review the results.   Testing/Procedures: None ordered but You have been referred to Lipid Clinic.  Follow-Up: At [facility], you and your health needs are our priority.  As part of our continuing mission to provide you with exceptional heart care, we have created designated Provider Care Teams.  These Care Teams include your primary Cardiologist (physician) and Advanced Practice Providers (APPs -  Physician Assistants and Nurse Practitioners) who all work together to provide you with the care you need, when you need it.  We recommend signing up for the patient portal called "MyChart".  Sign up information is provided on this After Visit Summary.  MyChart is used to connect with patients for Virtual Visits (Telemedicine).  Patients are able to view lab/test results, encounter notes, upcoming appointments, etc.  Non-urgent messages can be sent to your provider as well.   To learn more about what you can do with MyChart, go to [email or web address].    Your next appointment:   4 month(s)  The format for your next appointment:   In Person  Provider:   Sherren Mocha, MD   Other Instructions      Signed, Leanor Kail, PA  09/07/2019 8:24 AM    [facility] [address], [place], [place]  [address] Phone: [phone]; Fax: [phone]

## 2019-09-07 NOTE — Patient Instructions (Signed)
Medication Instructions:  Your physician recommends that you continue on your current medications as directed. Please refer to the Current Medication list given to you today.  *If you need a refill on your cardiac medications before your next appointment, please call your pharmacy*   Lab Work: None ordered  If you have labs (blood work) drawn today and your tests are completely normal, you will receive your results only by: Marland Kitchen MyChart Message (if you have MyChart) OR . A paper copy in the mail If you have any lab test that is abnormal or we need to change your treatment, we will call you to review the results.   Testing/Procedures: None ordered but You have been referred to Lipid Clinic.     Follow-Up: At [facility], you and your health needs are our priority.  As part of our continuing mission to provide you with exceptional heart care, we have created designated Provider Care Teams.  These Care Teams include your primary Cardiologist (physician) and Advanced Practice Providers (APPs -  Physician Assistants and Nurse Practitioners) who all work together to provide you with the care you need, when you need it.  We recommend signing up for the patient portal called "MyChart".  Sign up information is provided on this After Visit Summary.  MyChart is used to connect with patients for Virtual Visits (Telemedicine).  Patients are able to view lab/test results, encounter notes, upcoming appointments, etc.  Non-urgent messages can be sent to your provider as well.   To learn more about what you can do with MyChart, go to [email or web address].    Your next appointment:   4 month(s)  The format for your next appointment:   In Person  Provider:   Sherren Mocha, MD   Other Instructions

## 2019-09-11 ENCOUNTER — Encounter (HOSPITAL_COMMUNITY): Payer: Self-pay

## 2019-09-11 ENCOUNTER — Telehealth (HOSPITAL_COMMUNITY): Payer: Self-pay

## 2019-09-11 NOTE — Telephone Encounter (Signed)
Attempted to call patient in regards to Cardiac Rehab - LM on VM Mailed letter 

## 2019-09-14 NOTE — Progress Notes (Signed)
Patient ID: Corey Cervantes                 DOB: 26-Jul-1940                    MRN: [id number]     HPI: Corey Cervantes is a 79 y.o. male patient referred to lipid clinic by Robbie Lis, PA. PMH is significant for newly diagnosed CAD s/p PCI (07/2019), HTN, HLD, family hx of CAD, and a former smoker. Pt reported exertional dyspnea at his office visit with Dr Burt Knack in April 2021. He then underwent coronary CT on 06/2019 which showed a calcium score of 1149, (78th percentile) which prompted a CT FFR which showed significant focal stenosis in the mid LAD region with FFR = 0.71. LHC on 6/2//21 showed severe stenosis of the mid LAD treated with PCI.  At last office visit on 09/07/19, Vin reviewed lipid panel with patient (LDL 156, TC 232). Patient is statin intolerant, not on any lipid therapies, and was referred to pharmacy for lipid management.   Patient presents today in good spirits. He confirms that he is not taking any lipid medications at this time. He reports that he has tried atorvastatin and pravastatin in the past and they caused him to experience muscle weakness. He drinks three cups of coffee daily, drinks irish whiskey and scotch occasionally 3 times a week and glass of wine with dinner (worked in the [employer]). He eats fish, chicken, whole grain bread, pasta, salads, vegetables (spinach and kale), portion sizes with protein, and limits red meat. He is currently working with cardiac rehab in the mornings and also walks 2.5 miles 3-4 times a week for 40-45 mins.   Current Medications: None Intolerances: Atorvastatin 10 mg and Pravastatin 40 mg (Malaise, myalgia) Risk Factors: CAD, HTN, HLD, former smoker, Fhx of CAD LDL goal: < 70 mg/dL  Diet: drinks three cups of coffee daily, drinks irish whiskey and scotch occasionally 3 times a week and glass of wine with dinner, (worked in the wine business); Eats fish, chicken, whole grain bread, pasta, salads, vegetables (spinach and kale), portion  sizes with protein, and limits red meat  Exercise: working with cardiac rehab in the mornings and also walks 2.5 miles 3-4 times a week for 40-45 mins.   Family History: Heart Disease (father), Son with MI at 62 y.o.  Social History: Former smoker - used cigarettes, pipes, and cigars for 15 years (quit 02/1981); drinks irish whiskey and scotch occasionally 3 times a week and glass of wine with dinner, denies smokeless tobacco and illicit drugs   Labs: 81/1914: LDL 156, TC 232, HDL 53, TG 126 (no lipid therapy)  Past Medical History:  Diagnosis Date  . Allergy   . Arthritis   . CAD (coronary artery disease) 07/29/2019   DES to LAD  . Cancer [facility]) many years ago    melanoma  . Hx of adenomatous colonic polyps 08/30/2016  . Hyperlipidemia   . Hypertension     Current Outpatient Medications on File Prior to Visit  Medication Sig Dispense Refill  . ALFALFA PO Take 300 mg by mouth daily.     . ASHWAGANDHA PO Take 800 mg by mouth daily.    Marland Kitchen aspirin EC 81 MG tablet Take 1 tablet (81 mg total) by mouth daily.    Marland Kitchen b complex vitamins tablet Take 1 tablet by mouth daily.    Marland Kitchen buPROPion (WELLBUTRIN) 100 MG tablet Take 100 mg by mouth  daily.    . Cholecalciferol (VITAMIN D-3 PO) Take 1,000 mg by mouth daily.    . CHONDROITIN SULFATE PO Take 2 tablets by mouth daily.     . clopidogrel (PLAVIX) 75 MG tablet Take 1 tablet (75 mg total) by mouth daily. 90 tablet 3  . co-enzyme Q-10 30 MG capsule Take 30 mg by mouth daily.    . hydrochlorothiazide (MICROZIDE) 12.5 MG capsule TAKE 1 CAPSULE BY MOUTH EVERY DAY (Patient taking differently: Take 12.5 mg by mouth daily. ) 90 capsule 2  . metFORMIN (GLUCOPHAGE) 500 MG tablet Take 1 tablet (500 mg total) by mouth daily with breakfast. 90 tablet 1  . metoprolol succinate (TOPROL-XL) 25 MG 24 hr tablet Take 1 tablet (25 mg total) by mouth daily. Take with or immediately following a meal. 90 tablet 3  . naphazoline-pheniramine (VISINE) 0.025-0.3 % ophthalmic  solution Place 1 drop into both eyes 4 (four) times daily as needed for eye irritation. Visine    . nitroGLYCERIN (NITROSTAT) 0.4 MG SL tablet Place 1 tablet (0.4 mg total) under the tongue every 5 (five) minutes as needed for chest pain. 25 tablet 1  . vitamin E 180 MG (400 UNITS) capsule Take 400 Units by mouth daily.    . [DISCONTINUED] buPROPion (WELLBUTRIN SR) 100 MG 12 hr tablet Take 1 tablet (100 mg total) by mouth 2 (two) times daily. Start with 1 tab QD for first week or two, then can increase to BID if needed. 60 tablet 1   Current Facility-Administered Medications on File Prior to Visit  Medication Dose Route Frequency Provider Last Rate Last Admin  . 0.9 %  sodium chloride infusion  500 mL Intravenous Continuous Gatha Mayer, MD        Allergies  Allergen Reactions  . Codeine Other (See Comments)    NIGHTMARES  . Lipitor [Atorvastatin] Other (See Comments)    Malaise, myalgia.   . Pravastatin Other (See Comments)    Myalgia, malaise.     Assessment/Plan:  1. Hyperlipidemia -  LDL 156 above goal < 70 due to CAD noted on calcium score and LHC s/p PCI. Pt is intolerant to atorvastatin and pravastatin. Discussed expected benefits, side effects, and injection technique of Repatha and patient agreed to start therapy. Patient's insurance has approved prior authorization through 03/17/20 and prescription has been sent to his preferred pharmacy. Patient states he can afford the $47 copay. Informed him to contact pharmacy if he has any questions or issues with Repatha. Will plan to recheck fasting lipids on September 20th after starting therapy.    Lorel Monaco, PharmD PGY2 [place] [address], [place], [place] [address] Phone: [phone]; Fax: [phone]

## 2019-09-15 ENCOUNTER — Ambulatory Visit (INDEPENDENT_AMBULATORY_CARE_PROVIDER_SITE_OTHER): Payer: Medicare Other | Admitting: Pharmacist

## 2019-09-15 ENCOUNTER — Other Ambulatory Visit: Payer: Self-pay

## 2019-09-15 DIAGNOSIS — G720 Drug-induced myopathy: Secondary | ICD-10-CM | POA: Diagnosis not present

## 2019-09-15 DIAGNOSIS — E785 Hyperlipidemia, unspecified: Secondary | ICD-10-CM

## 2019-09-15 DIAGNOSIS — T466X5A Adverse effect of antihyperlipidemic and antiarteriosclerotic drugs, initial encounter: Secondary | ICD-10-CM

## 2019-09-15 MED ORDER — REPATHA SURECLICK 140 MG/ML ~~LOC~~ SOAJ: 1.0000 "pen " | SUBCUTANEOUS | 11 refills | Status: DC

## 2019-09-15 NOTE — Patient Instructions (Signed)
Nice to see you today!  Keep up the good work with diet and exercise. Aim for a diet full of vegetables, fruit and lean meats (chicken, [place], fish). Try to limit carbs (bread, pasta, sugar, rice) and red meat consumption.  Your goal LDL is < 70 mg/dL, you're currently at 156 mg/dL  Medication Changes: Begin Repatha once you pick it up from your [place], inject once every other week (any day of the week that works for you) into the fatty skin of stomach, upper outer thigh or back of the arm. Clean the site with soap and warm water or an alcohol pad. Keep the medication in the fridge until you are ready to give your dose, then take it out and let warm up to room temperature for 30-60 mins.  Please come fasting on September 20th anytime after 7:30AM to get your labs done  Please give [place] a call at [phone] with any questions or concerns.

## 2019-09-24 ENCOUNTER — Telehealth (HOSPITAL_COMMUNITY): Payer: Self-pay | Admitting: Pharmacy Technician

## 2019-09-24 NOTE — Telephone Encounter (Signed)
Cardiac Rehab Medication Review by a Pharmacist  Does the patient  feel that his/her medications are working for him/her?  yes  Has the patient been experiencing any side effects to the medications prescribed?  no  Does the patient measure his/her own blood pressure or blood glucose at home?  yes - BP only  Patient measures his blood pressure before meds, after meds, and throughout the day. Patient states his blood pressure averages 120/60's. He denies symptoms of lightheadedness but does feel dizzy if he turns too fast. He reports dizziness is not related to taking his medication.   Patient does report he had an episode where his SBP was 172 after working outdoors in the heat. His blood pressure came down to SBP 125 after cooling down.  Does the patient have any problems obtaining medications due to transportation or finances?   no  Understanding of regimen: good Understanding of indications: good Potential of compliance: excellent   Pharmacist Intervention: N/A   Romilda Garret, PharmD PGY1 Acute Care Pharmacy Resident Phone: [phone] 09/24/2019 3:31 PM  Please check [email or web address] for unit specific pharmacy phone numbers.

## 2019-09-29 ENCOUNTER — Encounter (HOSPITAL_COMMUNITY)
Admission: RE | Admit: 2019-09-29 | Discharge: 2019-09-29 | Disposition: A | Discharge status: Home / Self Care | Payer: Medicare Other | Source: Ambulatory Visit | Attending: Cardiovascular Disease | Admitting: Cardiovascular Disease

## 2019-09-29 ENCOUNTER — Other Ambulatory Visit: Payer: Self-pay

## 2019-09-29 ENCOUNTER — Telehealth (HOSPITAL_COMMUNITY): Payer: Self-pay | Admitting: *Deleted

## 2019-09-29 DIAGNOSIS — Z955 Presence of coronary angioplasty implant and graft: Secondary | ICD-10-CM | POA: Insufficient documentation

## 2019-09-29 NOTE — Telephone Encounter (Signed)
Spoke with patient. Confirmed orientation appointment for 10/01/19. Completed health history.Barnet Pall, RN,BSN 09/29/2019 12:13 PM

## 2019-10-01 ENCOUNTER — Encounter (HOSPITAL_COMMUNITY): Payer: Self-pay

## 2019-10-01 ENCOUNTER — Other Ambulatory Visit: Payer: Self-pay

## 2019-10-01 ENCOUNTER — Encounter (HOSPITAL_COMMUNITY)
Admission: RE | Admit: 2019-10-01 | Discharge: 2019-10-01 | Disposition: A | Discharge status: Home / Self Care | Payer: Medicare Other | Source: Ambulatory Visit | Attending: Cardiovascular Disease | Admitting: Cardiovascular Disease

## 2019-10-01 VITALS — BP 142/78 | HR 63 | Ht 69.75 in | Wt 195.3 lb

## 2019-10-01 DIAGNOSIS — Z955 Presence of coronary angioplasty implant and graft: Secondary | ICD-10-CM

## 2019-10-01 LAB — GLUCOSE, CAPILLARY: Glucose-Capillary: 108 mg/dL — ABNORMAL HIGH (ref 70–99)

## 2019-10-01 NOTE — Progress Notes (Signed)
Cardiac Individual Treatment Plan  Patient Details  Name: Corey Cervantes MRN: [id number] Date of Birth: 1940-06-25 Referring Provider:     CARDIAC REHAB PHASE II ORIENTATION from 10/01/2019 in [place]  Referring Provider Sherren Mocha, MD      Initial Encounter Date:    CARDIAC REHAB PHASE II ORIENTATION from 10/01/2019 in [place]  Date 10/01/19      Visit Diagnosis: S/P DES mLAD 07/29/19  Patient's Home Medications on Admission:  Current Outpatient Medications:    aspirin EC 81 MG tablet, Take 1 tablet (81 mg total) by mouth daily., Disp: , Rfl:    b complex vitamins tablet, Take 1 tablet by mouth daily., Disp: , Rfl:    buPROPion (WELLBUTRIN) 100 MG tablet, Take 100 mg by mouth daily., Disp: , Rfl:    Cholecalciferol (VITAMIN D-3) 25 MCG (1000 UT) CAPS, Take 1,000 Units by mouth daily. , Disp: , Rfl:    CHONDROITIN SULFATE PO, Take 2 tablets by mouth daily. , Disp: , Rfl:    clopidogrel (PLAVIX) 75 MG tablet, Take 1 tablet (75 mg total) by mouth daily., Disp: 90 tablet, Rfl: 3   co-enzyme Q-10 30 MG capsule, Take 30 mg by mouth daily., Disp: , Rfl:    Evolocumab (REPATHA SURECLICK) 329 MG/ML SOAJ, Inject 1 pen into the skin every 14 (fourteen) days., Disp: 2 pen, Rfl: 11   hydrochlorothiazide (MICROZIDE) 12.5 MG capsule, TAKE 1 CAPSULE BY MOUTH EVERY DAY (Patient taking differently: Take 12.5 mg by mouth daily. ), Disp: 90 capsule, Rfl: 2   metFORMIN (GLUCOPHAGE) 500 MG tablet, Take 1 tablet (500 mg total) by mouth daily with breakfast., Disp: 90 tablet, Rfl: 1   metoprolol succinate (TOPROL-XL) 25 MG 24 hr tablet, Take 1 tablet (25 mg total) by mouth daily. Take with or immediately following a meal., Disp: 90 tablet, Rfl: 3   naphazoline-pheniramine (VISINE) 0.025-0.3 % ophthalmic solution, Place 1 drop into both eyes 4 (four) times daily as needed for eye irritation. Visine, Disp: , Rfl:     nitroGLYCERIN (NITROSTAT) 0.4 MG SL tablet, Place 1 tablet (0.4 mg total) under the tongue every 5 (five) minutes as needed for chest pain., Disp: 25 tablet, Rfl: 1   vitamin E 180 MG (400 UNITS) capsule, Take 400 Units by mouth daily., Disp: , Rfl:    ASHWAGANDHA PO, Take 1 tablet by mouth daily.  (Patient not taking: Reported on 10/01/2019), Disp: , Rfl:   Current Facility-Administered Medications:    0.9 %  sodium chloride infusion, 500 mL, Intravenous, Continuous, Gatha Mayer, MD  Past Medical History: Past Medical History:  Diagnosis Date   Allergy    Arthritis    CAD (coronary artery disease) 07/29/2019   DES to LAD   Cancer [facility]) many years ago    melanoma   Hx of adenomatous colonic polyps 08/30/2016   Hyperlipidemia    Hypertension     Tobacco Use: Social History   Tobacco Use  Smoking Status Former Smoker   Years: 15.00   Types: Cigarettes, Pipe, Cigars   Quit date: 02/26/1981   Years since quitting: 38.6  Smokeless Tobacco Never Used    Labs: Recent Chemical engineer    Labs for ITP Cardiac and Pulmonary Rehab Latest Ref Rng & Units 03/11/2017 03/07/2018 09/01/2018 03/11/2019 08/20/2019   Cholestrol 100 - 199 mg/dL 250(H) 221(H) 238(H) 232(H) -   LDLCALC 0 - 99 mg/dL 178(H) 157(H) 164(H) 156(H) -  HDL >39 mg/dL 59 47 56 53 -   Trlycerides 0 - 149 mg/dL 66 87 91 126 -   Hemoglobin A1c 4.8 - 5.6 % 6.0(H) 6.4(H) 6.2(H) 6.1(H) 6.3(H)      Capillary Blood Glucose: Lab Results  Component Value Date   GLUCAP 108 (H) 10/01/2019     Exercise Target Goals: Exercise Program Goal: Individual exercise prescription set using results from initial 6 min walk test and THRR while considering  patients activity barriers and safety.   Exercise Prescription Goal: Starting with aerobic activity 30 plus minutes a day, 3 days per week for initial exercise prescription. Provide home exercise prescription and guidelines that participant acknowledges understanding  prior to discharge.  Activity Barriers & Risk Stratification:  Activity Barriers & Cardiac Risk Stratification - 10/01/19 1152      Activity Barriers & Cardiac Risk Stratification   Activity Barriers Joint Problems;Balance Concerns   Right knee pain at times, Right shoulder   Cardiac Risk Stratification High           6 Minute Walk:  6 Minute Walk    Row Name 10/01/19 1149         6 Minute Walk   Phase Initial     Distance 1871 feet     Walk Time 6 minutes     # of Rest Breaks 0     MPH 2.37     METS 4.51     RPE 7     Perceived Dyspnea  0     VO2 Peak 15.79     Symptoms No     Resting HR 63 bpm     Resting BP 142/78     Resting Oxygen Saturation  98 %     Exercise Oxygen Saturation  during 6 min walk 97 %     Max Ex. HR 97 bpm     Max Ex. BP 140/72     2 Minute Post BP 128/66            Oxygen Initial Assessment:   Oxygen Re-Evaluation:   Oxygen Discharge (Final Oxygen Re-Evaluation):   Initial Exercise Prescription:  Initial Exercise Prescription - 10/01/19 1100      Date of Initial Exercise RX and Referring Provider   Date 10/01/19    Referring Provider Sherren Mocha, MD    Expected Discharge Date 11/27/19      Bike   Level 2    Minutes 15    METs 3      NuStep   Level 3    SPM 85    Minutes 15    METs 2.2      Prescription Details   Frequency (times per week) 3    Duration Progress to 30 minutes of continuous aerobic without signs/symptoms of physical distress      Intensity   THRR 40-80% of Max Heartrate 57-114    Ratings of Perceived Exertion 11-13    Perceived Dyspnea 0-4      Progression   Progression Continue progressive overload as per policy without signs/symptoms or physical distress.      Resistance Training   Training Prescription Yes    Weight 3 lbs    Reps 10-15           Perform Capillary Blood Glucose checks as needed.  Exercise Prescription Changes:   Exercise Comments:   Exercise Goals and  Review:   Exercise Goals    Row Name 10/01/19 1157  Exercise Goals   Increase Physical Activity Yes       Intervention Provide advice, education, support and counseling about physical activity/exercise needs.;Develop an individualized exercise prescription for aerobic and resistive training based on initial evaluation findings, risk stratification, comorbidities and participant's personal goals.       Expected Outcomes Short Term: Attend rehab on a regular basis to increase amount of physical activity.;Long Term: Add in home exercise to make exercise part of routine and to increase amount of physical activity.;Long Term: Exercising regularly at least 3-5 days a week.       Increase Strength and Stamina Yes       Intervention Provide advice, education, support and counseling about physical activity/exercise needs.;Develop an individualized exercise prescription for aerobic and resistive training based on initial evaluation findings, risk stratification, comorbidities and participant's personal goals.       Expected Outcomes Short Term: Increase workloads from initial exercise prescription for resistance, speed, and METs.;Short Term: Perform resistance training exercises routinely during rehab and add in resistance training at home;Long Term: Improve cardiorespiratory fitness, muscular endurance and strength as measured by increased METs and functional capacity (6MWT)       Able to understand and use rate of perceived exertion (RPE) scale Yes       Intervention Provide education and explanation on how to use RPE scale       Expected Outcomes Short Term: Able to use RPE daily in rehab to express subjective intensity level;Long Term:  Able to use RPE to guide intensity level when exercising independently       Knowledge and understanding of Target Heart Rate Range (THRR) Yes       Intervention Provide education and explanation of THRR including how the numbers were predicted and where they  are located for reference       Expected Outcomes Short Term: Able to state/look up THRR;Long Term: Able to use THRR to govern intensity when exercising independently       Able to check pulse independently Yes       Intervention Provide education and demonstration on how to check pulse in carotid and radial arteries.;Review the importance of being able to check your own pulse for safety during independent exercise       Expected Outcomes Short Term: Able to explain why pulse checking is important during independent exercise;Long Term: Able to check pulse independently and accurately       Understanding of Exercise Prescription Yes       Intervention Provide education, explanation, and written materials on patient's individual exercise prescription       Expected Outcomes Short Term: Able to explain program exercise prescription;Long Term: Able to explain home exercise prescription to exercise independently              Exercise Goals Re-Evaluation :    Discharge Exercise Prescription (Final Exercise Prescription Changes):   Nutrition:  Target Goals: Understanding of nutrition guidelines, daily intake of sodium 1500mg , cholesterol 200mg , calories 30% from fat and 7% or less from saturated fats, daily to have 5 or more servings of fruits and vegetables.  Biometrics:  Pre Biometrics - 10/01/19 0845      Pre Biometrics   Waist Circumference 42 inches    Hip Circumference 43.25 inches    Waist to Hip Ratio 0.97 %    Triceps Skinfold 22 mm    % Body Fat 30.4 %    Grip Strength 38 kg    Flexibility 13.75 in  Single Leg Stand 3.68 seconds   High fall Risk           Nutrition Therapy Plan and Nutrition Goals:   Nutrition Assessments:   Nutrition Goals Re-Evaluation:   Nutrition Goals Discharge (Final Nutrition Goals Re-Evaluation):   Psychosocial: Target Goals: Acknowledge presence or absence of significant depression and/or stress, maximize coping skills, provide  positive support system. Participant is able to verbalize types and ability to use techniques and skills needed for reducing stress and depression.  Initial Review & Psychosocial Screening:  Initial Psych Review & Screening - 10/01/19 0935      Initial Review   Current issues with Current Stress Concerns    Source of Stress Concerns Family    Comments Ralph's son had a Mi with cardiac arrest on memorial day right before his hospital admission. Aiken is still concerned about his health      [place]? Yes   Jonanthony has his wife and children for support   Comments Will offer emotional support as needed. Jamon says his son is doing better now      Barriers   Psychosocial barriers to participate in program The patient should benefit from training in stress management and relaxation.      Screening Interventions   Interventions Encouraged to exercise    Expected Outcomes Long Term Goal: Stressors or current issues are controlled or eliminated.           Quality of Life Scores:  Quality of Life - 10/01/19 1141      Quality of Life   Select Quality of Life      Quality of Life Scores   Health/Function Pre 27.07 %    Socioeconomic Pre 25.81 %    Psych/Spiritual Pre 25.5 %    Family Pre 28.8 %    GLOBAL Pre 26.71 %          Scores of 19 and below usually indicate a poorer quality of life in these areas.  A difference of  2-3 points is a clinically meaningful difference.  A difference of 2-3 points in the total score of the Quality of Life Index has been associated with significant improvement in overall quality of life, self-image, physical symptoms, and general health in studies assessing change in quality of life.  PHQ-9: Recent Review Flowsheet Data    Depression screen [facility] 2/9 10/01/2019 08/20/2019 03/11/2019 10/09/2018 09/01/2018   Decreased Interest 0 0 0 0 0   Down, Depressed, Hopeless 0 0 0 0 0   PHQ - 2 Score 0 0 0 0 0     Interpretation of  Total Score  Total Score Depression Severity:  1-4 = Minimal depression, 5-9 = Mild depression, 10-14 = Moderate depression, 15-19 = Moderately severe depression, 20-27 = Severe depression   Psychosocial Evaluation and Intervention:   Psychosocial Re-Evaluation:   Psychosocial Discharge (Final Psychosocial Re-Evaluation):   Vocational Rehabilitation: Provide vocational rehab assistance to qualifying candidates.   Vocational Rehab Evaluation & Intervention:  Vocational Rehab - 10/01/19 0935      Initial Vocational Rehab Evaluation & Intervention   Assessment shows need for Vocational Rehabilitation No   Arlander is currently working and does not need vocational rehab at this time.          Education: Education Goals: Education classes will be provided on a weekly basis, covering required topics. Participant will state understanding/return demonstration of topics presented.  Learning Barriers/Preferences:  Learning Barriers/Preferences - 10/01/19 1142  Learning Barriers/Preferences   Learning Barriers Sight;Hearing   Wears glasses, HOH   Learning Preferences Computer/Internet;Pictoral;Skilled Demonstration;Video           Education Topics: Hypertension, Hypertension Reduction -Define heart disease and high blood pressure. Discus how high blood pressure affects the body and ways to reduce high blood pressure.   Exercise and Your Heart -Discuss why it is important to exercise, the FITT principles of exercise, normal and abnormal responses to exercise, and how to exercise safely.   Angina -Discuss definition of angina, causes of angina, treatment of angina, and how to decrease risk of having angina.   Cardiac Medications -Review what the following cardiac medications are used for, how they affect the body, and side effects that may occur when taking the medications.  Medications include Aspirin, Beta blockers, calcium channel blockers, ACE Inhibitors, angiotensin  receptor blockers, diuretics, digoxin, and antihyperlipidemics.   Congestive Heart Failure -Discuss the definition of CHF, how to live with CHF, the signs and symptoms of CHF, and how keep track of weight and sodium intake.   Heart Disease and Intimacy -Discus the effect sexual activity has on the heart, how changes occur during intimacy as we age, and safety during sexual activity.   Smoking Cessation / COPD -Discuss different methods to quit smoking, the health benefits of quitting smoking, and the definition of COPD.   Nutrition I: Fats -Discuss the types of cholesterol, what cholesterol does to the heart, and how cholesterol levels can be controlled.   Nutrition II: Labels -Discuss the different components of food labels and how to read food label   Heart Parts/Heart Disease and PAD -Discuss the anatomy of the heart, the pathway of blood circulation through the heart, and these are affected by heart disease.   Stress I: Signs and Symptoms -Discuss the causes of stress, how stress may lead to anxiety and depression, and ways to limit stress.   Stress II: Relaxation -Discuss different types of relaxation techniques to limit stress.   Warning Signs of Stroke / TIA -Discuss definition of a stroke, what the signs and symptoms are of a stroke, and how to identify when someone is having stroke.   Knowledge Questionnaire Score:  Knowledge Questionnaire Score - 10/01/19 1146      Knowledge Questionnaire Score   Pre Score 22/24           Core Components/Risk Factors/Patient Goals at Admission:  Personal Goals and Risk Factors at Admission - 10/01/19 1146      Core Components/Risk Factors/Patient Goals on Admission    Weight Management Yes;Weight Maintenance    Admit Weight 195 lb 5.2 oz (88.6 kg)    Expected Outcomes Short Term: Continue to assess and modify interventions until short term weight is achieved;Long Term: Adherence to nutrition and physical  activity/exercise program aimed toward attainment of established weight goal;Weight Maintenance: Understanding of the daily nutrition guidelines, which includes 25-35% calories from fat, 7% or less cal from saturated fats, less than 200mg  cholesterol, less than 1.5gm of sodium, & 5 or more servings of fruits and vegetables daily;Understanding recommendations for meals to include 15-35% energy as protein, 25-35% energy from fat, 35-60% energy from carbohydrates, less than 200mg  of dietary cholesterol, 20-35 gm of total fiber daily;Understanding of distribution of calorie intake throughout the day with the consumption of 4-5 meals/snacks    Diabetes Yes    Intervention Provide education about signs/symptoms and action to take for hypo/hyperglycemia.;Provide education about proper nutrition, including hydration, and aerobic/resistive exercise prescription along  with prescribed medications to achieve blood glucose in normal ranges: Fasting glucose 65-99 mg/dL    Expected Outcomes Short Term: Participant verbalizes understanding of the signs/symptoms and immediate care of hyper/hypoglycemia, proper foot care and importance of medication, aerobic/resistive exercise and nutrition plan for blood glucose control.;Long Term: Attainment of HbA1C < 7%.    Hypertension Yes    Intervention Provide education on lifestyle modifcations including regular physical activity/exercise, weight management, moderate sodium restriction and increased consumption of fresh fruit, vegetables, and low fat dairy, alcohol moderation, and smoking cessation.;Monitor prescription use compliance.    Expected Outcomes Short Term: Continued assessment and intervention until BP is < 140/6mm HG in hypertensive participants. < 130/20mm HG in hypertensive participants with diabetes, heart failure or chronic kidney disease.;Long Term: Maintenance of blood pressure at goal levels.    Lipids Yes    Intervention Provide education and support for  participant on nutrition & aerobic/resistive exercise along with prescribed medications to achieve LDL 70mg , HDL >40mg .    Expected Outcomes Short Term: Participant states understanding of desired cholesterol values and is compliant with medications prescribed. Participant is following exercise prescription and nutrition guidelines.;Long Term: Cholesterol controlled with medications as prescribed, with individualized exercise RX and with personalized nutrition plan. Value goals: LDL < 70mg , HDL > 40 mg.    Stress Yes    Intervention Offer individual and/or small group education and counseling on adjustment to heart disease, stress management and health-related lifestyle change. Teach and support self-help strategies.;Refer participants experiencing significant psychosocial distress to appropriate mental health specialists for further evaluation and treatment. When possible, include family members and significant others in education/counseling sessions.    Expected Outcomes Short Term: Participant demonstrates changes in health-related behavior, relaxation and other stress management skills, ability to obtain effective social support, and compliance with psychotropic medications if prescribed.;Long Term: Emotional wellbeing is indicated by absence of clinically significant psychosocial distress or social isolation.           Core Components/Risk Factors/Patient Goals Review:    Core Components/Risk Factors/Patient Goals at Discharge (Final Review):    ITP Comments:  ITP Comments    Row Name 10/01/19 0933           ITP Comments Dr Fransico Him MD, Medical Director              Comments:Imri attended orientation on 10/01/2019 to review rules and guidelines for program.  Completed 6 minute walk test, Intitial ITP, and exercise prescription.  VSS. Telemetry-Sinus Rhythm.  Asymptomatic. Safety measures and social distancing in place per CDC guidelines.Barnet Pall, RN,BSN 10/01/2019 12:28  PM

## 2019-10-05 ENCOUNTER — Other Ambulatory Visit: Payer: Self-pay

## 2019-10-05 ENCOUNTER — Encounter (HOSPITAL_COMMUNITY)
Admission: RE | Admit: 2019-10-05 | Discharge: 2019-10-05 | Disposition: A | Discharge status: Home / Self Care | Payer: Medicare Other | Source: Ambulatory Visit | Attending: Cardiovascular Disease | Admitting: Cardiovascular Disease

## 2019-10-05 DIAGNOSIS — Z955 Presence of coronary angioplasty implant and graft: Secondary | ICD-10-CM | POA: Insufficient documentation

## 2019-10-05 DIAGNOSIS — I10 Essential (primary) hypertension: Secondary | ICD-10-CM | POA: Insufficient documentation

## 2019-10-05 DIAGNOSIS — I2510 Atherosclerotic heart disease of native coronary artery without angina pectoris: Secondary | ICD-10-CM | POA: Insufficient documentation

## 2019-10-05 DIAGNOSIS — E785 Hyperlipidemia, unspecified: Secondary | ICD-10-CM | POA: Insufficient documentation

## 2019-10-05 DIAGNOSIS — Z79899 Other long term (current) drug therapy: Secondary | ICD-10-CM | POA: Diagnosis not present

## 2019-10-05 LAB — GLUCOSE, CAPILLARY
Glucose-Capillary: 171 mg/dL — ABNORMAL HIGH (ref 70–99)
Glucose-Capillary: 107 mg/dL — ABNORMAL HIGH (ref 70–99)

## 2019-10-05 NOTE — Progress Notes (Signed)
Cardiac Individual Treatment Plan  Patient Details  Name: Corey Cervantes MRN: [id number] Date of Birth: Jul 11, 1940 Referring Provider:     CARDIAC REHAB PHASE II ORIENTATION from 10/01/2019 in [place]  Referring Provider Sherren Mocha, MD      Initial Encounter Date:    CARDIAC REHAB PHASE II ORIENTATION from 10/01/2019 in [place]  Date 10/01/19      Visit Diagnosis: S/P DES mLAD 07/29/19  Patient's Home Medications on Admission:  Current Outpatient Medications:  .  ASHWAGANDHA PO, Take 1 tablet by mouth daily.  (Patient not taking: Reported on 10/01/2019), Disp: , Rfl:  .  aspirin EC 81 MG tablet, Take 1 tablet (81 mg total) by mouth daily., Disp: , Rfl:  .  b complex vitamins tablet, Take 1 tablet by mouth daily., Disp: , Rfl:  .  buPROPion (WELLBUTRIN) 100 MG tablet, Take 100 mg by mouth daily., Disp: , Rfl:  .  Cholecalciferol (VITAMIN D-3) 25 MCG (1000 UT) CAPS, Take 1,000 Units by mouth daily. , Disp: , Rfl:  .  CHONDROITIN SULFATE PO, Take 2 tablets by mouth daily. , Disp: , Rfl:  .  clopidogrel (PLAVIX) 75 MG tablet, Take 1 tablet (75 mg total) by mouth daily., Disp: 90 tablet, Rfl: 3 .  co-enzyme Q-10 30 MG capsule, Take 30 mg by mouth daily., Disp: , Rfl:  .  Evolocumab (REPATHA SURECLICK) 364 MG/ML SOAJ, Inject 1 pen into the skin every 14 (fourteen) days., Disp: 2 pen, Rfl: 11 .  hydrochlorothiazide (MICROZIDE) 12.5 MG capsule, TAKE 1 CAPSULE BY MOUTH EVERY DAY (Patient taking differently: Take 12.5 mg by mouth daily. ), Disp: 90 capsule, Rfl: 2 .  metFORMIN (GLUCOPHAGE) 500 MG tablet, Take 1 tablet (500 mg total) by mouth daily with breakfast., Disp: 90 tablet, Rfl: 1 .  metoprolol succinate (TOPROL-XL) 25 MG 24 hr tablet, Take 1 tablet (25 mg total) by mouth daily. Take with or immediately following a meal., Disp: 90 tablet, Rfl: 3 .  naphazoline-pheniramine (VISINE) 0.025-0.3 % ophthalmic solution, Place  1 drop into both eyes 4 (four) times daily as needed for eye irritation. Visine, Disp: , Rfl:  .  nitroGLYCERIN (NITROSTAT) 0.4 MG SL tablet, Place 1 tablet (0.4 mg total) under the tongue every 5 (five) minutes as needed for chest pain., Disp: 25 tablet, Rfl: 1 .  vitamin E 180 MG (400 UNITS) capsule, Take 400 Units by mouth daily., Disp: , Rfl:   Current Facility-Administered Medications:  .  0.9 %  sodium chloride infusion, 500 mL, Intravenous, Continuous, Gatha Mayer, MD  Past Medical History: Past Medical History:  Diagnosis Date  . Allergy   . Arthritis   . CAD (coronary artery disease) 07/29/2019   DES to LAD  . Cancer [facility]) many years ago    melanoma  . Hx of adenomatous colonic polyps 08/30/2016  . Hyperlipidemia   . Hypertension     Tobacco Use: Social History   Tobacco Use  Smoking Status Former Smoker  . Years: 15.00  . Types: Cigarettes, Pipe, Cigars  . Quit date: 02/26/1981  . Years since quitting: 38.6  Smokeless Tobacco Never Used    Labs: Recent Chemical engineer    Labs for ITP Cardiac and Pulmonary Rehab Latest Ref Rng & Units 03/11/2017 03/07/2018 09/01/2018 03/11/2019 08/20/2019   Cholestrol 100 - 199 mg/dL 250(H) 221(H) 238(H) 232(H) -   LDLCALC 0 - 99 mg/dL 178(H) 157(H) 164(H) 156(H) -  HDL >39 mg/dL 59 47 56 53 -   Trlycerides 0 - 149 mg/dL 66 87 91 126 -   Hemoglobin A1c 4.8 - 5.6 % 6.0(H) 6.4(H) 6.2(H) 6.1(H) 6.3(H)      Capillary Blood Glucose: Lab Results  Component Value Date   GLUCAP 112 (H) 10/07/2019   GLUCAP 152 (H) 10/07/2019   GLUCAP 107 (H) 10/05/2019   GLUCAP 171 (H) 10/05/2019   GLUCAP 108 (H) 10/01/2019     Exercise Target Goals: Exercise Program Goal: Individual exercise prescription set using results from initial 6 min walk test and THRR while considering  patient's activity barriers and safety.   Exercise Prescription Goal: Initial exercise prescription builds to 30-45 minutes a day of aerobic activity, 2-3 days per  week.  Home exercise guidelines will be given to patient during program as part of exercise prescription that the participant will acknowledge.  Activity Barriers & Risk Stratification:  Activity Barriers & Cardiac Risk Stratification - 10/01/19 1152      Activity Barriers & Cardiac Risk Stratification   Activity Barriers Joint Problems;Balance Concerns   Right knee pain at times, Right shoulder   Cardiac Risk Stratification High           6 Minute Walk:  6 Minute Walk    Row Name 10/01/19 1149         6 Minute Walk   Phase Initial     Distance 1871 feet     Walk Time 6 minutes     # of Rest Breaks 0     MPH 2.37     METS 4.51     RPE 7     Perceived Dyspnea  0     VO2 Peak 15.79     Symptoms No     Resting HR 63 bpm     Resting BP 142/78     Resting Oxygen Saturation  98 %     Exercise Oxygen Saturation  during 6 min walk 97 %     Max Ex. HR 97 bpm     Max Ex. BP 140/72     2 Minute Post BP 128/66            Oxygen Initial Assessment:   Oxygen Re-Evaluation:   Oxygen Discharge (Final Oxygen Re-Evaluation):   Initial Exercise Prescription:  Initial Exercise Prescription - 10/01/19 1100      Date of Initial Exercise RX and Referring Provider   Date 10/01/19    Referring Provider Sherren Mocha, MD    Expected Discharge Date 11/27/19      Bike   Level 2    Minutes 15    METs 3      NuStep   Level 3    SPM 85    Minutes 15    METs 2.2      Prescription Details   Frequency (times per week) 3    Duration Progress to 30 minutes of continuous aerobic without signs/symptoms of physical distress      Intensity   THRR 40-80% of Max Heartrate 57-114    Ratings of Perceived Exertion 11-13    Perceived Dyspnea 0-4      Progression   Progression Continue progressive overload as per policy without signs/symptoms or physical distress.      Resistance Training   Training Prescription Yes    Weight 3 lbs    Reps 10-15           Perform  Capillary Blood Glucose checks as  needed.  Exercise Prescription Changes:   Exercise Prescription Changes    Row Name 10/05/19 0715             Response to Exercise   Blood Pressure (Admit) 156/74       Blood Pressure (Exercise) 170/76       Blood Pressure (Exit) 130/76       Heart Rate (Admit) 66 bpm       Heart Rate (Exercise) 104 bpm       Heart Rate (Exit) 63 bpm       Rating of Perceived Exertion (Exercise) 12       Symptoms none       Comments Off to a good start with exercise.        Duration Continue with 30 min of aerobic exercise without signs/symptoms of physical distress.       Intensity THRR unchanged         Progression   Progression Continue to progress workloads to maintain intensity without signs/symptoms of physical distress.       Average METs 3.5         Resistance Training   Training Prescription Yes       Weight 3 lbs       Reps 10-15       Time 10 Minutes         Bike   Level 2       Minutes 15       METs 3.7         NuStep   Level 3       SPM 85       Minutes 15       METs 3.4              Exercise Comments:   Exercise Comments    Row Name 10/05/19 0808           Exercise Comments Patient tolerated 1st session of exercise well without symptoms.              Exercise Goals and Review:   Exercise Goals    Row Name 10/01/19 1157             Exercise Goals   Increase Physical Activity Yes       Intervention Provide advice, education, support and counseling about physical activity/exercise needs.;Develop an individualized exercise prescription for aerobic and resistive training based on initial evaluation findings, risk stratification, comorbidities and participant's personal goals.       Expected Outcomes Short Term: Attend rehab on a regular basis to increase amount of physical activity.;Long Term: Add in home exercise to make exercise part of routine and to increase amount of physical activity.;Long Term: Exercising  regularly at least 3-5 days a week.       Increase Strength and Stamina Yes       Intervention Provide advice, education, support and counseling about physical activity/exercise needs.;Develop an individualized exercise prescription for aerobic and resistive training based on initial evaluation findings, risk stratification, comorbidities and participant's personal goals.       Expected Outcomes Short Term: Increase workloads from initial exercise prescription for resistance, speed, and METs.;Short Term: Perform resistance training exercises routinely during rehab and add in resistance training at home;Long Term: Improve cardiorespiratory fitness, muscular endurance and strength as measured by increased METs and functional capacity (6MWT)       Able to understand and use rate of perceived exertion (RPE) scale Yes  Intervention Provide education and explanation on how to use RPE scale       Expected Outcomes Short Term: Able to use RPE daily in rehab to express subjective intensity level;Long Term:  Able to use RPE to guide intensity level when exercising independently       Knowledge and understanding of Target Heart Rate Range (THRR) Yes       Intervention Provide education and explanation of THRR including how the numbers were predicted and where they are located for reference       Expected Outcomes Short Term: Able to state/look up THRR;Long Term: Able to use THRR to govern intensity when exercising independently       Able to check pulse independently Yes       Intervention Provide education and demonstration on how to check pulse in carotid and radial arteries.;Review the importance of being able to check your own pulse for safety during independent exercise       Expected Outcomes Short Term: Able to explain why pulse checking is important during independent exercise;Long Term: Able to check pulse independently and accurately       Understanding of Exercise Prescription Yes        Intervention Provide education, explanation, and written materials on patient's individual exercise prescription       Expected Outcomes Short Term: Able to explain program exercise prescription;Long Term: Able to explain home exercise prescription to exercise independently              Exercise Goals Re-Evaluation :  Exercise Goals Re-Evaluation    Row Name 10/05/19 0808             Exercise Goal Re-Evaluation   Exercise Goals Review Increase Physical Activity;Able to understand and use rate of perceived exertion (RPE) scale       Comments Patient able to understand and use RPE scale appropriately.       Expected Outcomes Progress workloads as tolerated to help improve cardiorespiratory fitness.              Discharge Exercise Prescription (Final Exercise Prescription Changes):  Exercise Prescription Changes - 10/05/19 0715      Response to Exercise   Blood Pressure (Admit) 156/74    Blood Pressure (Exercise) 170/76    Blood Pressure (Exit) 130/76    Heart Rate (Admit) 66 bpm    Heart Rate (Exercise) 104 bpm    Heart Rate (Exit) 63 bpm    Rating of Perceived Exertion (Exercise) 12    Symptoms none    Comments Off to a good start with exercise.     Duration Continue with 30 min of aerobic exercise without signs/symptoms of physical distress.    Intensity THRR unchanged      Progression   Progression Continue to progress workloads to maintain intensity without signs/symptoms of physical distress.    Average METs 3.5      Resistance Training   Training Prescription Yes    Weight 3 lbs    Reps 10-15    Time 10 Minutes      Bike   Level 2    Minutes 15    METs 3.7      NuStep   Level 3    SPM 85    Minutes 15    METs 3.4           Nutrition:  Target Goals: Understanding of nutrition guidelines, daily intake of sodium 1500mg , cholesterol 200mg , calories 30% from fat and 7%  or less from saturated fats, daily to have 5 or more servings of fruits and  vegetables.  Biometrics:  Pre Biometrics - 10/01/19 0845      Pre Biometrics   Waist Circumference 42 inches    Hip Circumference 43.25 inches    Waist to Hip Ratio 0.97 %    Triceps Skinfold 22 mm    % Body Fat 30.4 %    Grip Strength 38 kg    Flexibility 13.75 in    Single Leg Stand 3.68 seconds   High fall Risk           Nutrition Therapy Plan and Nutrition Goals:   Nutrition Assessments:   Nutrition Goals Re-Evaluation:   Nutrition Goals Re-Evaluation:   Nutrition Goals Discharge (Final Nutrition Goals Re-Evaluation):   Psychosocial: Target Goals: Acknowledge presence or absence of significant depression and/or stress, maximize coping skills, provide positive support system. Participant is able to verbalize types and ability to use techniques and skills needed for reducing stress and depression.  Initial Review & Psychosocial Screening:  Initial Psych Review & Screening - 10/01/19 0935      Initial Review   Current issues with Current Stress Concerns    Source of Stress Concerns Family    Comments Arran's son had a Mi with cardiac arrest on memorial day right before his hospital admission. Hawke is still concerned about his health      [place]? Yes   Rishab has his wife and children for support   Comments Will offer emotional support as needed. Kaimana says his son is doing better now      Barriers   Psychosocial barriers to participate in program The patient should benefit from training in stress management and relaxation.      Screening Interventions   Interventions Encouraged to exercise    Expected Outcomes Long Term Goal: Stressors or current issues are controlled or eliminated.           Quality of Life Scores:  Quality of Life - 10/01/19 1141      Quality of Life   Select Quality of Life      Quality of Life Scores   Health/Function Pre 27.07 %    Socioeconomic Pre 25.81 %    Psych/Spiritual Pre 25.5 %     Family Pre 28.8 %    GLOBAL Pre 26.71 %          Scores of 19 and below usually indicate a poorer quality of life in these areas.  A difference of  2-3 points is a clinically meaningful difference.  A difference of 2-3 points in the total score of the Quality of Life Index has been associated with significant improvement in overall quality of life, self-image, physical symptoms, and general health in studies assessing change in quality of life.  PHQ-9: Recent Review Flowsheet Data    Depression screen [facility] 2/9 10/01/2019 08/20/2019 03/11/2019 10/09/2018 09/01/2018   Decreased Interest 0 0 0 0 0   Down, Depressed, Hopeless 0 0 0 0 0   PHQ - 2 Score 0 0 0 0 0     Interpretation of Total Score  Total Score Depression Severity:  1-4 = Minimal depression, 5-9 = Mild depression, 10-14 = Moderate depression, 15-19 = Moderately severe depression, 20-27 = Severe depression   Psychosocial Evaluation and Intervention:  Psychosocial Evaluation - 10/05/19 1250      Psychosocial Evaluation & Interventions   Interventions Encouraged to exercise with  the program and follow exercise prescription;Stress management education;Relaxation education    Comments Mr. Mimbs admits to being concerned over his son's health. States his son had a cardiac arrest event recently. Mr. Trevino does present to CR with a positive attitude and outlook for himself. He feels his stress secondary to his son's health is managable and no interventions are needed. Mr. Kwan enjoys painting and writing and utilizes these hobbies to manage his stress.    Expected Outcomes Mr. Cheuvront will continue to have a positive attitude and outlook. He will utilize his support system and openly discuss his worries regarding his son's health with his family    Continue Psychosocial Services  Follow up required by staff           Psychosocial Re-Evaluation:   Psychosocial Discharge (Final Psychosocial Re-Evaluation):   Vocational  Rehabilitation: Provide vocational rehab assistance to qualifying candidates.   Vocational Rehab Evaluation & Intervention:  Vocational Rehab - 10/01/19 0935      Initial Vocational Rehab Evaluation & Intervention   Assessment shows need for Vocational Rehabilitation No   Elven is currently working and does not need vocational rehab at this time.          Education: Education Goals: Education classes will be provided on a weekly basis, covering required topics. Participant will state understanding/return demonstration of topics presented.  Learning Barriers/Preferences:  Learning Barriers/Preferences - 10/01/19 1142      Learning Barriers/Preferences   Learning Barriers Sight;Hearing   Wears glasses, HOH   Learning Preferences Computer/Internet;Pictoral;Skilled Demonstration;Video           Education Topics: Count Your Pulse:  -Group instruction provided by verbal instruction, demonstration, patient participation and written materials to support subject.  Instructors address importance of being able to find your pulse and how to count your pulse when at home without a heart monitor.  Patients get hands on experience counting their pulse with staff help and individually.   Heart Attack, Angina, and Risk Factor Modification:  -Group instruction provided by verbal instruction, video, and written materials to support subject.  Instructors address signs and symptoms of angina and heart attacks.    Also discuss risk factors for heart disease and how to make changes to improve heart health risk factors.   Functional Fitness:  -Group instruction provided by verbal instruction, demonstration, patient participation, and written materials to support subject.  Instructors address safety measures for doing things around the house.  Discuss how to get up and down off the floor, how to pick things up properly, how to safely get out of a chair without assistance, and balance  training.   Meditation and Mindfulness:  -Group instruction provided by verbal instruction, patient participation, and written materials to support subject.  Instructor addresses importance of mindfulness and meditation practice to help reduce stress and improve awareness.  Instructor also leads participants through a meditation exercise.    Stretching for Flexibility and Mobility:  -Group instruction provided by verbal instruction, patient participation, and written materials to support subject.  Instructors lead participants through series of stretches that are designed to increase flexibility thus improving mobility.  These stretches are additional exercise for major muscle groups that are typically performed during regular warm up and cool down.   Hands Only CPR:  -Group verbal, video, and participation provides a basic overview of AHA guidelines for community CPR. Role-play of emergencies allow participants the opportunity to practice calling for help and chest compression technique with discussion of AED use.  Hypertension: -Group verbal and written instruction that provides a basic overview of hypertension including the most recent diagnostic guidelines, risk factor reduction with self-care instructions and medication management.    Nutrition I class: Heart Healthy Eating:  -Group instruction provided by PowerPoint slides, verbal discussion, and written materials to support subject matter. The instructor gives an explanation and review of the Therapeutic Lifestyle Changes diet recommendations, which includes a discussion on lipid goals, dietary fat, sodium, fiber, plant stanol/sterol esters, sugar, and the components of a well-balanced, healthy diet.   Nutrition II class: Lifestyle Skills:  -Group instruction provided by PowerPoint slides, verbal discussion, and written materials to support subject matter. The instructor gives an explanation and review of label reading, grocery  shopping for heart health, heart healthy recipe modifications, and ways to make healthier choices when eating out.   Diabetes Question & Answer:  -Group instruction provided by PowerPoint slides, verbal discussion, and written materials to support subject matter. The instructor gives an explanation and review of diabetes co-morbidities, pre- and post-prandial blood glucose goals, pre-exercise blood glucose goals, signs, symptoms, and treatment of hypoglycemia and hyperglycemia, and foot care basics.   Diabetes Blitz:  -Group instruction provided by PowerPoint slides, verbal discussion, and written materials to support subject matter. The instructor gives an explanation and review of the physiology behind type 1 and type 2 diabetes, diabetes medications and rational behind using different medications, pre- and post-prandial blood glucose recommendations and Hemoglobin A1c goals, diabetes diet, and exercise including blood glucose guidelines for exercising safely.    Portion Distortion:  -Group instruction provided by PowerPoint slides, verbal discussion, written materials, and food models to support subject matter. The instructor gives an explanation of serving size versus portion size, changes in portions sizes over the last 20 years, and what consists of a serving from each food group.   Stress Management:  -Group instruction provided by verbal instruction, video, and written materials to support subject matter.  Instructors review role of stress in heart disease and how to cope with stress positively.     Exercising on Your Own:  -Group instruction provided by verbal instruction, power point, and written materials to support subject.  Instructors discuss benefits of exercise, components of exercise, frequency and intensity of exercise, and end points for exercise.  Also discuss use of nitroglycerin and activating EMS.  Review options of places to exercise outside of rehab.  Review guidelines  for sex with heart disease.   Cardiac Drugs I:  -Group instruction provided by verbal instruction and written materials to support subject.  Instructor reviews cardiac drug classes: antiplatelets, anticoagulants, beta blockers, and statins.  Instructor discusses reasons, side effects, and lifestyle considerations for each drug class.   Cardiac Drugs II:  -Group instruction provided by verbal instruction and written materials to support subject.  Instructor reviews cardiac drug classes: angiotensin converting enzyme inhibitors (ACE-I), angiotensin II receptor blockers (ARBs), nitrates, and calcium channel blockers.  Instructor discusses reasons, side effects, and lifestyle considerations for each drug class.   Anatomy and Physiology of the Circulatory System:  Group verbal and written instruction and models provide basic cardiac anatomy and physiology, with the coronary electrical and arterial systems. Review of: AMI, Angina, Valve disease, Heart Failure, Peripheral Artery Disease, Cardiac Arrhythmia, Pacemakers, and the ICD.   Other Education:  -Group or individual verbal, written, or video instructions that support the educational goals of the cardiac rehab program.   Holiday Eating Survival Tips:  -Group instruction provided by PowerPoint slides, verbal discussion,  and written materials to support subject matter. The instructor gives patients tips, tricks, and techniques to help them not only survive but enjoy the holidays despite the onslaught of food that accompanies the holidays.   Knowledge Questionnaire Score:  Knowledge Questionnaire Score - 10/01/19 1146      Knowledge Questionnaire Score   Pre Score 22/24           Core Components/Risk Factors/Patient Goals at Admission:  Personal Goals and Risk Factors at Admission - 10/01/19 1146      Core Components/Risk Factors/Patient Goals on Admission    Weight Management Yes;Weight Maintenance    Admit Weight 195 lb 5.2 oz  (88.6 kg)    Expected Outcomes Short Term: Continue to assess and modify interventions until short term weight is achieved;Long Term: Adherence to nutrition and physical activity/exercise program aimed toward attainment of established weight goal;Weight Maintenance: Understanding of the daily nutrition guidelines, which includes 25-35% calories from fat, 7% or less cal from saturated fats, less than 200mg  cholesterol, less than 1.5gm of sodium, & 5 or more servings of fruits and vegetables daily;Understanding recommendations for meals to include 15-35% energy as protein, 25-35% energy from fat, 35-60% energy from carbohydrates, less than 200mg  of dietary cholesterol, 20-35 gm of total fiber daily;Understanding of distribution of calorie intake throughout the day with the consumption of 4-5 meals/snacks    Diabetes Yes    Intervention Provide education about signs/symptoms and action to take for hypo/hyperglycemia.;Provide education about proper nutrition, including hydration, and aerobic/resistive exercise prescription along with prescribed medications to achieve blood glucose in normal ranges: Fasting glucose 65-99 mg/dL    Expected Outcomes Short Term: Participant verbalizes understanding of the signs/symptoms and immediate care of hyper/hypoglycemia, proper foot care and importance of medication, aerobic/resistive exercise and nutrition plan for blood glucose control.;Long Term: Attainment of HbA1C < 7%.    Hypertension Yes    Intervention Provide education on lifestyle modifcations including regular physical activity/exercise, weight management, moderate sodium restriction and increased consumption of fresh fruit, vegetables, and low fat dairy, alcohol moderation, and smoking cessation.;Monitor prescription use compliance.    Expected Outcomes Short Term: Continued assessment and intervention until BP is < 140/53mm HG in hypertensive participants. < 130/14mm HG in hypertensive participants with diabetes,  heart failure or chronic kidney disease.;Long Term: Maintenance of blood pressure at goal levels.    Lipids Yes    Intervention Provide education and support for participant on nutrition & aerobic/resistive exercise along with prescribed medications to achieve LDL 70mg , HDL >40mg .    Expected Outcomes Short Term: Participant states understanding of desired cholesterol values and is compliant with medications prescribed. Participant is following exercise prescription and nutrition guidelines.;Long Term: Cholesterol controlled with medications as prescribed, with individualized exercise RX and with personalized nutrition plan. Value goals: LDL < 70mg , HDL > 40 mg.    Stress Yes    Intervention Offer individual and/or small group education and counseling on adjustment to heart disease, stress management and health-related lifestyle change. Teach and support self-help strategies.;Refer participants experiencing significant psychosocial distress to appropriate mental health specialists for further evaluation and treatment. When possible, include family members and significant others in education/counseling sessions.    Expected Outcomes Short Term: Participant demonstrates changes in health-related behavior, relaxation and other stress management skills, ability to obtain effective social support, and compliance with psychotropic medications if prescribed.;Long Term: Emotional wellbeing is indicated by absence of clinically significant psychosocial distress or social isolation.  Core Components/Risk Factors/Patient Goals Review:   Goals and Risk Factor Review    Row Name 10/05/19 1254             Core Components/Risk Factors/Patient Goals Review   Personal Goals Review Weight Management/Obesity;Lipids;Diabetes;Stress;Hypertension       Review Mr. Manasco has multiple CAD risk factors. He is eager to participate in CR for modification. His goals are to increase his stamina and strength and  sustain an exercise program post graduation from CR. Will continue to monitor progress towards goals over the next 30 days.       Expected Outcomes Mr. Goyne will continue to participate in CR for risk factor modification.              Core Components/Risk Factors/Patient Goals at Discharge (Final Review):   Goals and Risk Factor Review - 10/05/19 1254      Core Components/Risk Factors/Patient Goals Review   Personal Goals Review Weight Management/Obesity;Lipids;Diabetes;Stress;Hypertension    Review Mr. Benak has multiple CAD risk factors. He is eager to participate in CR for modification. His goals are to increase his stamina and strength and sustain an exercise program post graduation from CR. Will continue to monitor progress towards goals over the next 30 days.    Expected Outcomes Mr. Branham will continue to participate in CR for risk factor modification.           ITP Comments:  ITP Comments    Row Name 10/01/19 0933 10/05/19 1248         ITP Comments Dr Fransico Him MD, Medical Director 30 day ITP review: Mr. Lofgren completed his first cardiac rehab exercise session today and tolerated well. VSS. Max exertional BP 170/76 however patient states it has only been 20 min since he took his BP medications. He is instructed to take his BP medications 60 min prior to exercise. Denied complaints. Worked to an RPE of 12. Eager to participate in CR. Will continue to monitor patient progression towards goals over the next 30 days.             Comments: see ITP comments

## 2019-10-05 NOTE — Progress Notes (Signed)
Daily Session Note  Patient Details  Name: Corey Cervantes MRN: [id number] Date of Birth: 09/13/1940 Referring Provider:     [place] from 10/01/2019 in [place]  Referring Provider Sherren Mocha, MD      Encounter Date: 10/05/2019  Check In:   Capillary Blood Glucose: No results found for this or any previous visit (from the past 24 hour(s)).    Social History   Tobacco Use  Smoking Status Former Smoker   Years: 15.00   Types: Cigarettes, Pipe, Cigars   Quit date: 02/26/1981   Years since quitting: 38.6  Smokeless Tobacco Never Used    Goals Met:  Exercise tolerated well Personal goals reviewed No report of cardiac concerns or symptoms Strength training completed today  Goals Unmet:  Not Applicable  Comments: Pt started cardiac rehab today.  Pt tolerated light exercise without difficulty. VSS, telemetry-NSR, asymptomatic.  Medication list reconciled. Pt denies barriers to medicaiton compliance.  PSYCHOSOCIAL ASSESSMENT:  PHQ-0. Pt exhibits positive coping skills, hopeful outlook with supportive family. No psychosocial needs identified at this time, no psychosocial interventions necessary. Pt oriented to exercise equipment and routine. Understanding verbalized.   Dr. Fransico Him is Medical Director for Cardiac Rehab at [facility].

## 2019-10-07 ENCOUNTER — Other Ambulatory Visit: Payer: Self-pay

## 2019-10-07 ENCOUNTER — Encounter (HOSPITAL_COMMUNITY)
Admission: RE | Admit: 2019-10-07 | Discharge: 2019-10-07 | Disposition: A | Discharge status: Home / Self Care | Payer: Medicare Other | Source: Ambulatory Visit | Attending: Cardiovascular Disease | Admitting: Cardiovascular Disease

## 2019-10-07 DIAGNOSIS — I10 Essential (primary) hypertension: Secondary | ICD-10-CM | POA: Diagnosis not present

## 2019-10-07 DIAGNOSIS — Z79899 Other long term (current) drug therapy: Secondary | ICD-10-CM | POA: Diagnosis not present

## 2019-10-07 DIAGNOSIS — I2510 Atherosclerotic heart disease of native coronary artery without angina pectoris: Secondary | ICD-10-CM | POA: Diagnosis not present

## 2019-10-07 DIAGNOSIS — Z955 Presence of coronary angioplasty implant and graft: Secondary | ICD-10-CM | POA: Diagnosis not present

## 2019-10-07 DIAGNOSIS — E785 Hyperlipidemia, unspecified: Secondary | ICD-10-CM | POA: Diagnosis not present

## 2019-10-07 LAB — GLUCOSE, CAPILLARY
Glucose-Capillary: 152 mg/dL — ABNORMAL HIGH (ref 70–99)
Glucose-Capillary: 112 mg/dL — ABNORMAL HIGH (ref 70–99)

## 2019-10-09 ENCOUNTER — Other Ambulatory Visit: Payer: Self-pay

## 2019-10-09 ENCOUNTER — Encounter (HOSPITAL_COMMUNITY)
Admission: RE | Admit: 2019-10-09 | Discharge: 2019-10-09 | Disposition: A | Discharge status: Home / Self Care | Payer: Medicare Other | Source: Ambulatory Visit | Attending: Cardiovascular Disease | Admitting: Cardiovascular Disease

## 2019-10-09 DIAGNOSIS — Z955 Presence of coronary angioplasty implant and graft: Secondary | ICD-10-CM

## 2019-10-09 NOTE — Progress Notes (Signed)
Incomplete Session Note  Patient Details  Name: Corey Cervantes MRN: [id number] Date of Birth: 11/03/1940 Referring Provider:     [place] from 10/01/2019 in [place]  Referring Provider Sherren Mocha, MD      [name] did not complete his rehab session. [name] presents to his 3rd cardiac rehab session admitting to not eating since his evening meal yesterday. He is a type 2 diabetic and takes metformin 500mg  daily. Per protocol, [name] is not allowed to exercise without nourishment. He is provided education and handouts on importants of eating prior to exercise and examples of foods sufficient in protein and carbohydrates. Patient verbalized understanding and appreciative of education. Patient will return to cardiac rehab exercise on Monday.  Tyniah Kastens E. Rollene Rotunda RN, BSN [place]. [facility]  Cardiac and Pulmonary Rehabilitation Phone: [phone] Fax: [phone]

## 2019-10-12 ENCOUNTER — Encounter (HOSPITAL_COMMUNITY)
Admission: RE | Admit: 2019-10-12 | Discharge: 2019-10-12 | Disposition: A | Discharge status: Home / Self Care | Payer: Medicare Other | Source: Ambulatory Visit | Attending: Cardiovascular Disease | Admitting: Cardiovascular Disease

## 2019-10-12 ENCOUNTER — Other Ambulatory Visit: Payer: Self-pay

## 2019-10-12 DIAGNOSIS — I2510 Atherosclerotic heart disease of native coronary artery without angina pectoris: Secondary | ICD-10-CM | POA: Diagnosis not present

## 2019-10-12 DIAGNOSIS — Z955 Presence of coronary angioplasty implant and graft: Secondary | ICD-10-CM | POA: Diagnosis not present

## 2019-10-12 DIAGNOSIS — I10 Essential (primary) hypertension: Secondary | ICD-10-CM | POA: Diagnosis not present

## 2019-10-12 DIAGNOSIS — Z79899 Other long term (current) drug therapy: Secondary | ICD-10-CM | POA: Diagnosis not present

## 2019-10-12 DIAGNOSIS — E785 Hyperlipidemia, unspecified: Secondary | ICD-10-CM | POA: Diagnosis not present

## 2019-10-12 LAB — GLUCOSE, CAPILLARY
Glucose-Capillary: 119 mg/dL — ABNORMAL HIGH (ref 70–99)
Glucose-Capillary: 105 mg/dL — ABNORMAL HIGH (ref 70–99)

## 2019-10-12 NOTE — Progress Notes (Signed)
Corey Cervantes 79 y.o. male Nutrition Note  Visit Diagnosis: S/P DES mLAD 07/29/19  Past Medical History:  Diagnosis Date   Allergy    Arthritis    CAD (coronary artery disease) 07/29/2019   DES to LAD   Cancer [facility]) many years ago    melanoma   Hx of adenomatous colonic polyps 08/30/2016   Hyperlipidemia    Hypertension      Medications reviewed.   Current Outpatient Medications:    ASHWAGANDHA PO, Take 1 tablet by mouth daily.  (Patient not taking: Reported on 10/01/2019), Disp: , Rfl:    aspirin EC 81 MG tablet, Take 1 tablet (81 mg total) by mouth daily., Disp: , Rfl:    b complex vitamins tablet, Take 1 tablet by mouth daily., Disp: , Rfl:    buPROPion (WELLBUTRIN) 100 MG tablet, Take 100 mg by mouth daily., Disp: , Rfl:    Cholecalciferol (VITAMIN D-3) 25 MCG (1000 UT) CAPS, Take 1,000 Units by mouth daily. , Disp: , Rfl:    CHONDROITIN SULFATE PO, Take 2 tablets by mouth daily. , Disp: , Rfl:    clopidogrel (PLAVIX) 75 MG tablet, Take 1 tablet (75 mg total) by mouth daily., Disp: 90 tablet, Rfl: 3   co-enzyme Q-10 30 MG capsule, Take 30 mg by mouth daily., Disp: , Rfl:    Evolocumab (REPATHA SURECLICK) 759 MG/ML SOAJ, Inject 1 pen into the skin every 14 (fourteen) days., Disp: 2 pen, Rfl: 11   hydrochlorothiazide (MICROZIDE) 12.5 MG capsule, TAKE 1 CAPSULE BY MOUTH EVERY DAY (Patient taking differently: Take 12.5 mg by mouth daily. ), Disp: 90 capsule, Rfl: 2   metFORMIN (GLUCOPHAGE) 500 MG tablet, Take 1 tablet (500 mg total) by mouth daily with breakfast., Disp: 90 tablet, Rfl: 1   metoprolol succinate (TOPROL-XL) 25 MG 24 hr tablet, Take 1 tablet (25 mg total) by mouth daily. Take with or immediately following a meal., Disp: 90 tablet, Rfl: 3   naphazoline-pheniramine (VISINE) 0.025-0.3 % ophthalmic solution, Place 1 drop into both eyes 4 (four) times daily as needed for eye irritation. Visine, Disp: , Rfl:    nitroGLYCERIN (NITROSTAT) 0.4 MG SL tablet,  Place 1 tablet (0.4 mg total) under the tongue every 5 (five) minutes as needed for chest pain., Disp: 25 tablet, Rfl: 1   vitamin E 180 MG (400 UNITS) capsule, Take 400 Units by mouth daily., Disp: , Rfl:   Current Facility-Administered Medications:    0.9 %  sodium chloride infusion, 500 mL, Intravenous, Continuous, Gatha Mayer, MD   Ht Readings from Last 1 Encounters:  10/01/19 5' 9.75" (1.772 m)     Wt Readings from Last 3 Encounters:  10/01/19 195 lb 5.2 oz (88.6 kg)  09/07/19 196 lb (88.9 kg)  08/20/19 196 lb (88.9 kg)     There is no height or weight on file to calculate BMI.   Social History   Tobacco Use  Smoking Status Former Smoker   Years: 15.00   Types: Cigarettes, Pipe, Cigars   Quit date: 02/26/1981   Years since quitting: 38.6  Smokeless Tobacco Never Used     Lab Results  Component Value Date   CHOL 232 (H) 03/11/2019   Lab Results  Component Value Date   HDL 53 03/11/2019   Lab Results  Component Value Date   LDLCALC 156 (H) 03/11/2019   Lab Results  Component Value Date   TRIG 126 03/11/2019     Lab Results  Component Value Date  HGBA1C 6.3 (H) 08/20/2019     CBG (last 3)  Recent Labs    10/12/19 0801  GLUCAP 105*     Nutrition Note  Spoke with pt. Nutrition Plan and Nutrition Survey goals reviewed with pt. Pt is following a Heart Healthy diet.   Pt has Pre-diabetes. Last A1c indicates blood glucose well-controlled. He recently started on metformin. He does not check CBGs at home. He is interested in learning more about diet and diabetes.  Per discussion, pt does not use canned/convenience foods often. Pt does not add salt to food. Pt does not eat out frequently. No sugary beverages. He does read labels. He used to drink alcohol daily as he worked in the [employer]. He has tried to cut back and tries to limit <2 drinks per day. He does not drink daily anymore. He speed walks 3-4 days per week, a few miles each time.  He  states these diet changes are new. Prior to PCI he was eating more pasta meals, alcohol, and "induldgent" foods.He has been mindful to reduce portions and limit these types of meals to 1-2x/week. Pt expressed understanding of the information reviewed.    Nutrition Diagnosis ? Excessive carbohydrate intake related to food preferences and lack of food related knowledge as evidenced by a1c 6.3 and initiation of metformin  Nutrition Intervention ? Pts individual nutrition plan reviewed with pt. ? Benefits of adopting Heart Healthy diet discussed when Medficts reviewed. ? Continue client-centered nutrition education by RD, as part of interdisciplinary care.  Goal(s) ? Pt to build a healthy plate including vegetables, fruits, whole grains, and low-fat dairy products in a heart healthy meal plan. ? Pt able to name foods that affect blood glucose. ? Pt to make food choices to reduce A1C to less than 5.7 and out of prediabetes range   Plan:   Will provide client-centered nutrition education as part of interdisciplinary care  Monitor and evaluate progress toward nutrition goal with team.   Michaele Offer, MS, RDN, LDN

## 2019-10-14 ENCOUNTER — Encounter (HOSPITAL_COMMUNITY)
Admission: RE | Admit: 2019-10-14 | Discharge: 2019-10-14 | Disposition: A | Discharge status: Home / Self Care | Payer: Medicare Other | Source: Ambulatory Visit | Attending: Cardiovascular Disease | Admitting: Cardiovascular Disease

## 2019-10-14 ENCOUNTER — Other Ambulatory Visit: Payer: Self-pay

## 2019-10-14 DIAGNOSIS — Z955 Presence of coronary angioplasty implant and graft: Secondary | ICD-10-CM | POA: Diagnosis not present

## 2019-10-14 DIAGNOSIS — I10 Essential (primary) hypertension: Secondary | ICD-10-CM | POA: Diagnosis not present

## 2019-10-14 DIAGNOSIS — I2510 Atherosclerotic heart disease of native coronary artery without angina pectoris: Secondary | ICD-10-CM | POA: Diagnosis not present

## 2019-10-14 DIAGNOSIS — Z79899 Other long term (current) drug therapy: Secondary | ICD-10-CM | POA: Diagnosis not present

## 2019-10-14 DIAGNOSIS — E785 Hyperlipidemia, unspecified: Secondary | ICD-10-CM | POA: Diagnosis not present

## 2019-10-14 LAB — GLUCOSE, CAPILLARY: Glucose-Capillary: 114 mg/dL — ABNORMAL HIGH (ref 70–99)

## 2019-10-16 ENCOUNTER — Other Ambulatory Visit: Payer: Self-pay

## 2019-10-16 ENCOUNTER — Encounter (HOSPITAL_COMMUNITY)
Admission: RE | Admit: 2019-10-16 | Discharge: 2019-10-16 | Disposition: A | Discharge status: Home / Self Care | Payer: Medicare Other | Source: Ambulatory Visit | Attending: Cardiovascular Disease | Admitting: Cardiovascular Disease

## 2019-10-16 DIAGNOSIS — E785 Hyperlipidemia, unspecified: Secondary | ICD-10-CM | POA: Diagnosis not present

## 2019-10-16 DIAGNOSIS — I2510 Atherosclerotic heart disease of native coronary artery without angina pectoris: Secondary | ICD-10-CM | POA: Diagnosis not present

## 2019-10-16 DIAGNOSIS — Z955 Presence of coronary angioplasty implant and graft: Secondary | ICD-10-CM

## 2019-10-16 DIAGNOSIS — Z79899 Other long term (current) drug therapy: Secondary | ICD-10-CM | POA: Diagnosis not present

## 2019-10-16 DIAGNOSIS — I10 Essential (primary) hypertension: Secondary | ICD-10-CM | POA: Diagnosis not present

## 2019-10-19 ENCOUNTER — Encounter (HOSPITAL_COMMUNITY)
Admission: RE | Admit: 2019-10-19 | Discharge: 2019-10-19 | Disposition: A | Discharge status: Home / Self Care | Payer: Medicare Other | Source: Ambulatory Visit | Attending: Cardiovascular Disease | Admitting: Cardiovascular Disease

## 2019-10-19 ENCOUNTER — Other Ambulatory Visit: Payer: Self-pay

## 2019-10-19 DIAGNOSIS — Z79899 Other long term (current) drug therapy: Secondary | ICD-10-CM | POA: Diagnosis not present

## 2019-10-19 DIAGNOSIS — Z955 Presence of coronary angioplasty implant and graft: Secondary | ICD-10-CM | POA: Diagnosis not present

## 2019-10-19 DIAGNOSIS — I2510 Atherosclerotic heart disease of native coronary artery without angina pectoris: Secondary | ICD-10-CM | POA: Diagnosis not present

## 2019-10-19 DIAGNOSIS — I10 Essential (primary) hypertension: Secondary | ICD-10-CM | POA: Diagnosis not present

## 2019-10-19 DIAGNOSIS — E785 Hyperlipidemia, unspecified: Secondary | ICD-10-CM | POA: Diagnosis not present

## 2019-10-19 NOTE — Progress Notes (Signed)
Reviewed home exercise guidelines with patient including endpoints, temperature precautions, target heart rate and rate of perceived exertion. Pt is walking 2.5 miles, 35-40 minutes 3-4 days/week as his mode of home exercise. Pt also has a stationary bike at home that he rides when weather doesn't permit walking outdoors. Pt has 5 lb. weights that he uses for his resistance training. Pt voices understanding of instructions given.  Sol Passer, MS, ACSM CEP

## 2019-10-20 ENCOUNTER — Telehealth: Payer: Self-pay | Admitting: Cardiovascular Disease

## 2019-10-20 NOTE — Telephone Encounter (Signed)
1. What dental office are you calling from? [facility]  2. What is your office phone number? [phone]  3. What is your fax number?[phone]  4. What type of procedure is the patient having performed? Cleaning  5. What date is procedure scheduled or is the patient there now? Patient is in the chair now (if the patient is at the dentist's office question goes to their cardiologist if he/she is in the office.  If not, question should go to the DOD).   6. What is your question (ex. Antibiotics prior to procedure, holding medication-we need to know how long dentist wants pt to hold med)? Dentist office needs to know if the patient is cleared to have a cleaning. He just had a stent 07/29/19

## 2019-10-20 NOTE — Telephone Encounter (Signed)
Spoke with DOD, Dr. Radford Pax and since the patient is more than 4 weeks out from stent then he does not need pre-meds and is cleared to have a routine cleaning at this time. Will fax over letter per request.

## 2019-10-21 ENCOUNTER — Other Ambulatory Visit: Payer: Self-pay

## 2019-10-21 ENCOUNTER — Encounter (HOSPITAL_COMMUNITY)
Admission: RE | Admit: 2019-10-21 | Discharge: 2019-10-21 | Disposition: A | Discharge status: Home / Self Care | Payer: Medicare Other | Source: Ambulatory Visit | Attending: Cardiovascular Disease | Admitting: Cardiovascular Disease

## 2019-10-21 DIAGNOSIS — Z955 Presence of coronary angioplasty implant and graft: Secondary | ICD-10-CM | POA: Diagnosis not present

## 2019-10-21 DIAGNOSIS — I2510 Atherosclerotic heart disease of native coronary artery without angina pectoris: Secondary | ICD-10-CM | POA: Diagnosis not present

## 2019-10-21 DIAGNOSIS — I10 Essential (primary) hypertension: Secondary | ICD-10-CM | POA: Diagnosis not present

## 2019-10-21 DIAGNOSIS — E785 Hyperlipidemia, unspecified: Secondary | ICD-10-CM | POA: Diagnosis not present

## 2019-10-21 DIAGNOSIS — Z79899 Other long term (current) drug therapy: Secondary | ICD-10-CM | POA: Diagnosis not present

## 2019-10-23 ENCOUNTER — Other Ambulatory Visit: Payer: Self-pay

## 2019-10-23 ENCOUNTER — Encounter (HOSPITAL_COMMUNITY)
Admission: RE | Admit: 2019-10-23 | Discharge: 2019-10-23 | Disposition: A | Discharge status: Home / Self Care | Payer: Medicare Other | Source: Ambulatory Visit | Attending: Cardiovascular Disease | Admitting: Cardiovascular Disease

## 2019-10-23 DIAGNOSIS — Z955 Presence of coronary angioplasty implant and graft: Secondary | ICD-10-CM | POA: Diagnosis not present

## 2019-10-23 DIAGNOSIS — I2510 Atherosclerotic heart disease of native coronary artery without angina pectoris: Secondary | ICD-10-CM | POA: Diagnosis not present

## 2019-10-23 DIAGNOSIS — Z79899 Other long term (current) drug therapy: Secondary | ICD-10-CM | POA: Diagnosis not present

## 2019-10-23 DIAGNOSIS — I10 Essential (primary) hypertension: Secondary | ICD-10-CM | POA: Diagnosis not present

## 2019-10-23 DIAGNOSIS — E785 Hyperlipidemia, unspecified: Secondary | ICD-10-CM | POA: Diagnosis not present

## 2019-10-26 ENCOUNTER — Encounter (HOSPITAL_COMMUNITY)
Admission: RE | Admit: 2019-10-26 | Discharge: 2019-10-26 | Disposition: A | Discharge status: Home / Self Care | Payer: Medicare Other | Source: Ambulatory Visit | Attending: Cardiovascular Disease | Admitting: Cardiovascular Disease

## 2019-10-26 ENCOUNTER — Other Ambulatory Visit: Payer: Self-pay

## 2019-10-26 DIAGNOSIS — Z955 Presence of coronary angioplasty implant and graft: Secondary | ICD-10-CM

## 2019-10-26 DIAGNOSIS — I2510 Atherosclerotic heart disease of native coronary artery without angina pectoris: Secondary | ICD-10-CM | POA: Diagnosis not present

## 2019-10-26 DIAGNOSIS — I10 Essential (primary) hypertension: Secondary | ICD-10-CM | POA: Diagnosis not present

## 2019-10-26 DIAGNOSIS — E785 Hyperlipidemia, unspecified: Secondary | ICD-10-CM | POA: Diagnosis not present

## 2019-10-26 DIAGNOSIS — Z79899 Other long term (current) drug therapy: Secondary | ICD-10-CM | POA: Diagnosis not present

## 2019-10-28 ENCOUNTER — Other Ambulatory Visit: Payer: Self-pay

## 2019-10-28 ENCOUNTER — Encounter (HOSPITAL_COMMUNITY)
Admission: RE | Admit: 2019-10-28 | Discharge: 2019-10-28 | Disposition: A | Discharge status: Home / Self Care | Payer: Medicare Other | Source: Ambulatory Visit | Attending: Cardiovascular Disease | Admitting: Cardiovascular Disease

## 2019-10-28 DIAGNOSIS — I10 Essential (primary) hypertension: Secondary | ICD-10-CM | POA: Insufficient documentation

## 2019-10-28 DIAGNOSIS — Z79899 Other long term (current) drug therapy: Secondary | ICD-10-CM | POA: Insufficient documentation

## 2019-10-28 DIAGNOSIS — Z955 Presence of coronary angioplasty implant and graft: Secondary | ICD-10-CM | POA: Diagnosis not present

## 2019-10-28 DIAGNOSIS — E785 Hyperlipidemia, unspecified: Secondary | ICD-10-CM | POA: Diagnosis not present

## 2019-10-28 DIAGNOSIS — I2510 Atherosclerotic heart disease of native coronary artery without angina pectoris: Secondary | ICD-10-CM | POA: Diagnosis not present

## 2019-10-30 ENCOUNTER — Other Ambulatory Visit: Payer: Self-pay

## 2019-10-30 ENCOUNTER — Encounter (HOSPITAL_COMMUNITY)
Admission: RE | Admit: 2019-10-30 | Discharge: 2019-10-30 | Disposition: A | Discharge status: Home / Self Care | Payer: Medicare Other | Source: Ambulatory Visit | Attending: Cardiovascular Disease | Admitting: Cardiovascular Disease

## 2019-10-30 VITALS — Wt 195.3 lb

## 2019-10-30 DIAGNOSIS — Z79899 Other long term (current) drug therapy: Secondary | ICD-10-CM | POA: Diagnosis not present

## 2019-10-30 DIAGNOSIS — E785 Hyperlipidemia, unspecified: Secondary | ICD-10-CM | POA: Diagnosis not present

## 2019-10-30 DIAGNOSIS — I2510 Atherosclerotic heart disease of native coronary artery without angina pectoris: Secondary | ICD-10-CM | POA: Diagnosis not present

## 2019-10-30 DIAGNOSIS — Z955 Presence of coronary angioplasty implant and graft: Secondary | ICD-10-CM | POA: Diagnosis not present

## 2019-10-30 DIAGNOSIS — I10 Essential (primary) hypertension: Secondary | ICD-10-CM | POA: Diagnosis not present

## 2019-10-30 NOTE — Progress Notes (Signed)
Nutrition Note - Follow up   Spoke with pt today about carbohydrate counting, and eating a consistent amount of carbohydrates across the day. Reviewed the benefits of carbohydrate counting and eating a consistent amount of carbohydrates across the day. Showed pt how to calculate carbohydrate servings, and distributed handouts for patient to practice. Recommended pt eat 3-4 servings of carbohydrates at meals. Discussed the importance of creating a balanced meal with the addition of protein and non-starchy vegetables. Distributed recipes and snack ideas to patient to try. Additionally discussed with patient that exercise may cause blood sugar to decrease and that we may need to add in a snack before or after workout, to manage any changes. Distributed handout of snack ideas that would provide a combination of carbohydrates and protein. Pt verbalized understanding of material discussed today. Distributed RD contact information.     Michaele Offer, MS, RDN, LDN

## 2019-11-03 NOTE — Progress Notes (Signed)
Cardiac Individual Treatment Plan  Patient Details  Name: Corey Cervantes MRN: [id number] Date of Birth: 1940/07/24 Referring Provider:     CARDIAC REHAB PHASE II ORIENTATION from 10/01/2019 in [facility]  Referring Provider Sherren Mocha, MD      Initial Encounter Date:    CARDIAC REHAB PHASE II ORIENTATION from 10/01/2019 in [facility]  Date 10/01/19      Visit Diagnosis: S/P DES mLAD 07/29/19  Patient's Home Medications on Admission:  Current Outpatient Medications:  .  ASHWAGANDHA PO, Take 1 tablet by mouth daily.  (Patient not taking: Reported on 10/01/2019), Disp: , Rfl:  .  aspirin EC 81 MG tablet, Take 1 tablet (81 mg total) by mouth daily., Disp: , Rfl:  .  b complex vitamins tablet, Take 1 tablet by mouth daily., Disp: , Rfl:  .  buPROPion (WELLBUTRIN) 100 MG tablet, Take 100 mg by mouth daily., Disp: , Rfl:  .  Cholecalciferol (VITAMIN D-3) 25 MCG (1000 UT) CAPS, Take 1,000 Units by mouth daily. , Disp: , Rfl:  .  CHONDROITIN SULFATE PO, Take 2 tablets by mouth daily. , Disp: , Rfl:  .  clopidogrel (PLAVIX) 75 MG tablet, Take 1 tablet (75 mg total) by mouth daily., Disp: 90 tablet, Rfl: 3 .  co-enzyme Q-10 30 MG capsule, Take 30 mg by mouth daily., Disp: , Rfl:  .  Evolocumab (REPATHA SURECLICK) 983 MG/ML SOAJ, Inject 1 pen into the skin every 14 (fourteen) days., Disp: 2 pen, Rfl: 11 .  hydrochlorothiazide (MICROZIDE) 12.5 MG capsule, TAKE 1 CAPSULE BY MOUTH EVERY DAY (Patient taking differently: Take 12.5 mg by mouth daily. ), Disp: 90 capsule, Rfl: 2 .  metFORMIN (GLUCOPHAGE) 500 MG tablet, Take 1 tablet (500 mg total) by mouth daily with breakfast., Disp: 90 tablet, Rfl: 1 .  metoprolol succinate (TOPROL-XL) 25 MG 24 hr tablet, Take 1 tablet (25 mg total) by mouth daily. Take with or immediately following a meal., Disp: 90 tablet, Rfl: 3 .  naphazoline-pheniramine (VISINE) 0.025-0.3 % ophthalmic solution, Place  1 drop into both eyes 4 (four) times daily as needed for eye irritation. Visine, Disp: , Rfl:  .  nitroGLYCERIN (NITROSTAT) 0.4 MG SL tablet, Place 1 tablet (0.4 mg total) under the tongue every 5 (five) minutes as needed for chest pain., Disp: 25 tablet, Rfl: 1 .  vitamin E 180 MG (400 UNITS) capsule, Take 400 Units by mouth daily., Disp: , Rfl:   Current Facility-Administered Medications:  .  0.9 %  sodium chloride infusion, 500 mL, Intravenous, Continuous, Gatha Mayer, MD  Past Medical History: Past Medical History:  Diagnosis Date  . Allergy   . Arthritis   . CAD (coronary artery disease) 07/29/2019   DES to LAD  . Cancer [facility]) many years ago    melanoma  . Hx of adenomatous colonic polyps 08/30/2016  . Hyperlipidemia   . Hypertension     Tobacco Use: Social History   Tobacco Use  Smoking Status Former Smoker  . Years: 15.00  . Types: Cigarettes, Pipe, Cigars  . Quit date: 02/26/1981  . Years since quitting: 38.7  Smokeless Tobacco Never Used    Labs: Recent Chemical engineer    Labs for ITP Cardiac and Pulmonary Rehab Latest Ref Rng & Units 03/11/2017 03/07/2018 09/01/2018 03/11/2019 08/20/2019   Cholestrol 100 - 199 mg/dL 250(H) 221(H) 238(H) 232(H) -   LDLCALC 0 - 99 mg/dL 178(H) 157(H) 164(H) 156(H) -  HDL >39 mg/dL 59 47 56 53 -   Trlycerides 0 - 149 mg/dL 66 87 91 126 -   Hemoglobin A1c 4.8 - 5.6 % 6.0(H) 6.4(H) 6.2(H) 6.1(H) 6.3(H)      Capillary Blood Glucose: Lab Results  Component Value Date   GLUCAP 114 (H) 10/14/2019   GLUCAP 105 (H) 10/12/2019   GLUCAP 119 (H) 10/12/2019   GLUCAP 112 (H) 10/07/2019   GLUCAP 152 (H) 10/07/2019     Exercise Target Goals: Exercise Program Goal: Individual exercise prescription set using results from initial 6 min walk test and THRR while considering  patient's activity barriers and safety.   Exercise Prescription Goal: Initial exercise prescription builds to 30-45 minutes a day of aerobic activity, 2-3 days per  week.  Home exercise guidelines will be given to patient during program as part of exercise prescription that the participant will acknowledge.  Activity Barriers & Risk Stratification:  Activity Barriers & Cardiac Risk Stratification - 10/01/19 1152      Activity Barriers & Cardiac Risk Stratification   Activity Barriers Joint Problems;Balance Concerns   Right knee pain at times, Right shoulder   Cardiac Risk Stratification High           6 Minute Walk:  6 Minute Walk    Row Name 10/01/19 1149         6 Minute Walk   Phase Initial     Distance 1871 feet     Walk Time 6 minutes     # of Rest Breaks 0     MPH 2.37     METS 4.51     RPE 7     Perceived Dyspnea  0     VO2 Peak 15.79     Symptoms No     Resting HR 63 bpm     Resting BP 142/78     Resting Oxygen Saturation  98 %     Exercise Oxygen Saturation  during 6 min walk 97 %     Max Ex. HR 97 bpm     Max Ex. BP 140/72     2 Minute Post BP 128/66            Oxygen Initial Assessment:   Oxygen Re-Evaluation:   Oxygen Discharge (Final Oxygen Re-Evaluation):   Initial Exercise Prescription:  Initial Exercise Prescription - 10/01/19 1100      Date of Initial Exercise RX and Referring Provider   Date 10/01/19    Referring Provider Sherren Mocha, MD    Expected Discharge Date 11/27/19      Bike   Level 2    Minutes 15    METs 3      NuStep   Level 3    SPM 85    Minutes 15    METs 2.2      Prescription Details   Frequency (times per week) 3    Duration Progress to 30 minutes of continuous aerobic without signs/symptoms of physical distress      Intensity   THRR 40-80% of Max Heartrate 57-114    Ratings of Perceived Exertion 11-13    Perceived Dyspnea 0-4      Progression   Progression Continue progressive overload as per policy without signs/symptoms or physical distress.      Resistance Training   Training Prescription Yes    Weight 3 lbs    Reps 10-15           Perform  Capillary Blood Glucose checks as  needed.  Exercise Prescription Changes:   Exercise Prescription Changes    Row Name 10/05/19 0715 10/19/19 0702 11/04/19 0656         Response to Exercise   Blood Pressure (Admit) 156/74 160/72 112/62     Blood Pressure (Exercise) 170/76 156/80 144/62     Blood Pressure (Exit) 130/76 112/70 114/62     Heart Rate (Admit) 66 bpm 67 bpm 84 bpm     Heart Rate (Exercise) 104 bpm 116 bpm 106 bpm     Heart Rate (Exit) 63 bpm 77 bpm 84 bpm     Rating of Perceived Exertion (Exercise) 12 12 12      Symptoms none none none     Comments Off to a good start with exercise.  -- --     Duration Continue with 30 min of aerobic exercise without signs/symptoms of physical distress. Continue with 30 min of aerobic exercise without signs/symptoms of physical distress. Continue with 30 min of aerobic exercise without signs/symptoms of physical distress.     Intensity THRR unchanged THRR unchanged THRR unchanged       Progression   Progression Continue to progress workloads to maintain intensity without signs/symptoms of physical distress. Continue to progress workloads to maintain intensity without signs/symptoms of physical distress. Continue to progress workloads to maintain intensity without signs/symptoms of physical distress.     Average METs 3.5 4.3 4.1       Resistance Training   Training Prescription Yes Yes No     Weight 3 lbs 5lbs --     Reps 10-15 10-15 --     Time 10 Minutes 10 Minutes --       Interval Training   Interval Training -- No No       Bike   Level 2 3 4      Minutes 15 15 15      METs 3.7 5 4.5       NuStep   Level 3 3 4      SPM 85 85 85     Minutes 15 15 15      METs 3.4 3.7 3.8       Home Exercise Plan   Plans to continue exercise at -- Home (comment)  Walking Home (comment)  Walking     Frequency -- Add 4 additional days to program exercise sessions. Add 4 additional days to program exercise sessions.     Initial Home Exercises  Provided -- 10/19/19 10/19/19            Exercise Comments:   Exercise Comments    Row Name 10/05/19 [phone] 10/19/19 0748 10/21/19 0855 11/04/19 0715     Exercise Comments Patient tolerated 1st session of exercise well without symptoms. Reviewed home exercise guidelines with patient. Reviewed METs with patient. Reviewed METs and goals with patient.           Exercise Goals and Review:   Exercise Goals    Row Name 10/01/19 1157             Exercise Goals   Increase Physical Activity Yes       Intervention Provide advice, education, support and counseling about physical activity/exercise needs.;Develop an individualized exercise prescription for aerobic and resistive training based on initial evaluation findings, risk stratification, comorbidities and participant's personal goals.       Expected Outcomes Short Term: Attend rehab on a regular basis to increase amount of physical activity.;Long Term: Add in home exercise to make exercise part of routine  and to increase amount of physical activity.;Long Term: Exercising regularly at least 3-5 days a week.       Increase Strength and Stamina Yes       Intervention Provide advice, education, support and counseling about physical activity/exercise needs.;Develop an individualized exercise prescription for aerobic and resistive training based on initial evaluation findings, risk stratification, comorbidities and participant's personal goals.       Expected Outcomes Short Term: Increase workloads from initial exercise prescription for resistance, speed, and METs.;Short Term: Perform resistance training exercises routinely during rehab and add in resistance training at home;Long Term: Improve cardiorespiratory fitness, muscular endurance and strength as measured by increased METs and functional capacity (6MWT)       Able to understand and use rate of perceived exertion (RPE) scale Yes       Intervention Provide education and explanation on how  to use RPE scale       Expected Outcomes Short Term: Able to use RPE daily in rehab to express subjective intensity level;Long Term:  Able to use RPE to guide intensity level when exercising independently       Knowledge and understanding of Target Heart Rate Range (THRR) Yes       Intervention Provide education and explanation of THRR including how the numbers were predicted and where they are located for reference       Expected Outcomes Short Term: Able to state/look up THRR;Long Term: Able to use THRR to govern intensity when exercising independently       Able to check pulse independently Yes       Intervention Provide education and demonstration on how to check pulse in carotid and radial arteries.;Review the importance of being able to check your own pulse for safety during independent exercise       Expected Outcomes Short Term: Able to explain why pulse checking is important during independent exercise;Long Term: Able to check pulse independently and accurately       Understanding of Exercise Prescription Yes       Intervention Provide education, explanation, and written materials on patient's individual exercise prescription       Expected Outcomes Short Term: Able to explain program exercise prescription;Long Term: Able to explain home exercise prescription to exercise independently              Exercise Goals Re-Evaluation :  Exercise Goals Re-Evaluation    Row Name 10/05/19 0808 10/19/19 0748 11/04/19 0715         Exercise Goal Re-Evaluation   Exercise Goals Review Increase Physical Activity;Able to understand and use rate of perceived exertion (RPE) scale Increase Physical Activity;Able to understand and use rate of perceived exertion (RPE) scale;Increase Strength and Stamina;Knowledge and understanding of Target Heart Rate Range (THRR);Understanding of Exercise Prescription Increase Physical Activity;Able to understand and use rate of perceived exertion (RPE) scale;Increase  Strength and Stamina;Knowledge and understanding of Target Heart Rate Range (THRR);Understanding of Exercise Prescription     Comments Patient able to understand and use RPE scale appropriately. Reviewed home exercise guidelines with patient including endpoints, temperature precautions, target heart rate and rate of perceived exertion. Pt is walking 2.5 miles, 35-40 minutes 3-4 days/week as his mode of home exercise. Pt also has a stationary bike at home that he rides when weather doesn't permit walking outdoors. Pt has 5 lb. weights that he uses for his resistance training. Patient continues to walk 2.5 miles, 35-40 minutes 3 days/week in addition to exercise at cardiac rehab. Patient's goal  is to decrease the amount of medications that he has to take and to be able to do more. 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     Expected Outcomes Progress workloads as tolerated to help improve cardiorespiratory fitness. Patient will continue aerobic exercise 30-40 minutes, 6-7 days/week to help improve strength and stamina and achieve personal health and fitness goals. 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            Discharge Exercise Prescription (Final Exercise Prescription Changes):  Exercise Prescription Changes - 11/04/19 0656      Response to Exercise   Blood Pressure (Admit) 112/62    Blood Pressure (Exercise) 144/62    Blood Pressure (Exit) 114/62    Heart Rate (Admit) 84 bpm    Heart Rate (Exercise) 106 bpm    Heart Rate (Exit) 84 bpm    Rating of Perceived Exertion (Exercise) 12    Symptoms none    Duration Continue with 30 min of aerobic exercise without signs/symptoms of physical distress.    Intensity THRR unchanged      Progression   Progression Continue to progress workloads to maintain intensity without signs/symptoms of physical distress.    Average METs 4.1       Resistance Training   Training Prescription No      Interval Training   Interval Training No      Bike   Level 4    Minutes 15    METs 4.5      NuStep   Level 4    SPM 85    Minutes 15    METs 3.8      Home Exercise Plan   Plans to continue exercise at Home (comment)   Walking   Frequency Add 4 additional days to program exercise sessions.    Initial Home Exercises Provided 10/19/19           Nutrition:  Target Goals: Understanding of nutrition guidelines, daily intake of sodium 1500mg , cholesterol 200mg , calories 30% from fat and 7% or less from saturated fats, daily to have 5 or more servings of fruits and vegetables.  Biometrics:  Pre Biometrics - 10/01/19 0845      Pre Biometrics   Waist Circumference 42 inches    Hip Circumference 43.25 inches    Waist to Hip Ratio 0.97 %    Triceps Skinfold 22 mm    % Body Fat 30.4 %    Grip Strength 38 kg    Flexibility 13.75 in    Single Leg Stand 3.68 seconds   High fall Risk           Nutrition Therapy Plan and Nutrition Goals:  Nutrition Therapy & Goals - 11/03/19 0949      Nutrition Therapy   Diet Heart Healthy      Personal Nutrition Goals   Nutrition Goal Pt to build a healthy plate including vegetables, fruits, whole grains, and low-fat dairy products in a heart healthy meal plan.    Personal Goal #2 Pt able to name foods that affect blood glucose.    Personal Goal #3 Pt to make food choices to reduce A1C to less than 5.7 and out of prediabetes range      Intervention Plan   Intervention Prescribe, educate and counsel regarding individualized specific dietary modifications aiming towards targeted core  components such as weight, hypertension, lipid management, diabetes, heart failure and other comorbidities.;Nutrition handout(s) given to patient.    Expected Outcomes Short Term Goal: A plan has been developed with personal nutrition goals set during dietitian appointment.;Long Term Goal: Adherence to  prescribed nutrition plan.           Nutrition Assessments:  Nutrition Assessments - 10/29/19 1116      MEDFICTS Scores   Pre Score 36           Nutrition Goals Re-Evaluation:  Nutrition Goals Re-Evaluation    Row Name 11/03/19 0950             Goals   Current Weight 195 lb 5.2 oz (88.6 kg)       Nutrition Goal Pt to build a healthy plate including vegetables, fruits, whole grains, and low-fat dairy products in a heart healthy meal plan.         Personal Goal #2 Re-Evaluation   Personal Goal #2 Pt able to name foods that affect blood glucose.         Personal Goal #3 Re-Evaluation   Personal Goal #3 Pt to make food choices to reduce A1C to less than 5.7 and out of prediabetes range              Nutrition Goals Re-Evaluation:  Nutrition Goals Re-Evaluation    [place] Name 11/03/19 0950             Goals   Current Weight 195 lb 5.2 oz (88.6 kg)       Nutrition Goal Pt to build a healthy plate including vegetables, fruits, whole grains, and low-fat dairy products in a heart healthy meal plan.         Personal Goal #2 Re-Evaluation   Personal Goal #2 Pt able to name foods that affect blood glucose.         Personal Goal #3 Re-Evaluation   Personal Goal #3 Pt to make food choices to reduce A1C to less than 5.7 and out of prediabetes range              Nutrition Goals Discharge (Final Nutrition Goals Re-Evaluation):  Nutrition Goals Re-Evaluation - 11/03/19 0950      Goals   Current Weight 195 lb 5.2 oz (88.6 kg)    Nutrition Goal Pt to build a healthy plate including vegetables, fruits, whole grains, and low-fat dairy products in a heart healthy meal plan.      Personal Goal #2 Re-Evaluation   Personal Goal #2 Pt able to name foods that affect blood glucose.      Personal Goal #3 Re-Evaluation   Personal Goal #3 Pt to make food choices to reduce A1C to less than 5.7 and out of prediabetes range           Psychosocial: Target Goals: Acknowledge  presence or absence of significant depression and/or stress, maximize coping skills, provide positive support system. Participant is able to verbalize types and ability to use techniques and skills needed for reducing stress and depression.  Initial Review & Psychosocial Screening:  Initial Psych Review & Screening - 10/01/19 0935      Initial Review   Current issues with Current Stress Concerns    Source of Stress Concerns Family    Comments Anshul's son had a Mi with cardiac arrest on memorial day right before his hospital admission. Harim is still concerned about his health      [place]?  Yes   Less has his wife and children for support   Comments Will offer emotional support as needed. Lorain says his son is doing better now      Barriers   Psychosocial barriers to participate in program The patient should benefit from training in stress management and relaxation.      Screening Interventions   Interventions Encouraged to exercise    Expected Outcomes Long Term Goal: Stressors or current issues are controlled or eliminated.           Quality of Life Scores:  Quality of Life - 10/01/19 1141      Quality of Life   Select Quality of Life      Quality of Life Scores   Health/Function Pre 27.07 %    Socioeconomic Pre 25.81 %    Psych/Spiritual Pre 25.5 %    Family Pre 28.8 %    GLOBAL Pre 26.71 %          Scores of 19 and below usually indicate a poorer quality of life in these areas.  A difference of  2-3 points is a clinically meaningful difference.  A difference of 2-3 points in the total score of the Quality of Life Index has been associated with significant improvement in overall quality of life, self-image, physical symptoms, and general health in studies assessing change in quality of life.  PHQ-9: Recent Review Flowsheet Data    Depression screen [facility] 2/9 10/01/2019 08/20/2019 03/11/2019 10/09/2018 09/01/2018   Decreased Interest 0 0 0 0 0    Down, Depressed, Hopeless 0 0 0 0 0   PHQ - 2 Score 0 0 0 0 0     Interpretation of Total Score  Total Score Depression Severity:  1-4 = Minimal depression, 5-9 = Mild depression, 10-14 = Moderate depression, 15-19 = Moderately severe depression, 20-27 = Severe depression   Psychosocial Evaluation and Intervention:  Psychosocial Evaluation - 10/05/19 1250      Psychosocial Evaluation & Interventions   Interventions Encouraged to exercise with the program and follow exercise prescription;Stress management education;Relaxation education    Comments Mr. Vivanco admits to being concerned over his son's health. States his son had a cardiac arrest event recently. Mr. Franchini does present to CR with a positive attitude and outlook for himself. He feels his stress secondary to his son's health is managable and no interventions are needed. Mr. Mcintyre enjoys painting and writing and utilizes these hobbies to manage his stress.    Expected Outcomes Mr. Gorka will continue to have a positive attitude and outlook. He will utilize his support system and openly discuss his worries regarding his son's health with his family    Continue Psychosocial Services  Follow up required by staff           Psychosocial Re-Evaluation:  Psychosocial Re-Evaluation    [place] Name 11/03/19 1626             Psychosocial Re-Evaluation   Current issues with None Identified       Comments Mr. Reppond previously admitted to stress related to worring about his son who recently also had a cardiac event. Mr. Henes states his stress has diminished. He is confident that his son will be OK as his son is also participating in another cardiac rehab program.       Expected Outcomes Mr. Bosserman will continue to have a positive outlook and attitude towards his health and his sons health.       Interventions  Encouraged to attend Cardiac Rehabilitation for the exercise       Continue Psychosocial Services  No Follow up required               Psychosocial Discharge (Final Psychosocial Re-Evaluation):  Psychosocial Re-Evaluation - 11/03/19 1626      Psychosocial Re-Evaluation   Current issues with None Identified    Comments Mr. Keidel previously admitted to stress related to worring about his son who recently also had a cardiac event. Mr. Cavenaugh states his stress has diminished. He is confident that his son will be OK as his son is also participating in another cardiac rehab program.    Expected Outcomes Mr. Baldi will continue to have a positive outlook and attitude towards his health and his sons health.    Interventions Encouraged to attend Cardiac Rehabilitation for the exercise    Continue Psychosocial Services  No Follow up required           Vocational Rehabilitation: Provide vocational rehab assistance to qualifying candidates.   Vocational Rehab Evaluation & Intervention:  Vocational Rehab - 10/01/19 0935      Initial Vocational Rehab Evaluation & Intervention   Assessment shows need for Vocational Rehabilitation No   Gleb is currently working and does not need vocational rehab at this time.          Education: Education Goals: Education classes will be provided on a weekly basis, covering required topics. Participant will state understanding/return demonstration of topics presented.  Learning Barriers/Preferences:  Learning Barriers/Preferences - 10/01/19 1142      Learning Barriers/Preferences   Learning Barriers Sight;Hearing   Wears glasses, HOH   Learning Preferences Computer/Internet;Pictoral;Skilled Demonstration;Video           Education Topics: Count Your Pulse:  -Group instruction provided by verbal instruction, demonstration, patient participation and written materials to support subject.  Instructors address importance of being able to find your pulse and how to count your pulse when at home without a heart monitor.  Patients get hands on experience counting their pulse  with staff help and individually.   Heart Attack, Angina, and Risk Factor Modification:  -Group instruction provided by verbal instruction, video, and written materials to support subject.  Instructors address signs and symptoms of angina and heart attacks.    Also discuss risk factors for heart disease and how to make changes to improve heart health risk factors.   Functional Fitness:  -Group instruction provided by verbal instruction, demonstration, patient participation, and written materials to support subject.  Instructors address safety measures for doing things around the house.  Discuss how to get up and down off the floor, how to pick things up properly, how to safely get out of a chair without assistance, and balance training.   Meditation and Mindfulness:  -Group instruction provided by verbal instruction, patient participation, and written materials to support subject.  Instructor addresses importance of mindfulness and meditation practice to help reduce stress and improve awareness.  Instructor also leads participants through a meditation exercise.    Stretching for Flexibility and Mobility:  -Group instruction provided by verbal instruction, patient participation, and written materials to support subject.  Instructors lead participants through series of stretches that are designed to increase flexibility thus improving mobility.  These stretches are additional exercise for major muscle groups that are typically performed during regular warm up and cool down.   Hands Only CPR:  -Group verbal, video, and participation provides a basic overview of AHA guidelines for community  CPR. Role-play of emergencies allow participants the opportunity to practice calling for help and chest compression technique with discussion of AED use.   Hypertension: -Group verbal and written instruction that provides a basic overview of hypertension including the most recent diagnostic guidelines, risk  factor reduction with self-care instructions and medication management.    Nutrition I class: Heart Healthy Eating:  -Group instruction provided by PowerPoint slides, verbal discussion, and written materials to support subject matter. The instructor gives an explanation and review of the Therapeutic Lifestyle Changes diet recommendations, which includes a discussion on lipid goals, dietary fat, sodium, fiber, plant stanol/sterol esters, sugar, and the components of a well-balanced, healthy diet.   Nutrition II class: Lifestyle Skills:  -Group instruction provided by PowerPoint slides, verbal discussion, and written materials to support subject matter. The instructor gives an explanation and review of label reading, grocery shopping for heart health, heart healthy recipe modifications, and ways to make healthier choices when eating out.   Diabetes Question & Answer:  -Group instruction provided by PowerPoint slides, verbal discussion, and written materials to support subject matter. The instructor gives an explanation and review of diabetes co-morbidities, pre- and post-prandial blood glucose goals, pre-exercise blood glucose goals, signs, symptoms, and treatment of hypoglycemia and hyperglycemia, and foot care basics.   Diabetes Blitz:  -Group instruction provided by PowerPoint slides, verbal discussion, and written materials to support subject matter. The instructor gives an explanation and review of the physiology behind type 1 and type 2 diabetes, diabetes medications and rational behind using different medications, pre- and post-prandial blood glucose recommendations and Hemoglobin A1c goals, diabetes diet, and exercise including blood glucose guidelines for exercising safely.    Portion Distortion:  -Group instruction provided by PowerPoint slides, verbal discussion, written materials, and food models to support subject matter. The instructor gives an explanation of serving size versus  portion size, changes in portions sizes over the last 20 years, and what consists of a serving from each food group.   Stress Management:  -Group instruction provided by verbal instruction, video, and written materials to support subject matter.  Instructors review role of stress in heart disease and how to cope with stress positively.     Exercising on Your Own:  -Group instruction provided by verbal instruction, power point, and written materials to support subject.  Instructors discuss benefits of exercise, components of exercise, frequency and intensity of exercise, and end points for exercise.  Also discuss use of nitroglycerin and activating EMS.  Review options of places to exercise outside of rehab.  Review guidelines for sex with heart disease.   Cardiac Drugs I:  -Group instruction provided by verbal instruction and written materials to support subject.  Instructor reviews cardiac drug classes: antiplatelets, anticoagulants, beta blockers, and statins.  Instructor discusses reasons, side effects, and lifestyle considerations for each drug class.   Cardiac Drugs II:  -Group instruction provided by verbal instruction and written materials to support subject.  Instructor reviews cardiac drug classes: angiotensin converting enzyme inhibitors (ACE-I), angiotensin II receptor blockers (ARBs), nitrates, and calcium channel blockers.  Instructor discusses reasons, side effects, and lifestyle considerations for each drug class.   Anatomy and Physiology of the Circulatory System:  Group verbal and written instruction and models provide basic cardiac anatomy and physiology, with the coronary electrical and arterial systems. Review of: AMI, Angina, Valve disease, Heart Failure, Peripheral Artery Disease, Cardiac Arrhythmia, Pacemakers, and the ICD.   Other Education:  -Group or individual verbal, written, or video instructions that  support the educational goals of the cardiac rehab  program.   Holiday Eating Survival Tips:  -Group instruction provided by PowerPoint slides, verbal discussion, and written materials to support subject matter. The instructor gives patients tips, tricks, and techniques to help them not only survive but enjoy the holidays despite the onslaught of food that accompanies the holidays.   Knowledge Questionnaire Score:  Knowledge Questionnaire Score - 10/01/19 1146      Knowledge Questionnaire Score   Pre Score 22/24           Core Components/Risk Factors/Patient Goals at Admission:  Personal Goals and Risk Factors at Admission - 10/01/19 1146      Core Components/Risk Factors/Patient Goals on Admission    Weight Management Yes;Weight Maintenance    Admit Weight 195 lb 5.2 oz (88.6 kg)    Expected Outcomes Short Term: Continue to assess and modify interventions until short term weight is achieved;Long Term: Adherence to nutrition and physical activity/exercise program aimed toward attainment of established weight goal;Weight Maintenance: Understanding of the daily nutrition guidelines, which includes 25-35% calories from fat, 7% or less cal from saturated fats, less than 200mg  cholesterol, less than 1.5gm of sodium, & 5 or more servings of fruits and vegetables daily;Understanding recommendations for meals to include 15-35% energy as protein, 25-35% energy from fat, 35-60% energy from carbohydrates, less than 200mg  of dietary cholesterol, 20-35 gm of total fiber daily;Understanding of distribution of calorie intake throughout the day with the consumption of 4-5 meals/snacks    Diabetes Yes    Intervention Provide education about signs/symptoms and action to take for hypo/hyperglycemia.;Provide education about proper nutrition, including hydration, and aerobic/resistive exercise prescription along with prescribed medications to achieve blood glucose in normal ranges: Fasting glucose 65-99 mg/dL    Expected Outcomes Short Term: Participant  verbalizes understanding of the signs/symptoms and immediate care of hyper/hypoglycemia, proper foot care and importance of medication, aerobic/resistive exercise and nutrition plan for blood glucose control.;Long Term: Attainment of HbA1C < 7%.    Hypertension Yes    Intervention Provide education on lifestyle modifcations including regular physical activity/exercise, weight management, moderate sodium restriction and increased consumption of fresh fruit, vegetables, and low fat dairy, alcohol moderation, and smoking cessation.;Monitor prescription use compliance.    Expected Outcomes Short Term: Continued assessment and intervention until BP is < 140/25mm HG in hypertensive participants. < 130/33mm HG in hypertensive participants with diabetes, heart failure or chronic kidney disease.;Long Term: Maintenance of blood pressure at goal levels.    Lipids Yes    Intervention Provide education and support for participant on nutrition & aerobic/resistive exercise along with prescribed medications to achieve LDL 70mg , HDL >40mg .    Expected Outcomes Short Term: Participant states understanding of desired cholesterol values and is compliant with medications prescribed. Participant is following exercise prescription and nutrition guidelines.;Long Term: Cholesterol controlled with medications as prescribed, with individualized exercise RX and with personalized nutrition plan. Value goals: LDL < 70mg , HDL > 40 mg.    Stress Yes    Intervention Offer individual and/or small group education and counseling on adjustment to heart disease, stress management and health-related lifestyle change. Teach and support self-help strategies.;Refer participants experiencing significant psychosocial distress to appropriate mental health specialists for further evaluation and treatment. When possible, include family members and significant others in education/counseling sessions.    Expected Outcomes Short Term: Participant  demonstrates changes in health-related behavior, relaxation and other stress management skills, ability to obtain effective social support, and compliance with psychotropic medications if prescribed.;Long  Term: Emotional wellbeing is indicated by absence of clinically significant psychosocial distress or social isolation.           Core Components/Risk Factors/Patient Goals Review:   Goals and Risk Factor Review    Row Name 10/05/19 1254 11/03/19 1633           Core Components/Risk Factors/Patient Goals Review   Personal Goals Review Weight Management/Obesity;Lipids;Diabetes;Stress;Hypertension Weight Management/Obesity;Lipids;Diabetes;Stress;Hypertension      Review Mr. Moya has multiple CAD risk factors. He is eager to participate in CR for modification. His goals are to increase his stamina and strength and sustain an exercise program post graduation from CR. Will continue to monitor progress towards goals over the next 30 days. Mr. Lafoy has multiple CAD risk factors. He continues to be eager to participate in CR for modification. His goals are to increase his stamina and strength and sustain an exercise program post graduation from CR. He is on track to meet his cardiac rehab goals. Will continue to monitor progress towards goals over the next 30 days.      Expected Outcomes Mr. Almanza will continue to participate in CR for risk factor modification. Mr. Lebeda will continue to participate in CR for risk factor modification.             Core Components/Risk Factors/Patient Goals at Discharge (Final Review):   Goals and Risk Factor Review - 11/03/19 1633      Core Components/Risk Factors/Patient Goals Review   Personal Goals Review Weight Management/Obesity;Lipids;Diabetes;Stress;Hypertension    Review Mr. Reede has multiple CAD risk factors. He continues to be eager to participate in CR for modification. His goals are to increase his stamina and strength and sustain an exercise  program post graduation from CR. He is on track to meet his cardiac rehab goals. Will continue to monitor progress towards goals over the next 30 days.    Expected Outcomes Mr. Whitwell will continue to participate in CR for risk factor modification.           ITP Comments:  ITP Comments    Row Name 10/01/19 0933 10/05/19 1248 11/03/19 1617       ITP Comments Dr Fransico Him MD, Medical Director 30 day ITP review: Mr. Chait completed his first cardiac rehab exercise session today and tolerated well. VSS. Max exertional BP 170/76 however patient states it has only been 20 min since he took his BP medications. He is instructed to take his BP medications 60 min prior to exercise. Denied complaints. Worked to an RPE of 12. Eager to participate in CR. Will continue to monitor patient progression towards goals over the next 30 days. 30 day ITP review: Mr. Viens has completed 11 cardiac rehab exercise sessions since admission. He is tolerating workload increases however he did have to be slowed while riding the bike secondary to hypertension (192/68). BP has been controlled since this episode as patient now better understands importance of not working past an RPE of 13. Mr. Staebell has a personal goal of sustaining his exercise and increasing his stamina and strength. He is currently on track to meet his goals. Will continue to monitor progression towards goals over the next 30 days.            Comments: see ITP comments

## 2019-11-04 ENCOUNTER — Other Ambulatory Visit: Payer: Self-pay

## 2019-11-04 ENCOUNTER — Encounter (HOSPITAL_COMMUNITY)
Admission: RE | Admit: 2019-11-04 | Discharge: 2019-11-04 | Disposition: A | Discharge status: Home / Self Care | Payer: Medicare Other | Source: Ambulatory Visit | Attending: Cardiovascular Disease | Admitting: Cardiovascular Disease

## 2019-11-04 DIAGNOSIS — I10 Essential (primary) hypertension: Secondary | ICD-10-CM | POA: Diagnosis not present

## 2019-11-04 DIAGNOSIS — E785 Hyperlipidemia, unspecified: Secondary | ICD-10-CM | POA: Diagnosis not present

## 2019-11-04 DIAGNOSIS — Z955 Presence of coronary angioplasty implant and graft: Secondary | ICD-10-CM

## 2019-11-04 DIAGNOSIS — Z79899 Other long term (current) drug therapy: Secondary | ICD-10-CM | POA: Diagnosis not present

## 2019-11-04 DIAGNOSIS — I2510 Atherosclerotic heart disease of native coronary artery without angina pectoris: Secondary | ICD-10-CM | POA: Diagnosis not present

## 2019-11-06 ENCOUNTER — Other Ambulatory Visit: Payer: Self-pay

## 2019-11-06 ENCOUNTER — Encounter (HOSPITAL_COMMUNITY)
Admission: RE | Admit: 2019-11-06 | Discharge: 2019-11-06 | Disposition: A | Discharge status: Home / Self Care | Payer: Medicare Other | Source: Ambulatory Visit | Attending: Cardiovascular Disease | Admitting: Cardiovascular Disease

## 2019-11-06 DIAGNOSIS — I10 Essential (primary) hypertension: Secondary | ICD-10-CM | POA: Diagnosis not present

## 2019-11-06 DIAGNOSIS — E785 Hyperlipidemia, unspecified: Secondary | ICD-10-CM | POA: Diagnosis not present

## 2019-11-06 DIAGNOSIS — Z79899 Other long term (current) drug therapy: Secondary | ICD-10-CM | POA: Diagnosis not present

## 2019-11-06 DIAGNOSIS — Z955 Presence of coronary angioplasty implant and graft: Secondary | ICD-10-CM

## 2019-11-06 DIAGNOSIS — I2510 Atherosclerotic heart disease of native coronary artery without angina pectoris: Secondary | ICD-10-CM | POA: Diagnosis not present

## 2019-11-09 ENCOUNTER — Encounter (HOSPITAL_COMMUNITY)
Admission: RE | Admit: 2019-11-09 | Discharge: 2019-11-09 | Disposition: A | Discharge status: Home / Self Care | Payer: Medicare Other | Source: Ambulatory Visit | Attending: Cardiovascular Disease | Admitting: Cardiovascular Disease

## 2019-11-09 ENCOUNTER — Other Ambulatory Visit: Payer: Self-pay

## 2019-11-09 DIAGNOSIS — I2510 Atherosclerotic heart disease of native coronary artery without angina pectoris: Secondary | ICD-10-CM | POA: Diagnosis not present

## 2019-11-09 DIAGNOSIS — Z955 Presence of coronary angioplasty implant and graft: Secondary | ICD-10-CM | POA: Diagnosis not present

## 2019-11-09 DIAGNOSIS — E785 Hyperlipidemia, unspecified: Secondary | ICD-10-CM | POA: Diagnosis not present

## 2019-11-09 DIAGNOSIS — Z79899 Other long term (current) drug therapy: Secondary | ICD-10-CM | POA: Diagnosis not present

## 2019-11-09 DIAGNOSIS — I10 Essential (primary) hypertension: Secondary | ICD-10-CM | POA: Diagnosis not present

## 2019-11-11 ENCOUNTER — Other Ambulatory Visit: Payer: Self-pay

## 2019-11-11 ENCOUNTER — Encounter (HOSPITAL_COMMUNITY)
Admission: RE | Admit: 2019-11-11 | Discharge: 2019-11-11 | Disposition: A | Discharge status: Home / Self Care | Payer: Medicare Other | Source: Ambulatory Visit | Attending: Cardiovascular Disease | Admitting: Cardiovascular Disease

## 2019-11-11 DIAGNOSIS — Z79899 Other long term (current) drug therapy: Secondary | ICD-10-CM | POA: Diagnosis not present

## 2019-11-11 DIAGNOSIS — I2510 Atherosclerotic heart disease of native coronary artery without angina pectoris: Secondary | ICD-10-CM | POA: Diagnosis not present

## 2019-11-11 DIAGNOSIS — Z955 Presence of coronary angioplasty implant and graft: Secondary | ICD-10-CM | POA: Diagnosis not present

## 2019-11-11 DIAGNOSIS — I10 Essential (primary) hypertension: Secondary | ICD-10-CM | POA: Diagnosis not present

## 2019-11-11 DIAGNOSIS — E785 Hyperlipidemia, unspecified: Secondary | ICD-10-CM | POA: Diagnosis not present

## 2019-11-13 ENCOUNTER — Encounter (HOSPITAL_COMMUNITY)
Admission: RE | Admit: 2019-11-13 | Discharge: 2019-11-13 | Disposition: A | Discharge status: Home / Self Care | Payer: Medicare Other | Source: Ambulatory Visit | Attending: Cardiovascular Disease | Admitting: Cardiovascular Disease

## 2019-11-13 ENCOUNTER — Other Ambulatory Visit: Payer: Self-pay

## 2019-11-13 DIAGNOSIS — E785 Hyperlipidemia, unspecified: Secondary | ICD-10-CM | POA: Diagnosis not present

## 2019-11-13 DIAGNOSIS — Z955 Presence of coronary angioplasty implant and graft: Secondary | ICD-10-CM

## 2019-11-13 DIAGNOSIS — I10 Essential (primary) hypertension: Secondary | ICD-10-CM | POA: Diagnosis not present

## 2019-11-13 DIAGNOSIS — Z79899 Other long term (current) drug therapy: Secondary | ICD-10-CM | POA: Diagnosis not present

## 2019-11-13 DIAGNOSIS — I2510 Atherosclerotic heart disease of native coronary artery without angina pectoris: Secondary | ICD-10-CM | POA: Diagnosis not present

## 2019-11-16 ENCOUNTER — Encounter (HOSPITAL_COMMUNITY)
Admission: RE | Admit: 2019-11-16 | Discharge: 2019-11-16 | Disposition: A | Discharge status: Home / Self Care | Payer: Medicare Other | Source: Ambulatory Visit | Attending: Cardiovascular Disease | Admitting: Cardiovascular Disease

## 2019-11-16 ENCOUNTER — Other Ambulatory Visit: Payer: Self-pay

## 2019-11-16 ENCOUNTER — Other Ambulatory Visit: Payer: Medicare Other

## 2019-11-16 DIAGNOSIS — E785 Hyperlipidemia, unspecified: Secondary | ICD-10-CM | POA: Diagnosis not present

## 2019-11-16 DIAGNOSIS — Z79899 Other long term (current) drug therapy: Secondary | ICD-10-CM | POA: Diagnosis not present

## 2019-11-16 DIAGNOSIS — I10 Essential (primary) hypertension: Secondary | ICD-10-CM | POA: Diagnosis not present

## 2019-11-16 DIAGNOSIS — I2510 Atherosclerotic heart disease of native coronary artery without angina pectoris: Secondary | ICD-10-CM | POA: Diagnosis not present

## 2019-11-16 DIAGNOSIS — Z955 Presence of coronary angioplasty implant and graft: Secondary | ICD-10-CM

## 2019-11-17 ENCOUNTER — Other Ambulatory Visit: Payer: Self-pay

## 2019-11-17 DIAGNOSIS — E785 Hyperlipidemia, unspecified: Secondary | ICD-10-CM

## 2019-11-17 LAB — COMPREHENSIVE METABOLIC PANEL
ALT: 10 IU/L (ref 0–44)
AST: 17 IU/L (ref 0–40)
Albumin/Globulin Ratio: 1.6 (ref 1.2–2.2)
Albumin: 4.2 g/dL (ref 3.7–4.7)
Alkaline Phosphatase: 47 IU/L (ref 44–121)
BUN/Creatinine Ratio: 15 (ref 10–24)
BUN: 21 mg/dL (ref 8–27)
Bilirubin Total: 0.5 mg/dL (ref 0.0–1.2)
CO2: 23 mmol/L (ref 20–29)
Calcium: 9.3 mg/dL (ref 8.6–10.2)
Chloride: 103 mmol/L (ref 96–106)
Creatinine, Ser: 1.4 mg/dL — ABNORMAL HIGH (ref 0.76–1.27)
GFR calc Af Amer: 56 mL/min/{1.73_m2} — ABNORMAL LOW (ref >59)
GFR calc non Af Amer: 48 mL/min/{1.73_m2} — ABNORMAL LOW (ref >59)
Globulin, Total: 2.6 g/dL (ref 1.5–4.5)
Glucose: 118 mg/dL — ABNORMAL HIGH (ref 65–99)
Potassium: 4 mmol/L (ref 3.5–5.2)
Sodium: 138 mmol/L (ref 134–144)
Total Protein: 6.8 g/dL (ref 6.0–8.5)

## 2019-11-17 LAB — LIPID PANEL
Chol/HDL Ratio: 2.6 ratio (ref 0.0–5.0)
Cholesterol, Total: 131 mg/dL (ref 100–199)
HDL: 51 mg/dL (ref >39)
LDL Chol Calc (NIH): 66 mg/dL (ref 0–99)
Triglycerides: 68 mg/dL (ref 0–149)
VLDL Cholesterol Cal: 14 mg/dL (ref 5–40)

## 2019-11-18 ENCOUNTER — Encounter (HOSPITAL_COMMUNITY)
Admission: RE | Admit: 2019-11-18 | Discharge: 2019-11-18 | Disposition: A | Discharge status: Home / Self Care | Payer: Medicare Other | Source: Ambulatory Visit | Attending: Cardiovascular Disease | Admitting: Cardiovascular Disease

## 2019-11-18 DIAGNOSIS — I10 Essential (primary) hypertension: Secondary | ICD-10-CM | POA: Diagnosis not present

## 2019-11-18 DIAGNOSIS — Z79899 Other long term (current) drug therapy: Secondary | ICD-10-CM | POA: Diagnosis not present

## 2019-11-18 DIAGNOSIS — Z955 Presence of coronary angioplasty implant and graft: Secondary | ICD-10-CM | POA: Diagnosis not present

## 2019-11-18 DIAGNOSIS — E785 Hyperlipidemia, unspecified: Secondary | ICD-10-CM | POA: Diagnosis not present

## 2019-11-18 DIAGNOSIS — I2510 Atherosclerotic heart disease of native coronary artery without angina pectoris: Secondary | ICD-10-CM | POA: Diagnosis not present

## 2019-11-19 ENCOUNTER — Encounter: Payer: Self-pay | Admitting: Family Medicine

## 2019-11-19 ENCOUNTER — Ambulatory Visit: Payer: Medicare Other | Admitting: Family Medicine

## 2019-11-20 ENCOUNTER — Other Ambulatory Visit: Payer: Self-pay

## 2019-11-20 ENCOUNTER — Ambulatory Visit: Payer: Medicare Other | Admitting: Family Medicine

## 2019-11-20 ENCOUNTER — Encounter (HOSPITAL_COMMUNITY)
Admission: RE | Admit: 2019-11-20 | Discharge: 2019-11-20 | Disposition: A | Discharge status: Home / Self Care | Payer: Medicare Other | Source: Ambulatory Visit | Attending: Cardiovascular Disease | Admitting: Cardiovascular Disease

## 2019-11-20 DIAGNOSIS — I2510 Atherosclerotic heart disease of native coronary artery without angina pectoris: Secondary | ICD-10-CM | POA: Diagnosis not present

## 2019-11-20 DIAGNOSIS — E785 Hyperlipidemia, unspecified: Secondary | ICD-10-CM | POA: Diagnosis not present

## 2019-11-20 DIAGNOSIS — Z955 Presence of coronary angioplasty implant and graft: Secondary | ICD-10-CM | POA: Diagnosis not present

## 2019-11-20 DIAGNOSIS — I10 Essential (primary) hypertension: Secondary | ICD-10-CM | POA: Diagnosis not present

## 2019-11-20 DIAGNOSIS — Z79899 Other long term (current) drug therapy: Secondary | ICD-10-CM | POA: Diagnosis not present

## 2019-11-23 ENCOUNTER — Encounter (HOSPITAL_COMMUNITY)
Admission: RE | Admit: 2019-11-23 | Discharge: 2019-11-23 | Disposition: A | Discharge status: Home / Self Care | Payer: Medicare Other | Source: Ambulatory Visit | Attending: Cardiovascular Disease | Admitting: Cardiovascular Disease

## 2019-11-23 ENCOUNTER — Other Ambulatory Visit: Payer: Self-pay

## 2019-11-23 DIAGNOSIS — Z79899 Other long term (current) drug therapy: Secondary | ICD-10-CM | POA: Diagnosis not present

## 2019-11-23 DIAGNOSIS — I2510 Atherosclerotic heart disease of native coronary artery without angina pectoris: Secondary | ICD-10-CM | POA: Diagnosis not present

## 2019-11-23 DIAGNOSIS — Z955 Presence of coronary angioplasty implant and graft: Secondary | ICD-10-CM | POA: Diagnosis not present

## 2019-11-23 DIAGNOSIS — E785 Hyperlipidemia, unspecified: Secondary | ICD-10-CM | POA: Diagnosis not present

## 2019-11-23 DIAGNOSIS — I10 Essential (primary) hypertension: Secondary | ICD-10-CM | POA: Diagnosis not present

## 2019-11-23 NOTE — Progress Notes (Signed)
Cardiac Individual Treatment Plan  Patient Details  Name: Corey Cervantes MRN: [id number] Date of Birth: Sep 11, 1940 Referring Provider:     CARDIAC REHAB PHASE II ORIENTATION from 10/01/2019 in [place]  Referring Provider Sherren Mocha, MD      Initial Encounter Date:    CARDIAC REHAB PHASE II ORIENTATION from 10/01/2019 in [place]  Date 10/01/19      Visit Diagnosis: S/P DES mLAD 07/29/19  Patient's Home Medications on Admission:  Current Outpatient Medications:  .  ASHWAGANDHA PO, Take 1 tablet by mouth daily.  (Patient not taking: Reported on 10/01/2019), Disp: , Rfl:  .  aspirin EC 81 MG tablet, Take 1 tablet (81 mg total) by mouth daily., Disp: , Rfl:  .  b complex vitamins tablet, Take 1 tablet by mouth daily., Disp: , Rfl:  .  buPROPion (WELLBUTRIN) 100 MG tablet, Take 100 mg by mouth daily., Disp: , Rfl:  .  Cholecalciferol (VITAMIN D-3) 25 MCG (1000 UT) CAPS, Take 1,000 Units by mouth daily. , Disp: , Rfl:  .  CHONDROITIN SULFATE PO, Take 2 tablets by mouth daily. , Disp: , Rfl:  .  clopidogrel (PLAVIX) 75 MG tablet, Take 1 tablet (75 mg total) by mouth daily., Disp: 90 tablet, Rfl: 3 .  co-enzyme Q-10 30 MG capsule, Take 30 mg by mouth daily., Disp: , Rfl:  .  Evolocumab (REPATHA SURECLICK) 784 MG/ML SOAJ, Inject 1 pen into the skin every 14 (fourteen) days., Disp: 2 pen, Rfl: 11 .  hydrochlorothiazide (MICROZIDE) 12.5 MG capsule, TAKE 1 CAPSULE BY MOUTH EVERY DAY (Patient taking differently: Take 12.5 mg by mouth daily. ), Disp: 90 capsule, Rfl: 2 .  metFORMIN (GLUCOPHAGE) 500 MG tablet, Take 1 tablet (500 mg total) by mouth daily with breakfast., Disp: 90 tablet, Rfl: 1 .  metoprolol succinate (TOPROL-XL) 25 MG 24 hr tablet, Take 1 tablet (25 mg total) by mouth daily. Take with or immediately following a meal., Disp: 90 tablet, Rfl: 3 .  naphazoline-pheniramine (VISINE) 0.025-0.3 % ophthalmic solution, Place  1 drop into both eyes 4 (four) times daily as needed for eye irritation. Visine, Disp: , Rfl:  .  nitroGLYCERIN (NITROSTAT) 0.4 MG SL tablet, Place 1 tablet (0.4 mg total) under the tongue every 5 (five) minutes as needed for chest pain., Disp: 25 tablet, Rfl: 1 .  vitamin E 180 MG (400 UNITS) capsule, Take 400 Units by mouth daily., Disp: , Rfl:   Current Facility-Administered Medications:  .  0.9 %  sodium chloride infusion, 500 mL, Intravenous, Continuous, Gatha Mayer, MD  Past Medical History: Past Medical History:  Diagnosis Date  . Allergy   . Arthritis   . CAD (coronary artery disease) 07/29/2019   DES to LAD  . Cancer [facility]) many years ago    melanoma  . Hx of adenomatous colonic polyps 08/30/2016  . Hyperlipidemia   . Hypertension     Tobacco Use: Social History   Tobacco Use  Smoking Status Former Smoker  . Years: 15.00  . Types: Cigarettes, Pipe, Cigars  . Quit date: 02/26/1981  . Years since quitting: 38.7  Smokeless Tobacco Never Used    Labs: Recent Chemical engineer    Labs for ITP Cardiac and Pulmonary Rehab Latest Ref Rng & Units 03/07/2018 09/01/2018 03/11/2019 08/20/2019 11/17/2019   Cholestrol 100 - 199 mg/dL 221(H) 238(H) 232(H) - 131   LDLCALC 0 - 99 mg/dL 157(H) 164(H) 156(H) - 66  HDL >39 mg/dL 47 56 53 - 51   Trlycerides 0 - 149 mg/dL 87 91 126 - 68   Hemoglobin A1c 4.8 - 5.6 % 6.4(H) 6.2(H) 6.1(H) 6.3(H) -      Capillary Blood Glucose: Lab Results  Component Value Date   GLUCAP 114 (H) 10/14/2019   GLUCAP 105 (H) 10/12/2019   GLUCAP 119 (H) 10/12/2019   GLUCAP 112 (H) 10/07/2019   GLUCAP 152 (H) 10/07/2019     Exercise Target Goals: Exercise Program Goal: Individual exercise prescription set using results from initial 6 min walk test and THRR while considering  patient's activity barriers and safety.   Exercise Prescription Goal: Initial exercise prescription builds to 30-45 minutes a day of aerobic activity, 2-3 days per week.  Home  exercise guidelines will be given to patient during program as part of exercise prescription that the participant will acknowledge.  Activity Barriers & Risk Stratification:  Activity Barriers & Cardiac Risk Stratification - 10/01/19 1152      Activity Barriers & Cardiac Risk Stratification   Activity Barriers Joint Problems;Balance Concerns   Right knee pain at times, Right shoulder   Cardiac Risk Stratification High           6 Minute Walk:  6 Minute Walk    Row Name 10/01/19 1149 11/20/19 0655       6 Minute Walk   Phase Initial Discharge    Distance 1871 feet 2055 feet    Distance % Change -- 9.83 %    Distance Feet Change -- 184 ft    Walk Time 6 minutes 6 minutes    # of Rest Breaks 0 0    MPH 2.37 3.89    METS 4.51 4.12    RPE 7 9    Perceived Dyspnea  0 0    VO2 Peak 15.79 14.42    Symptoms No No    Resting HR 63 bpm 83 bpm    Resting BP 142/78 152/80    Resting Oxygen Saturation  98 % --    Exercise Oxygen Saturation  during 6 min walk 97 % --    Max Ex. HR 97 bpm 106 bpm    Max Ex. BP 140/72 160/62    2 Minute Post BP 128/66 138/70           Oxygen Initial Assessment:   Oxygen Re-Evaluation:   Oxygen Discharge (Final Oxygen Re-Evaluation):   Initial Exercise Prescription:  Initial Exercise Prescription - 10/01/19 1100      Date of Initial Exercise RX and Referring Provider   Date 10/01/19    Referring Provider Sherren Mocha, MD    Expected Discharge Date 11/27/19      Bike   Level 2    Minutes 15    METs 3      NuStep   Level 3    SPM 85    Minutes 15    METs 2.2      Prescription Details   Frequency (times per week) 3    Duration Progress to 30 minutes of continuous aerobic without signs/symptoms of physical distress      Intensity   THRR 40-80% of Max Heartrate 57-114    Ratings of Perceived Exertion 11-13    Perceived Dyspnea 0-4      Progression   Progression Continue progressive overload as per policy without  signs/symptoms or physical distress.      Resistance Training   Training Prescription Yes    Weight 3  lbs    Reps 10-15           Perform Capillary Blood Glucose checks as needed.  Exercise Prescription Changes:   Exercise Prescription Changes    Row Name 10/05/19 0715 10/19/19 0702 11/04/19 0656 11/16/19 0658       Response to Exercise   Blood Pressure (Admit) 156/74 160/72 112/62 112/60    Blood Pressure (Exercise) 170/76 156/80 144/62 152/70    Blood Pressure (Exit) 130/76 112/70 114/62 124/70    Heart Rate (Admit) 66 bpm 67 bpm 84 bpm 71 bpm    Heart Rate (Exercise) 104 bpm 116 bpm 106 bpm 115 bpm    Heart Rate (Exit) 63 bpm 77 bpm 84 bpm 81 bpm    Rating of Perceived Exertion (Exercise) '12 12 12 12    ' Symptoms none none none none    Comments Off to a good start with exercise.  -- -- --    Duration Continue with 30 min of aerobic exercise without signs/symptoms of physical distress. Continue with 30 min of aerobic exercise without signs/symptoms of physical distress. Continue with 30 min of aerobic exercise without signs/symptoms of physical distress. Continue with 30 min of aerobic exercise without signs/symptoms of physical distress.    Intensity THRR unchanged THRR unchanged THRR unchanged THRR unchanged      Progression   Progression Continue to progress workloads to maintain intensity without signs/symptoms of physical distress. Continue to progress workloads to maintain intensity without signs/symptoms of physical distress. Continue to progress workloads to maintain intensity without signs/symptoms of physical distress. Continue to progress workloads to maintain intensity without signs/symptoms of physical distress.    Average METs 3.5 4.3 4.1 4.6      Resistance Training   Training Prescription Yes Yes No Yes    Weight 3 lbs 5lbs -- 5lbs    Reps 10-15 10-15 -- 10-15    Time 10 Minutes 10 Minutes -- 10 Minutes      Interval Training   Interval Training -- No No No       Bike   Level '2 3 4 4    ' Minutes '15 15 15 15    ' METs 3.7 5 4.5 5.5      NuStep   Level '3 3 4 4    ' SPM 85 85 85 85    Minutes '15 15 15 15    ' METs 3.4 3.7 3.8 3.7      Home Exercise Plan   Plans to continue exercise at -- Home (comment)  Walking Home (comment)  Walking Home (comment)  Walking    Frequency -- Add 4 additional days to program exercise sessions. Add 4 additional days to program exercise sessions. Add 4 additional days to program exercise sessions.    Initial Home Exercises Provided -- 10/19/19 10/19/19 10/19/19           Exercise Comments:   Exercise Comments    Row Name 10/05/19 [phone] 10/19/19 0748 10/21/19 0855 11/04/19 0715 11/18/19 0730   Exercise Comments Patient tolerated 1st session of exercise well without symptoms. Reviewed home exercise guidelines with patient. Reviewed METs with patient. Reviewed METs and goals with patient. Reviewed METs with patient.          Exercise Goals and Review:   Exercise Goals    Row Name 10/01/19 1157             Exercise Goals   Increase Physical Activity Yes       Intervention Provide advice,  education, support and counseling about physical activity/exercise needs.;Develop an individualized exercise prescription for aerobic and resistive training based on initial evaluation findings, risk stratification, comorbidities and participant's personal goals.       Expected Outcomes Short Term: Attend rehab on a regular basis to increase amount of physical activity.;Long Term: Add in home exercise to make exercise part of routine and to increase amount of physical activity.;Long Term: Exercising regularly at least 3-5 days a week.       Increase Strength and Stamina Yes       Intervention Provide advice, education, support and counseling about physical activity/exercise needs.;Develop an individualized exercise prescription for aerobic and resistive training based on initial evaluation findings, risk stratification,  comorbidities and participant's personal goals.       Expected Outcomes Short Term: Increase workloads from initial exercise prescription for resistance, speed, and METs.;Short Term: Perform resistance training exercises routinely during rehab and add in resistance training at home;Long Term: Improve cardiorespiratory fitness, muscular endurance and strength as measured by increased METs and functional capacity (6MWT)       Able to understand and use rate of perceived exertion (RPE) scale Yes       Intervention Provide education and explanation on how to use RPE scale       Expected Outcomes Short Term: Able to use RPE daily in rehab to express subjective intensity level;Long Term:  Able to use RPE to guide intensity level when exercising independently       Knowledge and understanding of Target Heart Rate Range (THRR) Yes       Intervention Provide education and explanation of THRR including how the numbers were predicted and where they are located for reference       Expected Outcomes Short Term: Able to state/look up THRR;Long Term: Able to use THRR to govern intensity when exercising independently       Able to check pulse independently Yes       Intervention Provide education and demonstration on how to check pulse in carotid and radial arteries.;Review the importance of being able to check your own pulse for safety during independent exercise       Expected Outcomes Short Term: Able to explain why pulse checking is important during independent exercise;Long Term: Able to check pulse independently and accurately       Understanding of Exercise Prescription Yes       Intervention Provide education, explanation, and written materials on patient's individual exercise prescription       Expected Outcomes Short Term: Able to explain program exercise prescription;Long Term: Able to explain home exercise prescription to exercise independently              Exercise Goals Re-Evaluation :  Exercise  Goals Re-Evaluation    Row Name 10/05/19 0808 10/19/19 0748 11/04/19 0715 11/23/19 0739       Exercise Goal Re-Evaluation   Exercise Goals Review Increase Physical Activity;Able to understand and use rate of perceived exertion (RPE) scale Increase Physical Activity;Able to understand and use rate of perceived exertion (RPE) scale;Increase Strength and Stamina;Knowledge and understanding of Target Heart Rate Range (THRR);Understanding of Exercise Prescription Increase Physical Activity;Able to understand and use rate of perceived exertion (RPE) scale;Increase Strength and Stamina;Knowledge and understanding of Target Heart Rate Range (THRR);Understanding of Exercise Prescription Increase Physical Activity;Able to understand and use rate of perceived exertion (RPE) scale;Increase Strength and Stamina;Knowledge and understanding of Target Heart Rate Range (THRR);Understanding of Exercise Prescription    Comments Patient able  to understand and use RPE scale appropriately. Reviewed home exercise guidelines with patient including endpoints, temperature precautions, target heart rate and rate of perceived exertion. Pt is walking 2.5 miles, 35-40 minutes 3-4 days/week as his mode of home exercise. Pt also has a stationary bike at home that he rides when weather doesn't permit walking outdoors. Pt has 5 lb. weights that he uses for his resistance training. Patient continues to walk 2.5 miles, 35-40 minutes 3 days/week in addition to exercise at cardiac rehab. Patient's goal is to decrease the amount of medications that he has to take and to be able to do more. 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 Patient will complete the cardiac rehab program on 11/27/19 and will continue walking 30-45 minutes 3-4 days/week and weight training 2-3 days/week.    Expected Outcomes Progress workloads as tolerated to help improve cardiorespiratory fitness. Patient will continue aerobic exercise 30-40 minutes, 6-7 days/week  to help improve strength and stamina and achieve personal health and fitness goals. 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 Patient will continue current home exercise routine upon completion of the cardiac rehab program.           Discharge Exercise Prescription (Final Exercise Prescription Changes):  Exercise Prescription Changes - 11/16/19 0658      Response to Exercise   Blood Pressure (Admit) 112/60    Blood Pressure (Exercise) 152/70    Blood Pressure (Exit) 124/70    Heart Rate (Admit) 71 bpm    Heart Rate (Exercise) 115 bpm    Heart Rate (Exit) 81 bpm    Rating of Perceived Exertion (Exercise) 12    Symptoms none    Duration Continue with 30 min of aerobic exercise without signs/symptoms of physical distress.    Intensity THRR unchanged      Progression   Progression Continue to progress workloads to maintain intensity without signs/symptoms of physical distress.    Average METs 4.6      Resistance Training   Training Prescription Yes    Weight 5lbs    Reps 10-15    Time 10 Minutes      Interval Training   Interval Training No      Bike   Level 4    Minutes 15    METs 5.5      NuStep   Level 4    SPM 85    Minutes 15    METs 3.7      Home Exercise Plan   Plans to continue exercise at Home (comment)   Walking   Frequency Add 4 additional days to program exercise sessions.    Initial Home Exercises Provided 10/19/19           Nutrition:  Target Goals: Understanding of nutrition guidelines, daily intake of sodium <153m, cholesterol <2046m calories 30% from fat and 7% or less from saturated fats, daily to have 5 or more servings of fruits and vegetables.  Biometrics:  Pre Biometrics - 10/01/19 0845      Pre Biometrics   Waist Circumference 42 inches    Hip Circumference 43.25 inches    Waist to Hip Ratio 0.97 %    Triceps Skinfold 22 mm    %  Body Fat 30.4 %    Grip Strength 38 kg    Flexibility 13.75 in    Single  Leg Stand 3.68 seconds   High fall Risk          Post Biometrics - 11/20/19 0705       Post  Biometrics   Waist Circumference 40 inches    Hip Circumference 39.25 inches    Waist to Hip Ratio 1.02 %    Triceps Skinfold 19.5 mm    Grip Strength 34.5 kg    Flexibility 9.5 in    Single Leg Stand 3.75 seconds   High fall Risk          Nutrition Therapy Plan and Nutrition Goals:  Nutrition Therapy & Goals - 11/03/19 0949      Nutrition Therapy   Diet Heart Healthy      Personal Nutrition Goals   Nutrition Goal Pt to build a healthy plate including vegetables, fruits, whole grains, and low-fat dairy products in a heart healthy meal plan.    Personal Goal #2 Pt able to name foods that affect blood glucose.    Personal Goal #3 Pt to make food choices to reduce A1C to less than 5.7 and out of prediabetes range      Intervention Plan   Intervention Prescribe, educate and counsel regarding individualized specific dietary modifications aiming towards targeted core components such as weight, hypertension, lipid management, diabetes, heart failure and other comorbidities.;Nutrition handout(s) given to patient.    Expected Outcomes Short Term Goal: A plan has been developed with personal nutrition goals set during dietitian appointment.;Long Term Goal: Adherence to prescribed nutrition plan.           Nutrition Assessments:  Nutrition Assessments - 10/29/19 1116      MEDFICTS Scores   Pre Score 36           Nutrition Goals Re-Evaluation:  Nutrition Goals Re-Evaluation    [facility] Name 11/03/19 0950 11/24/19 0850           Goals   Current Weight 195 lb 5.2 oz (88.6 kg) 195 lb 5.2 oz (88.6 kg)      Nutrition Goal Pt to build a healthy plate including vegetables, fruits, whole grains, and low-fat dairy products in a heart healthy meal plan. Pt to build a healthy plate including vegetables, fruits, whole  grains, and low-fat dairy products in a heart healthy meal plan.      Comment -- Reviewed heart healthy nutrition education        Personal Goal #2 Re-Evaluation   Personal Goal #2 Pt able to name foods that affect blood glucose. Pt able to name foods that affect blood glucose.        Personal Goal #3 Re-Evaluation   Personal Goal #3 Pt to make food choices to reduce A1C to less than 5.7 and out of prediabetes range Pt to make food choices to reduce A1C to less than 5.7 and out of prediabetes range             Nutrition Goals Re-Evaluation:  Nutrition Goals Re-Evaluation    [place] Name 11/03/19 0950 11/24/19 0850           Goals   Current Weight 195 lb 5.2 oz (88.6 kg) 195 lb 5.2 oz (88.6 kg)      Nutrition Goal Pt to build a healthy plate including vegetables, fruits, whole grains, and low-fat dairy products in a heart healthy meal plan. Pt to build a healthy plate including vegetables, fruits, whole grains, and low-fat dairy products in a heart healthy meal plan.      Comment --  Reviewed heart healthy nutrition education        Personal Goal #2 Re-Evaluation   Personal Goal #2 Pt able to name foods that affect blood glucose. Pt able to name foods that affect blood glucose.        Personal Goal #3 Re-Evaluation   Personal Goal #3 Pt to make food choices to reduce A1C to less than 5.7 and out of prediabetes range Pt to make food choices to reduce A1C to less than 5.7 and out of prediabetes range             Nutrition Goals Discharge (Final Nutrition Goals Re-Evaluation):  Nutrition Goals Re-Evaluation - 11/24/19 0850      Goals   Current Weight 195 lb 5.2 oz (88.6 kg)    Nutrition Goal Pt to build a healthy plate including vegetables, fruits, whole grains, and low-fat dairy products in a heart healthy meal plan.    Comment Reviewed heart healthy nutrition education      Personal Goal #2 Re-Evaluation   Personal Goal #2 Pt able to name foods that affect blood glucose.       Personal Goal #3 Re-Evaluation   Personal Goal #3 Pt to make food choices to reduce A1C to less than 5.7 and out of prediabetes range           Psychosocial: Target Goals: Acknowledge presence or absence of significant depression and/or stress, maximize coping skills, provide positive support system. Participant is able to verbalize types and ability to use techniques and skills needed for reducing stress and depression.  Initial Review & Psychosocial Screening:  Initial Psych Review & Screening - 10/01/19 0935      Initial Review   Current issues with Current Stress Concerns    Source of Stress Concerns Family    Comments Bion's son had a Mi with cardiac arrest on memorial day right before his hospital admission. Karmine is still concerned about his health      [place]? Yes   Neils has his wife and children for support   Comments Will offer emotional support as needed. Salvatore says his son is doing better now      Barriers   Psychosocial barriers to participate in program The patient should benefit from training in stress management and relaxation.      Screening Interventions   Interventions Encouraged to exercise    Expected Outcomes Long Term Goal: Stressors or current issues are controlled or eliminated.           Quality of Life Scores:  Quality of Life - 11/25/19 0917      Quality of Life   Select Quality of Life      Quality of Life Scores   Health/Function Pre 27.07 %    Health/Function Post 24.67 %    Health/Function % Change -8.87 %    Socioeconomic Pre 25.81 %    Socioeconomic Post 25.81 %    Socioeconomic % Change  0 %    Psych/Spiritual Pre 25.5 %    Psych/Spiritual Post 23.57 %    Psych/Spiritual % Change -7.57 %    Family Pre 28.8 %    Family Post 27.6 %    Family % Change -4.17 %    GLOBAL Pre 26.71 %    GLOBAL Post 25.13 %    GLOBAL % Change -5.92 %          Scores of 19 and below usually indicate a poorer  quality of life in these areas.  A difference of  2-3 points is a clinically meaningful difference.  A difference of 2-3 points in the total score of the Quality of Life Index has been associated with significant improvement in overall quality of life, self-image, physical symptoms, and general health in studies assessing change in quality of life.  PHQ-9: Recent Review Flowsheet Data    Depression screen [facility] 2/9 10/01/2019 08/20/2019 03/11/2019 10/09/2018 09/01/2018   Decreased Interest 0 0 0 0 0   Down, Depressed, Hopeless 0 0 0 0 0   PHQ - 2 Score 0 0 0 0 0     Interpretation of Total Score  Total Score Depression Severity:  1-4 = Minimal depression, 5-9 = Mild depression, 10-14 = Moderate depression, 15-19 = Moderately severe depression, 20-27 = Severe depression   Psychosocial Evaluation and Intervention:  Psychosocial Evaluation - 10/05/19 1250      Psychosocial Evaluation & Interventions   Interventions Encouraged to exercise with the program and follow exercise prescription;Stress management education;Relaxation education    Comments Mr. Kapur admits to being concerned over his son's health. States his son had a cardiac arrest event recently. Mr. Casasola does present to CR with a positive attitude and outlook for himself. He feels his stress secondary to his son's health is managable and no interventions are needed. Mr. Todorov enjoys painting and writing and utilizes these hobbies to manage his stress.    Expected Outcomes Mr. Rostro will continue to have a positive attitude and outlook. He will utilize his support system and openly discuss his worries regarding his son's health with his family    Continue Psychosocial Services  Follow up required by staff           Psychosocial Re-Evaluation:  Psychosocial Re-Evaluation    [place] Name 11/03/19 1626 11/23/19 1650           Psychosocial Re-Evaluation   Current issues with None Identified None Identified      Comments Mr. Spinello  previously admitted to stress related to worring about his son who recently also had a cardiac event. Mr. Quiroa states his stress has diminished. He is confident that his son will be OK as his son is also participating in another cardiac rehab program. Mr. Terrance continues to have a positive attitued and outlook. He denies stress at this time. He has a strong support system. No interventions needed at this time.      Expected Outcomes Mr. Liebler will continue to have a positive outlook and attitude towards his health and his sons health. Mr. Houdeshell will continue to have a positive outlook and attitude and utilize his support system as needed.      Interventions Encouraged to attend Cardiac Rehabilitation for the exercise Encouraged to attend Cardiac Rehabilitation for the exercise      Continue Psychosocial Services  No Follow up required No Follow up required             Psychosocial Discharge (Final Psychosocial Re-Evaluation):  Psychosocial Re-Evaluation - 11/23/19 1650      Psychosocial Re-Evaluation   Current issues with None Identified    Comments Mr. Korber continues to have a positive attitued and outlook. He denies stress at this time. He has a strong support system. No interventions needed at this time.    Expected Outcomes Mr. Kolenda will continue to have a positive outlook and attitude and utilize his support system as needed.    Interventions Encouraged to attend Cardiac  Rehabilitation for the exercise    Continue Psychosocial Services  No Follow up required           Vocational Rehabilitation: Provide vocational rehab assistance to qualifying candidates.   Vocational Rehab Evaluation & Intervention:  Vocational Rehab - 10/01/19 0935      Initial Vocational Rehab Evaluation & Intervention   Assessment shows need for Vocational Rehabilitation No   Zaiah is currently working and does not need vocational rehab at this time.          Education: Education Goals:  Education classes will be provided on a weekly basis, covering required topics. Participant will state understanding/return demonstration of topics presented.  Learning Barriers/Preferences:  Learning Barriers/Preferences - 10/01/19 1142      Learning Barriers/Preferences   Learning Barriers Sight;Hearing   Wears glasses, HOH   Learning Preferences Computer/Internet;Pictoral;Skilled Demonstration;Video           Education Topics: Count Your Pulse:  -Group instruction provided by verbal instruction, demonstration, patient participation and written materials to support subject.  Instructors address importance of being able to find your pulse and how to count your pulse when at home without a heart monitor.  Patients get hands on experience counting their pulse with staff help and individually.   Heart Attack, Angina, and Risk Factor Modification:  -Group instruction provided by verbal instruction, video, and written materials to support subject.  Instructors address signs and symptoms of angina and heart attacks.    Also discuss risk factors for heart disease and how to make changes to improve heart health risk factors.   Functional Fitness:  -Group instruction provided by verbal instruction, demonstration, patient participation, and written materials to support subject.  Instructors address safety measures for doing things around the house.  Discuss how to get up and down off the floor, how to pick things up properly, how to safely get out of a chair without assistance, and balance training.   Meditation and Mindfulness:  -Group instruction provided by verbal instruction, patient participation, and written materials to support subject.  Instructor addresses importance of mindfulness and meditation practice to help reduce stress and improve awareness.  Instructor also leads participants through a meditation exercise.    Stretching for Flexibility and Mobility:  -Group instruction  provided by verbal instruction, patient participation, and written materials to support subject.  Instructors lead participants through series of stretches that are designed to increase flexibility thus improving mobility.  These stretches are additional exercise for major muscle groups that are typically performed during regular warm up and cool down.   Hands Only CPR:  -Group verbal, video, and participation provides a basic overview of AHA guidelines for community CPR. Role-play of emergencies allow participants the opportunity to practice calling for help and chest compression technique with discussion of AED use.   Hypertension: -Group verbal and written instruction that provides a basic overview of hypertension including the most recent diagnostic guidelines, risk factor reduction with self-care instructions and medication management.    Nutrition I class: Heart Healthy Eating:  -Group instruction provided by PowerPoint slides, verbal discussion, and written materials to support subject matter. The instructor gives an explanation and review of the Therapeutic Lifestyle Changes diet recommendations, which includes a discussion on lipid goals, dietary fat, sodium, fiber, plant stanol/sterol esters, sugar, and the components of a well-balanced, healthy diet.   Nutrition II class: Lifestyle Skills:  -Group instruction provided by PowerPoint slides, verbal discussion, and written materials to support subject matter. The instructor gives an  explanation and review of label reading, grocery shopping for heart health, heart healthy recipe modifications, and ways to make healthier choices when eating out.   Diabetes Question & Answer:  -Group instruction provided by PowerPoint slides, verbal discussion, and written materials to support subject matter. The instructor gives an explanation and review of diabetes co-morbidities, pre- and post-prandial blood glucose goals, pre-exercise blood glucose  goals, signs, symptoms, and treatment of hypoglycemia and hyperglycemia, and foot care basics.   Diabetes Blitz:  -Group instruction provided by PowerPoint slides, verbal discussion, and written materials to support subject matter. The instructor gives an explanation and review of the physiology behind type 1 and type 2 diabetes, diabetes medications and rational behind using different medications, pre- and post-prandial blood glucose recommendations and Hemoglobin A1c goals, diabetes diet, and exercise including blood glucose guidelines for exercising safely.    Portion Distortion:  -Group instruction provided by PowerPoint slides, verbal discussion, written materials, and food models to support subject matter. The instructor gives an explanation of serving size versus portion size, changes in portions sizes over the last 20 years, and what consists of a serving from each food group.   Stress Management:  -Group instruction provided by verbal instruction, video, and written materials to support subject matter.  Instructors review role of stress in heart disease and how to cope with stress positively.     Exercising on Your Own:  -Group instruction provided by verbal instruction, power point, and written materials to support subject.  Instructors discuss benefits of exercise, components of exercise, frequency and intensity of exercise, and end points for exercise.  Also discuss use of nitroglycerin and activating EMS.  Review options of places to exercise outside of rehab.  Review guidelines for sex with heart disease.   Cardiac Drugs I:  -Group instruction provided by verbal instruction and written materials to support subject.  Instructor reviews cardiac drug classes: antiplatelets, anticoagulants, beta blockers, and statins.  Instructor discusses reasons, side effects, and lifestyle considerations for each drug class.   Cardiac Drugs II:  -Group instruction provided by verbal instruction  and written materials to support subject.  Instructor reviews cardiac drug classes: angiotensin converting enzyme inhibitors (ACE-I), angiotensin II receptor blockers (ARBs), nitrates, and calcium channel blockers.  Instructor discusses reasons, side effects, and lifestyle considerations for each drug class.   Anatomy and Physiology of the Circulatory System:  Group verbal and written instruction and models provide basic cardiac anatomy and physiology, with the coronary electrical and arterial systems. Review of: AMI, Angina, Valve disease, Heart Failure, Peripheral Artery Disease, Cardiac Arrhythmia, Pacemakers, and the ICD.   Other Education:  -Group or individual verbal, written, or video instructions that support the educational goals of the cardiac rehab program.   Holiday Eating Survival Tips:  -Group instruction provided by PowerPoint slides, verbal discussion, and written materials to support subject matter. The instructor gives patients tips, tricks, and techniques to help them not only survive but enjoy the holidays despite the onslaught of food that accompanies the holidays.   Knowledge Questionnaire Score:  Knowledge Questionnaire Score - 11/25/19 0916      Knowledge Questionnaire Score   Pre Score 22/24    Post Score 19/24           Core Components/Risk Factors/Patient Goals at Admission:  Personal Goals and Risk Factors at Admission - 10/01/19 1146      Core Components/Risk Factors/Patient Goals on Admission    Weight Management Yes;Weight Maintenance    Admit Weight 195 lb  5.2 oz (88.6 kg)    Expected Outcomes Short Term: Continue to assess and modify interventions until short term weight is achieved;Long Term: Adherence to nutrition and physical activity/exercise program aimed toward attainment of established weight goal;Weight Maintenance: Understanding of the daily nutrition guidelines, which includes 25-35% calories from fat, 7% or less cal from saturated fats, less  than 231m cholesterol, less than 1.5gm of sodium, & 5 or more servings of fruits and vegetables daily;Understanding recommendations for meals to include 15-35% energy as protein, 25-35% energy from fat, 35-60% energy from carbohydrates, less than 2053mof dietary cholesterol, 20-35 gm of total fiber daily;Understanding of distribution of calorie intake throughout the day with the consumption of 4-5 meals/snacks    Diabetes Yes    Intervention Provide education about signs/symptoms and action to take for hypo/hyperglycemia.;Provide education about proper nutrition, including hydration, and aerobic/resistive exercise prescription along with prescribed medications to achieve blood glucose in normal ranges: Fasting glucose 65-99 mg/dL    Expected Outcomes Short Term: Participant verbalizes understanding of the signs/symptoms and immediate care of hyper/hypoglycemia, proper foot care and importance of medication, aerobic/resistive exercise and nutrition plan for blood glucose control.;Long Term: Attainment of HbA1C < 7%.    Hypertension Yes    Intervention Provide education on lifestyle modifcations including regular physical activity/exercise, weight management, moderate sodium restriction and increased consumption of fresh fruit, vegetables, and low fat dairy, alcohol moderation, and smoking cessation.;Monitor prescription use compliance.    Expected Outcomes Short Term: Continued assessment and intervention until BP is < 140/9059mG in hypertensive participants. < 130/20m72m in hypertensive participants with diabetes, heart failure or chronic kidney disease.;Long Term: Maintenance of blood pressure at goal levels.    Lipids Yes    Intervention Provide education and support for participant on nutrition & aerobic/resistive exercise along with prescribed medications to achieve LDL <70mg36mL >40mg.10mExpected Outcomes Short Term: Participant states understanding of desired cholesterol values and is compliant  with medications prescribed. Participant is following exercise prescription and nutrition guidelines.;Long Term: Cholesterol controlled with medications as prescribed, with individualized exercise RX and with personalized nutrition plan. Value goals: LDL < 70mg, 41m> 40 mg.    Stress Yes    Intervention Offer individual and/or small group education and counseling on adjustment to heart disease, stress management and health-related lifestyle change. Teach and support self-help strategies.;Refer participants experiencing significant psychosocial distress to appropriate mental health specialists for further evaluation and treatment. When possible, include family members and significant others in education/counseling sessions.    Expected Outcomes Short Term: Participant demonstrates changes in health-related behavior, relaxation and other stress management skills, ability to obtain effective social support, and compliance with psychotropic medications if prescribed.;Long Term: Emotional wellbeing is indicated by absence of clinically significant psychosocial distress or social isolation.           Core Components/Risk Factors/Patient Goals Review:   Goals and Risk Factor Review    Row Name 10/05/19 1254 11/03/19 1633 11/23/19 1653         Core Components/Risk Factors/Patient Goals Review   Personal Goals Review Weight Management/Obesity;Lipids;Diabetes;Stress;Hypertension Weight Management/Obesity;Lipids;Diabetes;Stress;Hypertension Weight Management/Obesity;Lipids;Diabetes;Hypertension     Review Mr. Yusupov Vahleltiple CAD risk factors. He is eager to participate in CR for modification. His goals are to increase his stamina and strength and sustain an exercise program post graduation from CR. Will continue to monitor progress towards goals over the next 30 days. Mr. Lacek Roganltiple CAD risk factors. He continues to be eager to  participate in CR for modification. His goals are to increase his  stamina and strength and sustain an exercise program post graduation from CR. He is on track to meet his cardiac rehab goals. Will continue to monitor progress towards goals over the next 30 days. Mr. Madariaga has multiple CAD risk factors. He continues to be eager to participate in CR and utilize his education learned in the program to reduce his CAD risk factors. He is taking all medication as prescribed, adhering to a heart healthy low carb diet, and exercising at home.     Expected Outcomes Mr. Hisaw will continue to participate in CR for risk factor modification. Mr. Angus will continue to participate in CR for risk factor modification. Mr. Lezotte will continue to participate in CR for risk factor modification and utilize education learned to continue risk factor modifications post discharge.            Core Components/Risk Factors/Patient Goals at Discharge (Final Review):   Goals and Risk Factor Review - 11/23/19 1653      Core Components/Risk Factors/Patient Goals Review   Personal Goals Review Weight Management/Obesity;Lipids;Diabetes;Hypertension    Review Mr. Gillson has multiple CAD risk factors. He continues to be eager to participate in CR and utilize his education learned in the program to reduce his CAD risk factors. He is taking all medication as prescribed, adhering to a heart healthy low carb diet, and exercising at home.    Expected Outcomes Mr. Laskaris will continue to participate in CR for risk factor modification and utilize education learned to continue risk factor modifications post discharge.           ITP Comments:  ITP Comments    Row Name 10/01/19 0933 10/05/19 1248 11/03/19 1617 11/23/19 1645     ITP Comments Dr Fransico Him MD, Medical Director 30 day ITP review: Mr. Treiber completed his first cardiac rehab exercise session today and tolerated well. VSS. Max exertional BP 170/76 however patient states it has only been 20 min since he took his BP medications. He is  instructed to take his BP medications 60 min prior to exercise. Denied complaints. Worked to an RPE of 12. Eager to participate in CR. Will continue to monitor patient progression towards goals over the next 30 days. 30 day ITP review: Mr. Vandenberghe has completed 11 cardiac rehab exercise sessions since admission. He is tolerating workload increases however he did have to be slowed while riding the bike secondary to hypertension (192/68). BP has been controlled since this episode as patient now better understands importance of not working past an RPE of 13. Mr. Budreau has a personal goal of sustaining his exercise and increasing his stamina and strength. He is currently on track to meet his goals. Will continue to monitor progression towards goals over the next 30 days. 30 day ITP review: Mr. Byington has completed 20 cardiac rehab exercise sessions since admission. He is tolerating workload increases however remains as his max workload secondary to graduation 11/27/19. Mr. Pigeon has a personal goal of sustaining his exercise and increasing his stamina and strength. He feels he has met his goals and benefited greatly from the program.           Comments: see ITP comments

## 2019-11-25 ENCOUNTER — Other Ambulatory Visit: Payer: Self-pay

## 2019-11-25 ENCOUNTER — Encounter (HOSPITAL_COMMUNITY)
Admission: RE | Admit: 2019-11-25 | Discharge: 2019-11-25 | Disposition: A | Discharge status: Home / Self Care | Payer: Medicare Other | Source: Ambulatory Visit | Attending: Cardiovascular Disease | Admitting: Cardiovascular Disease

## 2019-11-25 DIAGNOSIS — I10 Essential (primary) hypertension: Secondary | ICD-10-CM | POA: Diagnosis not present

## 2019-11-25 DIAGNOSIS — E785 Hyperlipidemia, unspecified: Secondary | ICD-10-CM | POA: Diagnosis not present

## 2019-11-25 DIAGNOSIS — Z955 Presence of coronary angioplasty implant and graft: Secondary | ICD-10-CM

## 2019-11-25 DIAGNOSIS — Z79899 Other long term (current) drug therapy: Secondary | ICD-10-CM | POA: Diagnosis not present

## 2019-11-25 DIAGNOSIS — I2510 Atherosclerotic heart disease of native coronary artery without angina pectoris: Secondary | ICD-10-CM | POA: Diagnosis not present

## 2019-11-27 ENCOUNTER — Encounter (HOSPITAL_COMMUNITY)
Admission: RE | Admit: 2019-11-27 | Discharge: 2019-11-27 | Disposition: A | Discharge status: Home / Self Care | Payer: Medicare Other | Source: Ambulatory Visit | Attending: Cardiovascular Disease | Admitting: Cardiovascular Disease

## 2019-11-27 ENCOUNTER — Other Ambulatory Visit: Payer: Self-pay

## 2019-11-27 VITALS — BP 112/50 | HR 71 | Ht 69.75 in | Wt 193.6 lb

## 2019-11-27 DIAGNOSIS — Z955 Presence of coronary angioplasty implant and graft: Secondary | ICD-10-CM | POA: Diagnosis not present

## 2019-11-27 NOTE — Progress Notes (Signed)
Discharge Progress Report  Patient Details  Name: Corey Cervantes MRN: [id number] Date of Birth: October 22, 1940 Referring Provider:     [facility] from 10/01/2019 in [place]  Referring Provider Sherren Mocha, MD       Number of Visits: 22 of 24  Reason for Discharge:  Patient reached a stable level of exercise. Patient independent in their exercise. Patient has met program and personal goals.  Smoking History:  Social History   Tobacco Use  Smoking Status Former Smoker  . Years: 15.00  . Types: Cigarettes, Pipe, Cigars  . Quit date: 02/26/1981  . Years since quitting: 38.7  Smokeless Tobacco Never Used    Diagnosis:  S/P DES mLAD 07/29/19  ADL UCSD:   Initial Exercise Prescription:  Initial Exercise Prescription - 10/01/19 1100      Date of Initial Exercise RX and Referring Provider   Date 10/01/19    Referring Provider Sherren Mocha, MD    Expected Discharge Date 11/27/19      Bike   Level 2    Minutes 15    METs 3      NuStep   Level 3    SPM 85    Minutes 15    METs 2.2      Prescription Details   Frequency (times per week) 3    Duration Progress to 30 minutes of continuous aerobic without signs/symptoms of physical distress      Intensity   THRR 40-80% of Max Heartrate 57-114    Ratings of Perceived Exertion 11-13    Perceived Dyspnea 0-4      Progression   Progression Continue progressive overload as per policy without signs/symptoms or physical distress.      Resistance Training   Training Prescription Yes    Weight 3 lbs    Reps 10-15           Discharge Exercise Prescription (Final Exercise Prescription Changes):  Exercise Prescription Changes - 11/16/19 0658      Response to Exercise   Blood Pressure (Admit) 112/60    Blood Pressure (Exercise) 152/70    Blood Pressure (Exit) 124/70    Heart Rate (Admit) 71 bpm    Heart Rate (Exercise) 115 bpm    Heart Rate (Exit) 81 bpm      Rating of Perceived Exertion (Exercise) 12    Symptoms none    Duration Continue with 30 min of aerobic exercise without signs/symptoms of physical distress.    Intensity THRR unchanged      Progression   Progression Continue to progress workloads to maintain intensity without signs/symptoms of physical distress.    Average METs 4.6      Resistance Training   Training Prescription Yes    Weight 5lbs    Reps 10-15    Time 10 Minutes      Interval Training   Interval Training No      Bike   Level 4    Minutes 15    METs 5.5      NuStep   Level 4    SPM 85    Minutes 15    METs 3.7      Home Exercise Plan   Plans to continue exercise at Home (comment)   Walking   Frequency Add 4 additional days to program exercise sessions.    Initial Home Exercises Provided 10/19/19           Functional Capacity:  [place] Name 10/01/19 1149 11/20/19 0655       6 Minute Walk   Phase Initial Discharge    Distance 1871 feet 2055 feet    Distance % Change -- 9.83 %    Distance Feet Change -- 184 ft    Walk Time 6 minutes 6 minutes    # of Rest Breaks 0 0    MPH 2.37 3.89    METS 4.51 4.12    RPE 7 9    Perceived Dyspnea  0 0    VO2 Peak 15.79 14.42    Symptoms No No    Resting HR 63 bpm 83 bpm    Resting BP 142/78 152/80    Resting Oxygen Saturation  98 % --    Exercise Oxygen Saturation  during 6 min walk 97 % --    Max Ex. HR 97 bpm 106 bpm    Max Ex. BP 140/72 160/62    2 Minute Post BP 128/66 138/70           Psychological, QOL, Others - Outcomes: PHQ 2/9: Depression screen [facility] 2/9 10/01/2019 08/20/2019 03/11/2019 10/09/2018 09/01/2018  Decreased Interest 0 0 0 0 0  Down, Depressed, Hopeless 0 0 0 0 0  PHQ - 2 Score 0 0 0 0 0    Quality of Life:  Quality of Life - 11/25/19 0917      Quality of Life   Select Quality of Life      Quality of Life Scores   Health/Function Pre 27.07 %    Health/Function Post 24.67 %    Health/Function % Change  -8.87 %    Socioeconomic Pre 25.81 %    Socioeconomic Post 25.81 %    Socioeconomic % Change  0 %    Psych/Spiritual Pre 25.5 %    Psych/Spiritual Post 23.57 %    Psych/Spiritual % Change -7.57 %    Family Pre 28.8 %    Family Post 27.6 %    Family % Change -4.17 %    GLOBAL Pre 26.71 %    GLOBAL Post 25.13 %    GLOBAL % Change -5.92 %           Personal Goals: Goals established at orientation with interventions provided to work toward goal.  Personal Goals and Risk Factors at Admission - 10/01/19 1146      Core Components/Risk Factors/Patient Goals on Admission    Weight Management Yes;Weight Maintenance    Admit Weight 195 lb 5.2 oz (88.6 kg)    Expected Outcomes Short Term: Continue to assess and modify interventions until short term weight is achieved;Long Term: Adherence to nutrition and physical activity/exercise program aimed toward attainment of established weight goal;Weight Maintenance: Understanding of the daily nutrition guidelines, which includes 25-35% calories from fat, 7% or less cal from saturated fats, less than 233m cholesterol, less than 1.5gm of sodium, & 5 or more servings of fruits and vegetables daily;Understanding recommendations for meals to include 15-35% energy as protein, 25-35% energy from fat, 35-60% energy from carbohydrates, less than 2020mof dietary cholesterol, 20-35 gm of total fiber daily;Understanding of distribution of calorie intake throughout the day with the consumption of 4-5 meals/snacks    Diabetes Yes    Intervention Provide education about signs/symptoms and action to take for hypo/hyperglycemia.;Provide education about proper nutrition, including hydration, and aerobic/resistive exercise prescription along with prescribed medications to achieve blood glucose in normal ranges: Fasting glucose 65-99 mg/dL  Expected Outcomes Short Term: Participant verbalizes understanding of the signs/symptoms and immediate care of hyper/hypoglycemia,  proper foot care and importance of medication, aerobic/resistive exercise and nutrition plan for blood glucose control.;Long Term: Attainment of HbA1C < 7%.    Hypertension Yes    Intervention Provide education on lifestyle modifcations including regular physical activity/exercise, weight management, moderate sodium restriction and increased consumption of fresh fruit, vegetables, and low fat dairy, alcohol moderation, and smoking cessation.;Monitor prescription use compliance.    Expected Outcomes Short Term: Continued assessment and intervention until BP is < 140/85mm HG in hypertensive participants. < 130/32mm HG in hypertensive participants with diabetes, heart failure or chronic kidney disease.;Long Term: Maintenance of blood pressure at goal levels.    Lipids Yes    Intervention Provide education and support for participant on nutrition & aerobic/resistive exercise along with prescribed medications to achieve LDL '70mg'$ , HDL >$Remo'40mg'hUBaN$ .    Expected Outcomes Short Term: Participant states understanding of desired cholesterol values and is compliant with medications prescribed. Participant is following exercise prescription and nutrition guidelines.;Long Term: Cholesterol controlled with medications as prescribed, with individualized exercise RX and with personalized nutrition plan. Value goals: LDL < $Rem'70mg'lypD$ , HDL > 40 mg.    Stress Yes    Intervention Offer individual and/or small group education and counseling on adjustment to heart disease, stress management and health-related lifestyle change. Teach and support self-help strategies.;Refer participants experiencing significant psychosocial distress to appropriate mental health specialists for further evaluation and treatment. When possible, include family members and significant others in education/counseling sessions.    Expected Outcomes Short Term: Participant demonstrates changes in health-related behavior, relaxation and other stress management skills,  ability to obtain effective social support, and compliance with psychotropic medications if prescribed.;Long Term: Emotional wellbeing is indicated by absence of clinically significant psychosocial distress or social isolation.            Personal Goals Discharge:  Goals and Risk Factor Review    Row Name 10/05/19 1254 11/03/19 1633 11/23/19 1653         Core Components/Risk Factors/Patient Goals Review   Personal Goals Review Weight Management/Obesity;Lipids;Diabetes;Stress;Hypertension Weight Management/Obesity;Lipids;Diabetes;Stress;Hypertension Weight Management/Obesity;Lipids;Diabetes;Hypertension     Review Corey Cervantes has multiple CAD risk factors. He is eager to participate in CR for modification. His goals are to increase his stamina and strength and sustain an exercise program post graduation from CR. Will continue to monitor progress towards goals over the next 30 days. Corey Cervantes has multiple CAD risk factors. He continues to be eager to participate in CR for modification. His goals are to increase his stamina and strength and sustain an exercise program post graduation from CR. He is on track to meet his cardiac rehab goals. Will continue to monitor progress towards goals over the next 30 days. Corey Cervantes has multiple CAD risk factors. He continues to be eager to participate in CR and utilize his education learned in the program to reduce his CAD risk factors. He is taking all medication as prescribed, adhering to a heart healthy low carb diet, and exercising at home.     Expected Outcomes Corey Cervantes will continue to participate in CR for risk factor modification. Corey Cervantes will continue to participate in CR for risk factor modification. Corey Cervantes will continue to participate in CR for risk factor modification and utilize education learned to continue risk factor modifications post discharge.            Exercise Goals and Review:  Exercise Goals    Row  Name 10/01/19 1157              Exercise Goals   Increase Physical Activity Yes       Intervention Provide advice, education, support and counseling about physical activity/exercise needs.;Develop an individualized exercise prescription for aerobic and resistive training based on initial evaluation findings, risk stratification, comorbidities and participant's personal goals.       Expected Outcomes Short Term: Attend rehab on a regular basis to increase amount of physical activity.;Long Term: Add in home exercise to make exercise part of routine and to increase amount of physical activity.;Long Term: Exercising regularly at least 3-5 days a week.       Increase Strength and Stamina Yes       Intervention Provide advice, education, support and counseling about physical activity/exercise needs.;Develop an individualized exercise prescription for aerobic and resistive training based on initial evaluation findings, risk stratification, comorbidities and participant's personal goals.       Expected Outcomes Short Term: Increase workloads from initial exercise prescription for resistance, speed, and METs.;Short Term: Perform resistance training exercises routinely during rehab and add in resistance training at home;Long Term: Improve cardiorespiratory fitness, muscular endurance and strength as measured by increased METs and functional capacity (6MWT)       Able to understand and use rate of perceived exertion (RPE) scale Yes       Intervention Provide education and explanation on how to use RPE scale       Expected Outcomes Short Term: Able to use RPE daily in rehab to express subjective intensity level;Long Term:  Able to use RPE to guide intensity level when exercising independently       Knowledge and understanding of Target Heart Rate Range (THRR) Yes       Intervention Provide education and explanation of THRR including how the numbers were predicted and where they are located for reference       Expected Outcomes Short Term:  Able to state/look up THRR;Long Term: Able to use THRR to govern intensity when exercising independently       Able to check pulse independently Yes       Intervention Provide education and demonstration on how to check pulse in carotid and radial arteries.;Review the importance of being able to check your own pulse for safety during independent exercise       Expected Outcomes Short Term: Able to explain why pulse checking is important during independent exercise;Long Term: Able to check pulse independently and accurately       Understanding of Exercise Prescription Yes       Intervention Provide education, explanation, and written materials on patient's individual exercise prescription       Expected Outcomes Short Term: Able to explain program exercise prescription;Long Term: Able to explain home exercise prescription to exercise independently              Exercise Goals Re-Evaluation:  Exercise Goals Re-Evaluation    Row Name 10/05/19 0808 10/19/19 0748 11/04/19 0715 11/23/19 0739 11/27/19 0734     Exercise Goal Re-Evaluation   Exercise Goals Review Increase Physical Activity;Able to understand and use rate of perceived exertion (RPE) scale Increase Physical Activity;Able to understand and use rate of perceived exertion (RPE) scale;Increase Strength and Stamina;Knowledge and understanding of Target Heart Rate Range (THRR);Understanding of Exercise Prescription Increase Physical Activity;Able to understand and use rate of perceived exertion (RPE) scale;Increase Strength and Stamina;Knowledge and understanding of Target Heart Rate Range (THRR);Understanding of Exercise Prescription Increase Physical  Activity;Able to understand and use rate of perceived exertion (RPE) scale;Increase Strength and Stamina;Knowledge and understanding of Target Heart Rate Range (THRR);Understanding of Exercise Prescription Increase Physical Activity;Able to understand and use rate of perceived exertion (RPE)  scale;Increase Strength and Stamina;Knowledge and understanding of Target Heart Rate Range (THRR);Understanding of Exercise Prescription   Comments Patient able to understand and use RPE scale appropriately. Reviewed home exercise guidelines with patient including endpoints, temperature precautions, target heart rate and rate of perceived exertion. Pt is walking 2.5 miles, 35-40 minutes 3-4 days/week as his mode of home exercise. Pt also has a stationary bike at home that he rides when weather doesn't permit walking outdoors. Pt has 5 lb. weights that he uses for his resistance training. Patient continues to walk 2.5 miles, 35-40 minutes 3 days/week in addition to exercise at cardiac rehab. Patient's goal is to decrease the amount of medications that he has to take and to be able to do more. 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 Patient will complete the cardiac rehab program on 11/27/19 and will continue walking 30-45 minutes 3-4 days/week and weight training 2-3 days/week. Patient completed the cardiac rehab program and has progressed well achieving METs with exercise. Patient will continue walking 30-45 minutes 3-4 days/week and weight training 2-3 days/week.   Expected Outcomes Progress workloads as tolerated to help improve cardiorespiratory fitness. Patient will continue aerobic exercise 30-40 minutes, 6-7 days/week to help improve strength and stamina and achieve personal health and fitness goals. 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 Patient will continue current home exercise routine upon completion of the cardiac rehab program. Patient will continue walking 30-45 minutes 3-4 days/week to help maintain health and fitness gains.          Nutrition & Weight - Outcomes:  Pre Biometrics - 10/01/19 0845      Pre Biometrics   Waist Circumference 42 inches    Hip Circumference 43.25  inches    Waist to Hip Ratio 0.97 %    Triceps Skinfold 22 mm    % Body Fat 30.4 %    Grip Strength 38 kg    Flexibility 13.75 in    Single Leg Stand 3.68 seconds   High fall Risk          Post Biometrics - 11/20/19 0705       Post  Biometrics   Waist Circumference 40 inches    Hip Circumference 39.25 inches    Waist to Hip Ratio 1.02 %    Triceps Skinfold 19.5 mm    Grip Strength 34.5 kg    Flexibility 9.5 in    Single Leg Stand 3.75 seconds   High fall Risk          Nutrition:  Nutrition Therapy & Goals - 11/03/19 0949      Nutrition Therapy   Diet Heart Healthy      Personal Nutrition Goals   Nutrition Goal Pt to build a healthy plate including vegetables, fruits, whole grains, and low-fat dairy products in a heart healthy meal plan.    Personal Goal #2 Pt able to name foods that affect blood glucose.    Personal Goal #3 Pt to make food choices to reduce A1C to less than 5.7 and out of prediabetes range      Intervention Plan   Intervention Prescribe, educate  and counsel regarding individualized specific dietary modifications aiming towards targeted core components such as weight, hypertension, lipid management, diabetes, heart failure and other comorbidities.;Nutrition handout(s) given to patient.    Expected Outcomes Short Term Goal: A plan has been developed with personal nutrition goals set during dietitian appointment.;Long Term Goal: Adherence to prescribed nutrition plan.           Nutrition Discharge:  Nutrition Assessments - 10/29/19 1116      MEDFICTS Scores   Pre Score 36           Education Questionnaire Score:  Knowledge Questionnaire Score - 11/25/19 0916      Knowledge Questionnaire Score   Pre Score 22/24    Post Score 19/24           Corey Cervantes graduated from cardiac rehab 11/27/19 after completing 22 of 24 sessions. Corey Cervantes increased is met level while in the program from 3.5 to 4.3. He feels his stamina and strength have  significantly increased which is evidenced by his ability to walk an additional 184 feet in 6 min on his post program walk test. He continues to manage his risk factors by taking medications as prescribed, adhering to a cardiac diet, and exercising when not in cardiac rehab. He plans to walk outdoors as weather permits and ride his stationary bike. He may join a gym at a later date. Corey Cervantes states he has benefited from the cardiac rehab program physically and mentally. Goals reviewed with patient; copy given to patient.

## 2019-12-23 ENCOUNTER — Other Ambulatory Visit: Payer: Self-pay | Admitting: Cardiovascular Disease

## 2019-12-23 DIAGNOSIS — E785 Hyperlipidemia, unspecified: Secondary | ICD-10-CM

## 2019-12-23 MED ORDER — REPATHA SURECLICK 140 MG/ML ~~LOC~~ SOAJ: 1.0000 "pen " | SUBCUTANEOUS | 11 refills | Status: AC

## 2019-12-23 NOTE — Telephone Encounter (Signed)
I have sent Rx to pharmacy, but they will likely be unable to fill due to insurance. Patient needs to call [phone] to report the defective pens and request new ones. We can give him a sample if he needs one before he leaves town Called and LVM for pt to call back

## 2019-12-23 NOTE — Telephone Encounter (Signed)
°*  STAT* If patient is at the pharmacy, call can be transferred to refill team.   1. Which medications need to be refilled? (please list name of each medication and dose if known) Evolocumab (REPATHA SURECLICK) 206 MG/ML SOAJ   2. Which pharmacy/location (including street and city if local pharmacy) is medication to be sent to? [id number] - [place], [place] - [address]  3. Do they need a 30 day or 90 day supply?   30 day    Need early refill due to two of them misfiring/having an error. Patient going out of town Friday, needs prior to 12/25/19

## 2019-12-24 NOTE — Telephone Encounter (Signed)
Spoke with patient. Gave him the number to [employer] to call and report defective pen and get replacement. Will leave a sample at front desk for patient. Patient appreciative of the help.

## 2020-01-04 ENCOUNTER — Ambulatory Visit: Payer: Medicare Other | Admitting: Cardiovascular Disease

## 2020-01-04 ENCOUNTER — Encounter: Payer: Self-pay | Admitting: Cardiovascular Disease

## 2020-01-04 ENCOUNTER — Other Ambulatory Visit: Payer: Self-pay

## 2020-01-04 VITALS — BP 130/74 | HR 66 | Ht 71.0 in | Wt 193.2 lb

## 2020-01-04 DIAGNOSIS — E782 Mixed hyperlipidemia: Secondary | ICD-10-CM

## 2020-01-04 DIAGNOSIS — I10 Essential (primary) hypertension: Secondary | ICD-10-CM | POA: Diagnosis not present

## 2020-01-04 DIAGNOSIS — I2510 Atherosclerotic heart disease of native coronary artery without angina pectoris: Secondary | ICD-10-CM | POA: Diagnosis not present

## 2020-01-04 NOTE — Patient Instructions (Signed)

## 2020-01-04 NOTE — Progress Notes (Signed)
Cardiology Office Note:    Date:  01/04/2020   ID:  Corey, Cervantes 1941/01/01, MRN [id number]  PCP:  Wendie Agreste, MD  [facility] Cardiologist:  Sherren Mocha, MD  [place] Electrophysiologist:  None   Referring MD: Wendie Agreste, MD   Chief Complaint  Patient presents with  . Coronary Artery Disease    History of Present Illness:    Corey Cervantes is a 79 y.o. male with history of coronary artery disease, presenting for follow-up evaluation.  The patient was diagnosed with coronary artery disease by CT coronary angiography earlier this year.  He was found to have severe plaquing in the mid LAD with CT-FFR analysis demonstrating hemodynamically significant stenosis.  He underwent cardiac catheterization July 29, 2019, confirming severe stenosis of the mid LAD treated with PCI using a 3.0 x 30 mm resolute Onyx DES.  He was noted to have moderate nonobstructive stenosis of the proximal left circumflex with recommendations for medical therapy.  The right coronary artery was difficult to visualize because of severe subclavian tortuosity.  The RCA had no significant disease noted on coronary CT angiography.  The patient is here alone today.  He is doing very well.  He does have some episodic lightheadedness and feels like this might be related to low blood pressure.  He denies chest pain, chest pressure, or shortness of breath.  He has not had frank syncope.  He denies heart palpitations.  States that he is a little unsteady when he walks upstairs.  Reports at home blood pressures prior to taking his medicines usually run in the 120-130 range.  After his medications his blood pressure is often in the range of 100 to 110 mmHg.  Past Medical History:  Diagnosis Date  . Allergy   . Arthritis   . CAD (coronary artery disease) 07/29/2019   DES to LAD  . Cancer [facility]) many years ago    melanoma  . Hx of adenomatous colonic polyps 08/30/2016  . Hyperlipidemia   .  Hypertension     Past Surgical History:  Procedure Laterality Date  . APPENDECTOMY    . CARDIAC CATHETERIZATION    . COLON SURGERY    . CORONARY ANGIOGRAPHY N/A 07/29/2019   Procedure: CORONARY ANGIOGRAPHY;  Surgeon: Sherren Mocha, MD;  Location: [place] [facility];  Service: Cardiovascular;  Laterality: N/A;  . CORONARY STENT INTERVENTION N/A 07/29/2019   Procedure: CORONARY STENT INTERVENTION;  Surgeon: Sherren Mocha, MD;  Location: [place] [facility];  Service: Cardiovascular;  Laterality: N/A;  . HERNIA REPAIR    . INTRAVASCULAR PRESSURE WIRE/FFR STUDY N/A 07/29/2019   Procedure: INTRAVASCULAR PRESSURE WIRE/FFR STUDY;  Surgeon: Sherren Mocha, MD;  Location: [place] [facility];  Service: Cardiovascular;  Laterality: N/A;  . MELANOMA EXCISION  1984    Current Medications: Current Meds  Medication Sig  . ASHWAGANDHA PO Take 1 tablet by mouth daily.   Marland Kitchen aspirin EC 81 MG tablet Take 1 tablet (81 mg total) by mouth daily.  Marland Kitchen b complex vitamins tablet Take 1 tablet by mouth daily.  Marland Kitchen buPROPion (WELLBUTRIN) 100 MG tablet Take 100 mg by mouth daily.  . Cholecalciferol (VITAMIN D-3) 25 MCG (1000 UT) CAPS Take 1,000 Units by mouth daily.   . CHONDROITIN SULFATE PO Take 2 tablets by mouth daily.   . clopidogrel (PLAVIX) 75 MG tablet Take 1 tablet (75 mg total) by mouth daily.  Marland Kitchen co-enzyme Q-10 30 MG capsule Take 30 mg by mouth  daily.  . Evolocumab (REPATHA SURECLICK) 397 MG/ML SOAJ Inject 1 pen into the skin every 14 (fourteen) days.  . hydrochlorothiazide (MICROZIDE) 12.5 MG capsule TAKE 1 CAPSULE BY MOUTH EVERY DAY  . metFORMIN (GLUCOPHAGE) 500 MG tablet Take 1 tablet (500 mg total) by mouth daily with breakfast.  . metoprolol succinate (TOPROL-XL) 25 MG 24 hr tablet Take 1 tablet (25 mg total) by mouth daily. Take with or immediately following a meal.  . naphazoline-pheniramine (VISINE) 0.025-0.3 % ophthalmic solution Place 1 drop into both eyes 4 (four) times daily as needed for eye  irritation. Visine  . nitroGLYCERIN (NITROSTAT) 0.4 MG SL tablet Place 1 tablet (0.4 mg total) under the tongue every 5 (five) minutes as needed for chest pain.  . vitamin E 180 MG (400 UNITS) capsule Take 400 Units by mouth daily.   Current Facility-Administered Medications for the 01/04/20 encounter (Office Visit) with Sherren Mocha, MD  Medication  . 0.9 %  sodium chloride infusion     Allergies:   Codeine, Lipitor [atorvastatin], and Pravastatin   Social History   Socioeconomic History  . Marital status: Married    Spouse name: Not on file  . Number of children: 2  . Years of education: Not on file  . Highest education level: Some college, no degree  Occupational History  . Occupation: Art gallery manager  Tobacco Use  . Smoking status: Former Smoker    Years: 15.00    Types: Cigarettes, Pipe, Cigars    Quit date: 02/26/1981    Years since quitting: 38.8  . Smokeless tobacco: Never Used  Vaping Use  . Vaping Use: Never used  Substance and Sexual Activity  . Alcohol use: No    Comment: no use at this time per pt  . Drug use: No  . Sexual activity: Yes  Other Topics Concern  . Not on file  Social History Narrative  . Not on file   Social Determinants of Health   Financial Resource Strain:   . Difficulty of Paying Living Expenses: Not on file  Food Insecurity:   . Worried About Charity fundraiser in the Last Year: Not on file  . Ran Out of Food in the Last Year: Not on file  Transportation Needs:   . Lack of Transportation (Medical): Not on file  . Lack of Transportation (Non-Medical): Not on file  Physical Activity:   . Days of Exercise per Week: Not on file  . Minutes of Exercise per Session: Not on file  Stress:   . Feeling of Stress : Not on file  Social Connections:   . Frequency of Communication with Friends and Family: Not on file  . Frequency of Social Gatherings with Friends and Family: Not on file  . Attends Religious Services: Not on file  .  Active Member of Clubs or Organizations: Not on file  . Attends Archivist Meetings: Not on file  . Marital Status: Not on file     Family History: The patient's family history includes Dementia in his mother; Heart disease in his father; Multiple sclerosis in his sister. There is no history of Colon cancer.  ROS:   Please see the history of present illness.    All other systems reviewed and are negative.  EKGs/Labs/Other Studies Reviewed:    The following studies were reviewed today: Cardiac Cath 07-29-2019: Conclusion  1.  Hemodynamically significant severe stenosis of the mid LAD, treated successfully with PCI using a 3.0 x 30 mm resolute  Onyx DES 2.  Moderate nonobstructive stenosis of the left circumflex with negative CT-FFR on precatheterization imaging 3.  Nonobstructive stenosis of the RCA, poorly visualized by invasive angiography secondary to inability to selectively engage the vessel in the setting of severe right subclavian tortuosity.  Recommend: Same-day PCI protocol as long as no early complications arise.  Dual antiplatelet therapy with aspirin and clopidogrel for 6 months without interruption.  Aggressive risk modification.  Consider referral to lipid clinic for PCSK9 inhibitor therapy.  Diagnostic Dominance: Right Left Main  The left main is widely patent. The vessel divides into the LAD and left circumflex  Left Anterior Descending  Mid LAD lesion is 75% stenosed. The lesion is severely calcified. Tight segmental stenosis throughout the mid LAD. Hemodynamic significance is confirmed by invasive FFR with a DFR of 0.88. PCI is performed.  First Diagonal Branch  1st Diag-1 lesion is 70% stenosed. Small, diffusely diseased vessel. Not suitable for PCI.  1st Diag-2 lesion is 50% stenosed. Small vessel, lesion involving a branch point where the vessel divides into 2 small subbranches.  Left Circumflex  The circumflex is medium in caliber. The first obtuse  marginal branch is diffusely diseased without high-grade stenosis. The AV circumflex has proximal 50% stenosis.  Ost Cx to Prox Cx lesion is 50% stenosed.  First Obtuse Marginal Branch  1st Mrg lesion is 50% stenosed.  Right Coronary Artery  Vessel is large. There is mild diffuse disease throughout the vessel. The vessel is moderately calcified. Very difficult to image from the arm because of severe subclavian tortuosity. I could not get a diagnostic or guide catheter to seat into the vessel. This vessel has demonstrated no obstructive disease by CT angiography. There is not any high-grade proximal vessel stenosis by catheter angiography, but again the vessel is poorly seen.  Intervention  Mid LAD lesion  Stent  Pre-stent angioplasty was performed using a BALLOON SAPPHIRE [place] 2.5X8. Maximum pressure: 22 atm. A drug-eluting stent was successfully placed using a STENT RESOLUTE ONYX 3.0X30. Post-stent angioplasty was performed using a BALLOON SAPPHIRE [place] 3.5X15. Maximum pressure: 16 atm. A 5 French EBU 3.5 cm guide is used. Heparin is administered for anticoagulation and a therapeutic ACT is achieved. Initially, a Comet FFR wire is used to be certain that the lesion is hemodynamically significant. The wire is passed without difficulty after a therapeutic ACT is achieved and the invasive DFR is equal to 0.88 which is positive for hemodynamic significance. The lesion is predilated with a 2.5 x 15 mm balloon. The balloon would not expand over the calcified portion of the lesion even at 14 atm. There was a clear waist in the balloon at burst pressure. Therefore, a Wolverine cutting balloon is used (2.5 x 10 mm) and it is also inflated to burst pressure which is 12 atm. Multiple inflations were done. The balloon is clearly underexpanded in the lesion. A cougar wire was passed down the LAD as a buddy wire. A 2.5 x 8 mm noncompliant balloon is dilated to high pressure on multiple inflations. The lesion finally yields  at 22 atm. The lesion is then stented with a 3.0 x 30 mm resolute Onyx DES deployed at 16 atm and that stent is postdilated with a 3.5 mm noncompliant balloon to 16 atm over the proximal to mid aspect.  Post-Intervention Lesion Assessment  The intervention was successful. Pre-interventional TIMI flow is 3. Post-intervention TIMI flow is 3. No complications occurred at this lesion.  There is a 0% residual stenosis  post intervention.  Coronary Diagrams  Diagnostic Dominance: Right  Intervention     EKG:  EKG is not ordered today.    Recent Labs: 03/11/2019: TSH 2.220 07/28/2019: Hemoglobin 13.5; Platelets 208 11/17/2019: ALT 10; BUN 21; Creatinine, Ser 1.40; Potassium 4.0; Sodium 138  Recent Lipid Panel    Component Value Date/Time   CHOL 131 11/17/2019 0925   TRIG 68 11/17/2019 0925   HDL 51 11/17/2019 0925   CHOLHDL 2.6 11/17/2019 0925   CHOLHDL 4.7 11/03/2015 1005   VLDL 14 11/03/2015 1005   LDLCALC 66 11/17/2019 0925     Risk Assessment/Calculations:       Physical Exam:    VS:  BP 130/74   Pulse 66   Ht '5\' 11"'  (1.803 m)   Wt 193 lb 3.2 oz (87.6 kg)   SpO2 98%   BMI 26.95 kg/m     Wt Readings from Last 3 Encounters:  01/04/20 193 lb 3.2 oz (87.6 kg)  11/27/19 193 lb 9 oz (87.8 kg)  11/03/19 195 lb 5.2 oz (88.6 kg)     GEN:  Well nourished, well developed in no acute distress HEENT: Normal NECK: No JVD; No carotid bruits LYMPHATICS: No lymphadenopathy CARDIAC: RRR, no murmurs, rubs, gallops RESPIRATORY:  Clear to auscultation without rales, wheezing or rhonchi  ABDOMEN: Soft, non-tender, non-distended MUSCULOSKELETAL:  No edema; No deformity  SKIN: Warm and dry NEUROLOGIC:  Alert and oriented x 3 PSYCHIATRIC:  Normal affect   ASSESSMENT:    1. Coronary artery disease involving native coronary artery of native heart without angina pectoris   2. Mixed hyperlipidemia   3. Essential hypertension    PLAN:    In order of problems listed above:  1. The  patient is stable without symptoms of angina.  I personally reviewed his cardiac catheterization films from earlier this year.  He remains on dual antiplatelet therapy with aspirin and clopidogrel.  He is treated with a beta-blocker and PCSK9 inhibitor.  No changes are recommended in his medical regimen today. 2. Lipids markedly improved with use of Repatha.  Last LDL cholesterol was 66 mg/dL.  Tolerating well.  Continue current therapy.  Patient is statin intolerant. 3. Blood pressure might be overtreated.  I talked to him about stopping hydrochlorothiazide.  I would like him to stay on metoprolol succinate at his current dose of 25 mg daily.  He prefers to discuss this with Dr. Carlota Raspberry and will continue his current medicines for now.  Advised him to stay well-hydrated with drinking plenty of water.  The patient is considering relocating to the [place] area.  He requested cardiology contact there.  I will reach out to colleagues to get a recommendation.  Shared Decision Making/Informed Consent      Medication Adjustments/Labs and Tests Ordered: Current medicines are reviewed at length with the patient today.  Concerns regarding medicines are outlined above.  No orders of the defined types were placed in this encounter.  No orders of the defined types were placed in this encounter.   Patient Instructions  Medication Instructions:  Your provider recommends that you continue on your current medications as directed. Please refer to the Current Medication list given to you today.   *If you need a refill on your cardiac medications before your next appointment, please call your pharmacy*   Follow-Up: At [facility], you and your health needs are our priority.  As part of our continuing mission to provide you with exceptional heart care, we have  created designated Provider Care Teams.  These Care Teams include your primary Cardiologist (physician) and Advanced Practice  Providers (APPs -  Physician Assistants and Nurse Practitioners) who all work together to provide you with the care you need, when you need it. Your next appointment:   12 month(s) The format for your next appointment:   In Person Provider:   You may see Sherren Mocha, MD or one of the following Advanced Practice Providers on your designated Care Team:    Richardson Dopp, PA-C  Robbie Lis, [place]      Signed, Sherren Mocha, MD  01/04/2020 1:30 PM    [place]

## 2020-01-06 ENCOUNTER — Other Ambulatory Visit: Payer: Self-pay | Admitting: Family Medicine

## 2020-01-06 DIAGNOSIS — F329 Major depressive disorder, single episode, unspecified: Secondary | ICD-10-CM

## 2020-01-06 DIAGNOSIS — F341 Dysthymic disorder: Secondary | ICD-10-CM

## 2020-01-06 DIAGNOSIS — R41840 Attention and concentration deficit: Secondary | ICD-10-CM

## 2020-01-06 NOTE — Telephone Encounter (Signed)
Patient is requesting a refill of the following medications: Requested Prescriptions   Pending Prescriptions Disp Refills   buPROPion (WELLBUTRIN SR) 100 MG 12 hr tablet [Pharmacy Med Name: BUPROPION HCL SR 100 MG TABLET] 180 tablet 1    Sig: START WITH 1 TABLET BY MOUTH FOR 1 TO 2 WEEKS, CAN INCREASE TO TWICE DAILY IF NEEDED    Date of patient request: 01/06/20 Last office visit: 08/20/19 Date of last refill: Historical Provider Last refill amount: 180 + 1 Follow up time period per chart: No showed 11/19/19 & Canceled 11/20/19

## 2020-01-06 NOTE — Telephone Encounter (Signed)
Requested medication (s) are due for refill today - unknown  Requested medication (s) are on the active medication list -yes  Future visit scheduled -no  Last refill: 10/31/19  Notes to clinic:Request RF of historical medication  Requested Prescriptions  Pending Prescriptions Disp Refills   buPROPion (WELLBUTRIN SR) 100 MG 12 hr tablet [Pharmacy Med Name: BUPROPION HCL SR 100 MG TABLET] 180 tablet 1    Sig: START WITH 1 TABLET BY MOUTH FOR 1 TO 2 WEEKS, CAN INCREASE TO TWICE DAILY IF NEEDED      Psychiatry: Antidepressants - bupropion Passed - 01/06/2020 12:52 PM      Passed - Last BP in normal range    BP Readings from Last 1 Encounters:  01/04/20 130/74          Passed - Valid encounter within last 6 months    Recent Outpatient Visits           4 months ago Coronary artery disease involving native heart without angina pectoris, unspecified vessel or lesion type   [facility], Ranell Patrick, MD   10 months ago Hyperlipidemia, unspecified hyperlipidemia type   [facility], Ranell Patrick, MD   1 year ago Need for prophylactic vaccination and inoculation against influenza   [facility], Lilia Argue, MD   1 year ago Reactive depression   [facility], Ranell Patrick, MD   1 year ago Hyperlipidemia, unspecified hyperlipidemia type   [facility], Ranell Patrick, MD                  Requested Prescriptions  Pending Prescriptions Disp Refills   buPROPion (WELLBUTRIN SR) 100 MG 12 hr tablet [Pharmacy Med Name: BUPROPION HCL SR 100 MG TABLET] 180 tablet 1    Sig: START WITH 1 TABLET BY MOUTH FOR 1 TO 2 WEEKS, CAN INCREASE TO TWICE DAILY IF NEEDED      Psychiatry: Antidepressants - bupropion Passed - 01/06/2020 12:52 PM      Passed - Last BP in normal range    BP Readings from Last 1 Encounters:  01/04/20 130/74          Passed - Valid encounter within last 6 months    Recent Outpatient Visits            4 months ago Coronary artery disease involving native heart without angina pectoris, unspecified vessel or lesion type   [facility] [place], MD   10 months ago Hyperlipidemia, unspecified hyperlipidemia type   [facility], Ranell Patrick, MD   1 year ago Need for prophylactic vaccination and inoculation against influenza   [facility], Lilia Argue, MD   1 year ago Reactive depression   [facility], Ranell Patrick, MD   1 year ago Hyperlipidemia, unspecified hyperlipidemia type   [facility], Ranell Patrick, MD

## 2020-01-08 ENCOUNTER — Other Ambulatory Visit: Payer: Self-pay | Admitting: Family Medicine

## 2020-01-08 DIAGNOSIS — I10 Essential (primary) hypertension: Secondary | ICD-10-CM

## 2020-01-08 NOTE — Telephone Encounter (Signed)
Please assist in getting pt scheduled for a follow up appt.  I will refill medication for a 90 day supply until he is able to get in our office with the holidays coming up.

## 2020-01-08 NOTE — Telephone Encounter (Signed)
Requested medications are due for refill today yes  Requested medications are on the active medication list yes  Last refill 8/18  Last visit 07/2019  Future visit scheduled No, did not show for Nov appt, did call. He did see cardiologist 12/2019  Notes to clinic Failed protocol of valid visit within 6 months, no upcoming visit scheduled.

## 2020-01-11 NOTE — Telephone Encounter (Signed)
01/11/2020 - RECEIVED A REFILL REQUEST FROM [facility] ON [address] FOR HYDROCHLOROTHIAZIDE 12.5 mg CAPSULE. I HAVE CALLED PATIENT TO SCHEDULE AND OFFICE VISIT WITH DR. Carlota Raspberry ON Wednesday (02/17/2020) AT 11:00 am. I DO NOT HAVE TO ROUTE BACK TO THE CLINICAL TEAM AT THIS TIME. MEI SENT IN A 90 DAY SUPPLY AND THE PATIENT SAID HE HAD PLENTY AT THIS TIME. [place]

## 2020-02-03 ENCOUNTER — Ambulatory Visit (INDEPENDENT_AMBULATORY_CARE_PROVIDER_SITE_OTHER): Payer: Medicare Other | Admitting: Family Medicine

## 2020-02-03 VITALS — BP 130/74 | Ht 71.0 in | Wt 186.5 lb

## 2020-02-03 DIAGNOSIS — Z1211 Encounter for screening for malignant neoplasm of colon: Secondary | ICD-10-CM

## 2020-02-03 DIAGNOSIS — Z0000 Encounter for general adult medical examination without abnormal findings: Secondary | ICD-10-CM | POA: Diagnosis not present

## 2020-02-03 NOTE — Progress Notes (Signed)
Presents today for [employer] Visit   Date of last exam: 08-20-2019  Interpreter used for this visit? No  I connected with  [name] on 02/03/20 by a telephone and verified that I am speaking with the correct person using two identifiers.   I discussed the limitations of evaluation and management by telemedicine. The patient expressed understanding and agreed to proceed.  Patient location: home  Provider location: in office  I provided 20 minutes of non face - to - face time during this encounter.  Patient Care Team: Wendie Agreste, MD as PCP - General (Family Medicine) Sherren Mocha, MD as PCP - Cardiology (Cardiology) Gatha Mayer, MD as Consulting Physician (Gastroenterology)   Other items to address today:   Discussed Eye/Dental Discussed Immunizations Follow up Scheduled Dr. Carlota Raspberry 1/13 @ 10:40   Other Screening: Last screening for diabetes: 08-20-2019 Last lipid screening:11-17-2019    ADVANCE DIRECTIVES: Discussed: yes On File: no Materials Provided: no  Immunization status:  Immunization History  Administered Date(s) Administered  . Fluad Quad(high Dose 65+) 12/19/2018  . Influenza Split 12/24/2011  . Influenza, High Dose Seasonal PF 02/12/2018  . Influenza,inj,Quad PF,6+ Mos 04/20/2013, 10/18/2014, 11/03/2015, 03/11/2017  . PFIZER SARS-COV-2 Vaccination 05/01/2019, 05/27/2019  . Pneumococcal Conjugate-13 08/17/2013  . Pneumococcal Polysaccharide-23 10/18/2014  . Zoster 09/27/2014     Health Maintenance Due  Topic Date Due  . TETANUS/TDAP  Never done  . COLONOSCOPY  08/28/2019  . INFLUENZA VACCINE  09/27/2019     Functional Status Survey: Is the patient deaf or have difficulty hearing?: No Does the patient have difficulty seeing, even when wearing glasses/contacts?: No Does the patient have difficulty concentrating, remembering, or making decisions?: No Does the patient have difficulty walking or climbing  stairs?: No Does the patient have difficulty dressing or bathing?: No Does the patient have difficulty doing errands alone such as visiting a doctor's office or shopping?: No   6CIT Screen 02/03/2020 03/07/2018  What Year? 0 points 0 points  What month? 0 points 0 points  What time? 0 points 0 points  Count back from 20 0 points 0 points  Months in reverse 0 points 0 points  Repeat phrase 0 points 0 points  Total Score 0 0        Clinical Support from 02/03/2020 in [facility]  AUDIT-C Score 5       Home Environment:   No trouble climbing stairs Lives in a one story No scattered rugs no grab bars Adequate lighting/ no clutter   Patient Active Problem List   Diagnosis Date Noted  . Statin myopathy 09/15/2019  . Coronary artery disease with exertional angina ([place]) 07/29/2019  . Hx of adenomatous colonic polyps 08/30/2016  . HTN (hypertension) 05/25/2013  . Hyperlipidemia LDL goal <70 05/25/2013  . Hyperglycemia 05/25/2013     Past Medical History:  Diagnosis Date  . Allergy   . Arthritis   . CAD (coronary artery disease) 07/29/2019   DES to LAD  . Cancer [facility]) many years ago    melanoma  . Hx of adenomatous colonic polyps 08/30/2016  . Hyperlipidemia   . Hypertension      Past Surgical History:  Procedure Laterality Date  . APPENDECTOMY    . CARDIAC CATHETERIZATION    . COLON SURGERY    . CORONARY ANGIOGRAPHY N/A 07/29/2019   Procedure: CORONARY ANGIOGRAPHY;  Surgeon: Sherren Mocha, MD;  Location: [place] [facility];  Service: Cardiovascular;  Laterality: N/A;  . CORONARY STENT INTERVENTION N/A 07/29/2019   Procedure: CORONARY STENT INTERVENTION;  Surgeon: Sherren Mocha, MD;  Location: [place] [facility];  Service: Cardiovascular;  Laterality: N/A;  . HERNIA REPAIR    . INTRAVASCULAR PRESSURE WIRE/FFR STUDY N/A 07/29/2019   Procedure: INTRAVASCULAR PRESSURE WIRE/FFR STUDY;  Surgeon: Sherren Mocha, MD;  Location: [place] [facility];  Service:  Cardiovascular;  Laterality: N/A;  . MELANOMA EXCISION  1984     Family History  Problem Relation Age of Onset  . Dementia Mother   . Heart disease Father   . Multiple sclerosis Sister   . Colon cancer Neg Hx      Social History   Socioeconomic History  . Marital status: Married    Spouse name: Not on file  . Number of children: 2  . Years of education: Not on file  . Highest education level: Some college, no degree  Occupational History  . Occupation: Art gallery manager  Tobacco Use  . Smoking status: Former Smoker    Years: 15.00    Types: Cigarettes, Pipe, Cigars    Quit date: 02/26/1981    Years since quitting: 38.9  . Smokeless tobacco: Never Used  Vaping Use  . Vaping Use: Never used  Substance and Sexual Activity  . Alcohol use: No    Comment: no use at this time per pt  . Drug use: No  . Sexual activity: Yes  Other Topics Concern  . Not on file  Social History Narrative  . Not on file   Social Determinants of Health   Financial Resource Strain:   . Difficulty of Paying Living Expenses: Not on file  Food Insecurity:   . Worried About Charity fundraiser in the Last Year: Not on file  . Ran Out of Food in the Last Year: Not on file  Transportation Needs:   . Lack of Transportation (Medical): Not on file  . Lack of Transportation (Non-Medical): Not on file  Physical Activity:   . Days of Exercise per Week: Not on file  . Minutes of Exercise per Session: Not on file  Stress:   . Feeling of Stress : Not on file  Social Connections:   . Frequency of Communication with Friends and Family: Not on file  . Frequency of Social Gatherings with Friends and Family: Not on file  . Attends Religious Services: Not on file  . Active Member of Clubs or Organizations: Not on file  . Attends Archivist Meetings: Not on file  . Marital Status: Not on file  Intimate Partner Violence:   . Fear of Current or Ex-Partner: Not on file  . Emotionally Abused: Not  on file  . Physically Abused: Not on file  . Sexually Abused: Not on file     Allergies  Allergen Reactions  . Codeine Other (See Comments)    NIGHTMARES  . Lipitor [Atorvastatin] Other (See Comments)    Malaise, myalgia.   . Pravastatin Other (See Comments)    Myalgia, malaise.      Prior to Admission medications   Medication Sig Start Date End Date Taking? Authorizing Provider  ASHWAGANDHA PO Take 1 tablet by mouth daily.    Yes [provider]  aspirin EC 81 MG tablet Take 1 tablet (81 mg total) by mouth daily. 05/07/13  Yes Sherren Mocha, MD  b complex vitamins tablet Take 1 tablet by mouth daily.   Yes [provider]  buPROPion [facility] SR) 100  MG 12 hr tablet Take 1 tablet (100 mg total) by mouth daily. 01/06/20  Yes Wendie Agreste, MD  Cholecalciferol (VITAMIN D-3) 25 MCG (1000 UT) CAPS Take 1,000 Units by mouth daily.    Yes [provider]  CHONDROITIN SULFATE PO Take 2 tablets by mouth daily.    Yes [provider]  clopidogrel (PLAVIX) 75 MG tablet Take 1 tablet (75 mg total) by mouth daily. 07/13/19 07/07/20 Yes Sherren Mocha, MD  co-enzyme Q-10 30 MG capsule Take 30 mg by mouth daily.   Yes [provider]  Evolocumab (REPATHA SURECLICK) 631 MG/ML SOAJ Inject 1 pen into the skin every 14 (fourteen) days. 12/23/19  Yes Sherren Mocha, MD  hydrochlorothiazide (MICROZIDE) 12.5 MG capsule TAKE 1 CAPSULE BY MOUTH EVERY DAY 01/08/20  Yes Wendie Agreste, MD  metFORMIN (GLUCOPHAGE) 500 MG tablet Take 1 tablet (500 mg total) by mouth daily with breakfast. 09/05/19  Yes Wendie Agreste, MD  metoprolol succinate (TOPROL-XL) 25 MG 24 hr tablet Take 1 tablet (25 mg total) by mouth daily. Take with or immediately following a meal. 07/13/19 07/07/20 Yes Sherren Mocha, MD  naphazoline-pheniramine (VISINE) 0.025-0.3 % ophthalmic solution Place 1 drop into both eyes 4 (four) times daily as needed for eye irritation. Visine   Yes  [provider]  nitroGLYCERIN (NITROSTAT) 0.4 MG SL tablet Place 1 tablet (0.4 mg total) under the tongue every 5 (five) minutes as needed for chest pain. 07/13/19  Yes Sherren Mocha, MD  vitamin E 180 MG (400 UNITS) capsule Take 400 Units by mouth daily.   Yes [provider]  buPROPion (WELLBUTRIN SR) 100 MG 12 hr tablet Take 1 tablet (100 mg total) by mouth 2 (two) times daily. Start with 1 tab QD for first week or two, then can increase to BID if needed. 09/01/18   Wendie Agreste, MD     Depression screen [facility] 2/9 02/03/2020 10/01/2019 08/20/2019 03/11/2019 10/09/2018  Decreased Interest 0 0 0 0 0  Down, Depressed, Hopeless 0 0 0 0 0  PHQ - 2 Score 0 0 0 0 0     Fall Risk  02/03/2020 10/01/2019 08/20/2019 03/11/2019 10/09/2018  Falls in the past year? 1 0 0 0 0  Number falls in past yr: 0 0 - 0 0  Injury with Fall? 0 0 - 0 0  Comment sprayed furniture polish and slipped and fell on floor no injury - - - -  Risk for fall due to : - Other (Comment);Impaired balance/gait - - -  Risk for fall due to: Comment - Balance test score of 3.68 seconds - - -  Follow up Falls evaluation completed;Education provided Falls evaluation completed Falls evaluation completed - Falls evaluation completed      PHYSICAL EXAM: BP 130/74 Comment: taken from a previous visit/ not in clinic  Ht 5\' 11"  (1.803 m)   Wt 186 lb 8 oz (84.6 kg) Comment: per patient  BMI 26.01 kg/m    Wt Readings from Last 3 Encounters:  02/03/20 186 lb 8 oz (84.6 kg)  01/04/20 193 lb 3.2 oz (87.6 kg)  11/27/19 193 lb 9 oz (87.8 kg)       Education/Counseling provided regarding diet and exercise, prevention of chronic diseases, smoking/tobacco cessation, if applicable, and reviewed "Covered Medicare Preventive Services."

## 2020-02-03 NOTE — Patient Instructions (Addendum)
Thank you for taking time to come for your Medicare Wellness Visit. I appreciate your ongoing commitment to your health goals. Please review the following plan we discussed and let me know if I can assist you in the future.  Jeannelle Wiens LPN Preventive Care 78 Years and Older, Male Preventive care refers to lifestyle choices and visits with your health care provider that can promote health and wellness. This includes:  A yearly physical exam. This is also called an annual well check.  Regular dental and eye exams.  Immunizations.  Screening for certain conditions.  Healthy lifestyle choices, such as diet and exercise. What can I expect for my preventive care visit? Physical exam Your health care provider will check:  Height and weight. These may be used to calculate body mass index (BMI), which is a measurement that tells if you are at a healthy weight.  Heart rate and blood pressure.  Your skin for abnormal spots. Counseling Your health care provider may ask you questions about:  Alcohol, tobacco, and drug use.  Emotional well-being.  Home and relationship well-being.  Sexual activity.  Eating habits.  History of falls.  Memory and ability to understand (cognition).  Work and work environment. What immunizations do I need?  Influenza (flu) vaccine  This is recommended every year. Tetanus, diphtheria, and pertussis (Tdap) vaccine  You may need a Td booster every 10 years. Varicella (chickenpox) vaccine  You may need this vaccine if you have not already been vaccinated. Zoster (shingles) vaccine  You may need this after age 78. Pneumococcal conjugate (PCV13) vaccine  One dose is recommended after age 78. Pneumococcal polysaccharide (PPSV23) vaccine  One dose is recommended after age 78. Measles, mumps, and rubella (MMR) vaccine  You may need at least one dose of MMR if you were born in 1957 or later. You may also need a second dose. Meningococcal  conjugate (MenACWY) vaccine  You may need this if you have certain conditions. Hepatitis A vaccine  You may need this if you have certain conditions or if you travel or work in places where you may be exposed to hepatitis A. Hepatitis B vaccine  You may need this if you have certain conditions or if you travel or work in places where you may be exposed to hepatitis B. Haemophilus influenzae type b (Hib) vaccine  You may need this if you have certain conditions. You may receive vaccines as individual doses or as more than one vaccine together in one shot (combination vaccines). Talk with your health care provider about the risks and benefits of combination vaccines. What tests do I need? Blood tests  Lipid and cholesterol levels. These may be checked every 5 years, or more frequently depending on your overall health.  Hepatitis C test.  Hepatitis B test. Screening  Lung cancer screening. You may have this screening every year starting at age 55 if you have a 30-pack-year history of smoking and currently smoke or have quit within the past 15 years.  Colorectal cancer screening. All adults should have this screening starting at age 50 and continuing until age 75. Your health care provider may recommend screening at age 45 if you are at increased risk. You will have tests every 1-10 years, depending on your results and the type of screening test.  Prostate cancer screening. Recommendations will vary depending on your family history and other risks.  Diabetes screening. This is done by checking your blood sugar (glucose) after you have not eaten for a   a while (fasting). You may have this done every 1-3 years.  Abdominal aortic aneurysm (AAA) screening. You may need this if you are a current or former smoker.  Sexually transmitted disease (STD) testing. Follow these instructions at home: Eating and drinking  Eat a diet that includes fresh fruits and vegetables, whole grains, lean  protein, and low-fat dairy products. Limit your intake of foods with high amounts of sugar, saturated fats, and salt.  Take vitamin and mineral supplements as recommended by your health care provider.  Do not drink alcohol if your health care provider tells you not to drink.  If you drink alcohol: ? Limit how much you have to 0-2 drinks a day. ? Be aware of how much alcohol is in your drink. In the [place] one drink equals one 12 oz bottle of beer (355 mL), one 5 oz glass of wine (148 mL), or one 1 oz glass of hard liquor (44 mL). Lifestyle  Take daily care of your teeth and gums.  Stay active. Exercise for at least 30 minutes on 5 or more days each week.  Do not use any products that contain nicotine or tobacco, such as cigarettes, e-cigarettes, and chewing tobacco. If you need help quitting, ask your health care provider.  If you are sexually active, practice safe sex. Use a condom or other form of protection to prevent STIs (sexually transmitted infections).  Talk with your health care provider about taking a low-dose aspirin or statin. What's next?  Visit your health care provider once a year for a well check visit.  Ask your health care provider how often you should have your eyes and teeth checked.  Stay up to date on all vaccines. This information is not intended to replace advice given to you by your health care provider. Make sure you discuss any questions you have with your health care provider. Document Revised: 02/06/2018 Document Reviewed: 02/06/2018 Elsevier Patient Education  2020 [employer]  

## 2020-02-17 ENCOUNTER — Ambulatory Visit: Payer: Medicare Other | Admitting: Family Medicine

## 2020-02-24 ENCOUNTER — Ambulatory Visit: Payer: Medicare Other | Admitting: Family Medicine

## 2020-02-26 ENCOUNTER — Other Ambulatory Visit: Payer: Self-pay | Admitting: Family Medicine

## 2020-03-04 ENCOUNTER — Encounter: Payer: Self-pay | Admitting: Internal Medicine

## 2020-03-10 ENCOUNTER — Ambulatory Visit (INDEPENDENT_AMBULATORY_CARE_PROVIDER_SITE_OTHER): Payer: Medicare Other | Admitting: Family Medicine

## 2020-03-10 ENCOUNTER — Other Ambulatory Visit: Payer: Self-pay

## 2020-03-10 VITALS — BP 118/76 | HR 67 | Temp 98.1°F | Resp 15 | Ht 71.0 in | Wt 189.4 lb

## 2020-03-10 DIAGNOSIS — Z23 Encounter for immunization: Secondary | ICD-10-CM | POA: Diagnosis not present

## 2020-03-10 DIAGNOSIS — E785 Hyperlipidemia, unspecified: Secondary | ICD-10-CM | POA: Diagnosis not present

## 2020-03-10 DIAGNOSIS — N1831 Chronic kidney disease, stage 3a: Secondary | ICD-10-CM | POA: Diagnosis not present

## 2020-03-10 DIAGNOSIS — F341 Dysthymic disorder: Secondary | ICD-10-CM

## 2020-03-10 DIAGNOSIS — F329 Major depressive disorder, single episode, unspecified: Secondary | ICD-10-CM

## 2020-03-10 DIAGNOSIS — R7303 Prediabetes: Secondary | ICD-10-CM | POA: Diagnosis not present

## 2020-03-10 DIAGNOSIS — I2510 Atherosclerotic heart disease of native coronary artery without angina pectoris: Secondary | ICD-10-CM

## 2020-03-10 DIAGNOSIS — R42 Dizziness and giddiness: Secondary | ICD-10-CM

## 2020-03-10 DIAGNOSIS — R41840 Attention and concentration deficit: Secondary | ICD-10-CM

## 2020-03-10 DIAGNOSIS — I10 Essential (primary) hypertension: Secondary | ICD-10-CM

## 2020-03-10 MED ORDER — METFORMIN HCL 500 MG PO TABS: ORAL_TABLET | ORAL | 1 refills | Status: DC

## 2020-03-10 MED ORDER — BUPROPION HCL ER (SR) 100 MG PO TB12: 100.0000 mg | ORAL_TABLET | Freq: Every day | ORAL | 1 refills | Status: DC

## 2020-03-10 MED ORDER — HYDROCHLOROTHIAZIDE 12.5 MG PO TABS: 12.5000 mg | ORAL_TABLET | Freq: Every day | ORAL | 1 refills | Status: DC

## 2020-03-10 NOTE — Progress Notes (Unsigned)
Subjective:  Patient ID: Corey Cervantes, male    DOB: 11-25-1940  Age: 80 y.o. MRN: [id number]  CC:  Chief Complaint  Patient presents with  . Hypertension    Pt reports doing well avg 118/70 pt reports no physical symptoms, no side effects at this time     HPI TRAMEL MCCLISH presents for   Hypertension: With CAD, followed by Dr. Burt Knack with cardiology, office visit November 8.  Cardiac cath June 2021 with stenting.  Continued on dual antiplatelet therapy with aspirin and clopidogrel.  Continue beta-blocker and PCSK9 inhibitor.  Question of overtreatment of hypertension, recommended stopping HCTZ and continuing metoprolol but pt wanted to discuss this with me.  Maintenance of hydration discussed.  Still has slight feeling off balance if turning suddenly.  No headaches, no focal weakness. Has come off hctz for few days- felt sharp pain in chest for a flash of a second - brief and and has not returned. 92month ago, quickly resolved. No CP with exertion.  Drinking some water, but maybe not enough - trying to increase. Feels better with 4-5 glasses water per day.  Home readings: 118-121/78.  Alcohol -  whiskey or glass of wine 1 per day for awhile, now less - few per week.   BP Readings from Last 3 Encounters:  03/10/20 118/76  02/03/20 130/74  01/04/20 130/74   Lab Results  Component Value Date   CREATININE 1.40 (H) 11/17/2019   Hyperlipidemia: Improved on PCSK9 inhibitor, Repatha. Doing well on this med. Did not tolerate statins.  Lab Results  Component Value Date   CHOL 131 11/17/2019   HDL 51 11/17/2019   LDLCALC 66 11/17/2019   TRIG 68 11/17/2019   CHOLHDL 2.6 11/17/2019   Lab Results  Component Value Date   ALT 10 11/17/2019   AST 17 11/17/2019   ALKPHOS 47 11/17/2019   BILITOT 0.5 11/17/2019    Prediabetes  Wt Readings from Last 3 Encounters:  03/10/20 189 lb 6.4 oz (85.9 kg)  02/03/20 186 lb 8 oz (84.6 kg)  01/04/20 193 lb 3.2 oz (87.6 kg)   Lab Results   Component Value Date   HGBA1C 6.1 (H) 03/10/2020    CKD: Creatinine range 1.23-1.36 over the past 2 years.  Most recent creatinine slightly higher at 1.4 in September. [id number]  Fluid intake as above.  No nsaids.   Depression: Some stress with son. Son recovering from heart attack,  Considering moving to [place] area.  wellbutrin once per day helping - no changes requested.   Depression screen [facility] 2/9 03/10/2020 02/03/2020 10/01/2019 08/20/2019 03/11/2019  Decreased Interest 0 0 0 0 0  Down, Depressed, Hopeless 0 0 0 0 0  PHQ - 2 Score 0 0 0 0 0    Prediabetes: Metformin 500mg  qd. No new side effects.    Lab Results  Component Value Date   HGBA1C 6.1 (H) 03/10/2020   HGBA1C 6.3 (H) 08/20/2019   HGBA1C 6.1 (H) 03/11/2019   Lab Results  Component Value Date   LDLCALC 66 11/17/2019   CREATININE 1.40 (H) 11/17/2019   Hx of BPH, treated by Dr. Alinda Money in past.  No recent visit. Plans to schedule visit - some occasional urinary dribbling.    History Patient Active Problem List   Diagnosis Date Noted  . Statin myopathy 09/15/2019  . Coronary artery disease with exertional angina ([place]) 07/29/2019  . Hx of adenomatous colonic polyps 08/30/2016  . HTN (hypertension) 05/25/2013  . Hyperlipidemia LDL goal <  70 05/25/2013  . Hyperglycemia 05/25/2013   Past Medical History:  Diagnosis Date  . Allergy   . Arthritis   . CAD (coronary artery disease) 07/29/2019   DES to LAD  . Cancer [facility]) many years ago    melanoma  . Hx of adenomatous colonic polyps 08/30/2016  . Hyperlipidemia   . Hypertension    Past Surgical History:  Procedure Laterality Date  . APPENDECTOMY    . CARDIAC CATHETERIZATION    . COLON SURGERY    . CORONARY ANGIOGRAPHY N/A 07/29/2019   Procedure: CORONARY ANGIOGRAPHY;  Surgeon: Sherren Mocha, MD;  Location: [place] [facility];  Service: Cardiovascular;  Laterality: N/A;  . CORONARY STENT INTERVENTION N/A 07/29/2019   Procedure: CORONARY STENT INTERVENTION;   Surgeon: Sherren Mocha, MD;  Location: [place] [facility];  Service: Cardiovascular;  Laterality: N/A;  . HERNIA REPAIR    . INTRAVASCULAR PRESSURE WIRE/FFR STUDY N/A 07/29/2019   Procedure: INTRAVASCULAR PRESSURE WIRE/FFR STUDY;  Surgeon: Sherren Mocha, MD;  Location: [place] [facility];  Service: Cardiovascular;  Laterality: N/A;  . MELANOMA EXCISION  1984   Allergies  Allergen Reactions  . Codeine Other (See Comments)    NIGHTMARES  . Lipitor [Atorvastatin] Other (See Comments)    Malaise, myalgia.   . Pravastatin Other (See Comments)    Myalgia, malaise.    Prior to Admission medications   Medication Sig Start Date End Date Taking? Authorizing Provider  ASHWAGANDHA PO Take 1 tablet by mouth daily.    Yes [provider]  aspirin EC 81 MG tablet Take 1 tablet (81 mg total) by mouth daily. 05/07/13  Yes Sherren Mocha, MD  b complex vitamins tablet Take 1 tablet by mouth daily.   Yes [provider]  buPROPion (WELLBUTRIN SR) 100 MG 12 hr tablet Take 1 tablet (100 mg total) by mouth daily. 01/06/20  Yes Wendie Agreste, MD  Cholecalciferol (VITAMIN D-3) 25 MCG (1000 UT) CAPS Take 1,000 Units by mouth daily.    Yes [provider]  CHONDROITIN SULFATE PO Take 2 tablets by mouth daily.    Yes [provider]  clopidogrel (PLAVIX) 75 MG tablet Take 1 tablet (75 mg total) by mouth daily. 07/13/19 07/07/20 Yes Sherren Mocha, MD  co-enzyme Q-10 30 MG capsule Take 30 mg by mouth daily.   Yes [provider]  Evolocumab (REPATHA SURECLICK) [id number] MG/ML SOAJ Inject 1 pen into the skin every 14 (fourteen) days. 12/23/19  Yes Sherren Mocha, MD  hydrochlorothiazide (MICROZIDE) 12.5 MG capsule TAKE 1 CAPSULE BY MOUTH EVERY DAY 01/08/20  Yes Wendie Agreste, MD  metFORMIN (GLUCOPHAGE) 500 MG tablet TAKE 1 TABLET BY MOUTH EVERY DAY WITH BREAKFAST 02/26/20  Yes Wendie Agreste, MD  metoprolol succinate (TOPROL-XL) 25 MG 24 hr tablet Take 1 tablet (25  mg total) by mouth daily. Take with or immediately following a meal. 07/13/19 07/07/20 Yes Sherren Mocha, MD  naphazoline-pheniramine (VISINE) 0.025-0.3 % ophthalmic solution Place 1 drop into both eyes 4 (four) times daily as needed for eye irritation. Visine   Yes [provider]  nitroGLYCERIN (NITROSTAT) 0.4 MG SL tablet Place 1 tablet (0.4 mg total) under the tongue every 5 (five) minutes as needed for chest pain. 07/13/19  Yes Sherren Mocha, MD  vitamin E 180 MG (400 UNITS) capsule Take 400 Units by mouth daily.   Yes [provider]   Social History   Socioeconomic History  . Marital status: Married    Spouse name:  Not on file  . Number of children: 2  . Years of education: Not on file  . Highest education level: Some college, no degree  Occupational History  . Occupation: Art gallery manager  Tobacco Use  . Smoking status: Former Smoker    Years: 15.00    Types: Cigarettes, Pipe, Cigars    Quit date: 02/26/1981    Years since quitting: 39.0  . Smokeless tobacco: Never Used  Vaping Use  . Vaping Use: Never used  Substance and Sexual Activity  . Alcohol use: No    Comment: no use at this time per pt  . Drug use: No  . Sexual activity: Yes  Other Topics Concern  . Not on file  Social History Narrative  . Not on file   Social Determinants of Health   Financial Resource Strain: Not on file  Food Insecurity: Not on file  Transportation Needs: Not on file  Physical Activity: Not on file  Stress: Not on file  Social Connections: Not on file  Intimate Partner Violence: Not on file    Review of Systems  Constitutional: Negative for fatigue and unexpected weight change.  Eyes: Negative for visual disturbance.  Respiratory: Negative for cough, chest tightness and shortness of breath.   Cardiovascular: Negative for chest pain, palpitations and leg swelling.  Gastrointestinal: Negative for abdominal pain and blood in stool.  Genitourinary: Positive for  difficulty urinating (occasional dribbling. ). Negative for dysuria, flank pain and hematuria.  Neurological: Negative for dizziness, light-headedness and headaches.    Objective:   Vitals:   03/10/20 1141 03/10/20 1146  BP: (!) 156/79 118/76  Pulse: 67   Resp: 15   Temp: 98.1 F (36.7 C)   TempSrc: Temporal   SpO2: 100%   Weight: 189 lb 6.4 oz (85.9 kg)   Height: 5\' 11"  (1.803 m)      Physical Exam Vitals reviewed.  Constitutional:      Appearance: He is well-developed and well-nourished.  HENT:     Head: Normocephalic and atraumatic.  Eyes:     Extraocular Movements: EOM normal.     Pupils: Pupils are equal, round, and reactive to light.  Neck:     Vascular: No carotid bruit or JVD.  Cardiovascular:     Rate and Rhythm: Normal rate and regular rhythm.     Heart sounds: Normal heart sounds. No murmur heard.   Pulmonary:     Effort: Pulmonary effort is normal.     Breath sounds: Normal breath sounds. No rales.  Musculoskeletal:        General: No edema.  Skin:    General: Skin is warm and dry.  Neurological:     Mental Status: He is alert and oriented to person, place, and time.  Psychiatric:        Mood and Affect: Mood and affect and mood normal.        Behavior: Behavior normal.        Assessment & Plan:  JHOVANY WEIDINGER is a 80 y.o. male . Essential hypertension - Plan: hydrochlorothiazide (HYDRODIURIL) 12.5 MG tablet Stage 3a chronic kidney disease ([place]) Episode of dizziness  -Possible overcontrolled blood pressure as contributor.  Decrease HCTZ to 1/2 tablet/day, maintenance of hydration discussed.  RTC precautions if not improving.  Hyperlipidemia, unspecified hyperlipidemia type  -Tolerating Repatha.  Coronary artery disease involving native heart without angina pectoris, unspecified vessel or lesion type  -Continue to follow-up with cardiology.  Asymptomatic  Prediabetes - Plan: Hemoglobin A1c, metFORMIN (  GLUCOPHAGE) 500 MG  tablet  -Continue watch diet/exercise.  Continue metformin for now, check A1c.  Flu vaccine need - Plan: Flu vaccine HIGH DOSE PF (Fluzone High dose)  Difficulty concentrating - Plan: buPROPion (WELLBUTRIN SR) 100 MG 12 hr tablet Dysthymia - Plan: buPROPion (WELLBUTRIN SR) 100 MG 12 hr tablet Reactive depression - Plan: buPROPion (WELLBUTRIN SR) 100 MG 12 hr tablet  -Some situational stressors but declined medication changes at this time.  Continue Wellbutrin same dose.  RTC precautions.  Meds ordered this encounter  Medications  . hydrochlorothiazide (HYDRODIURIL) 12.5 MG tablet    Sig: Take 1 tablet (12.5 mg total) by mouth daily.    Dispense:  45 tablet    Refill:  1  . metFORMIN (GLUCOPHAGE) 500 MG tablet    Sig: TAKE 1 TABLET BY MOUTH EVERY DAY WITH BREAKFAST    Dispense:  90 tablet    Refill:  1  . buPROPion (WELLBUTRIN SR) 100 MG 12 hr tablet    Sig: Take 1 tablet (100 mg total) by mouth daily.    Dispense:  90 tablet    Refill:  1   Patient Instructions   Lower blood pressure may be a cause of balance. Try decreasing HCTZ to 1/2 tab per day to see if that lessens balance symptoms. Try to increase water intake as well.  If balance symptoms are not improving with this change, schedule office visit to look into other causes.  No other med changes at this time.  If you are feeling more stress, or depression symptoms please follow-up so we can discuss this further.  Please call Dr. Alinda Money to discuss the urinary symptoms as that may be related to the enlarged prostate.  If medication is started, I would watch blood pressure closely as some of those medications can affect your blood pressure or potentially make you lightheaded.  Return to the clinic or go to the nearest emergency room if any of your symptoms worsen or new symptoms occur.    If you have lab work done today you will be contacted with your lab results within the next 2 weeks.  If you have not heard from [place] then  please contact us. The fastest way to get your results is to register for My Chart.   IF you received an x-ray today, you will receive an invoice from [facility]. Please contact [facility] at [phone] with questions or concerns regarding your invoice.   IF you received labwork today, you will receive an invoice from [facility]. Please contact [facility] at [phone] with questions or concerns regarding your invoice.   Our billing staff will not be able to assist you with questions regarding bills from these companies.  You will be contacted with the lab results as soon as they are available. The fastest way to get your results is to activate your My Chart account. Instructions are located on the last page of this paperwork. If you have not heard from [place] regarding the results in 2 weeks, please contact this office.         Signed, Merri Ray, MD [facility]

## 2020-03-10 NOTE — Patient Instructions (Addendum)
Lower blood pressure may be a cause of balance. Try decreasing HCTZ to 1/2 tab per day to see if that lessens balance symptoms. Try to increase water intake as well.  If balance symptoms are not improving with this change, schedule office visit to look into other causes.  No other med changes at this time.  If you are feeling more stress, or depression symptoms please follow-up so we can discuss this further.  Please call Dr. Alinda Money to discuss the urinary symptoms as that may be related to the enlarged prostate.  If medication is started, I would watch blood pressure closely as some of those medications can affect your blood pressure or potentially make you lightheaded.  Return to the clinic or go to the nearest emergency room if any of your symptoms worsen or new symptoms occur.    If you have lab work done today you will be contacted with your lab results within the next 2 weeks.  If you have not heard from [place] then please contact us. The fastest way to get your results is to register for My Chart.   IF you received an x-ray today, you will receive an invoice from [facility]. Please contact [facility] at [phone] with questions or concerns regarding your invoice.   IF you received labwork today, you will receive an invoice from [place]. Please contact [facility] at [phone] with questions or concerns regarding your invoice.   Our billing staff will not be able to assist you with questions regarding bills from these companies.  You will be contacted with the lab results as soon as they are available. The fastest way to get your results is to activate your My Chart account. Instructions are located on the last page of this paperwork. If you have not heard from [place] regarding the results in 2 weeks, please contact this office.

## 2020-03-11 LAB — HEMOGLOBIN A1C
Est. average glucose Bld gHb Est-mCnc: 128 mg/dL
Hgb A1c MFr Bld: 6.1 % — ABNORMAL HIGH (ref 4.8–5.6)

## 2020-03-12 ENCOUNTER — Encounter: Payer: Self-pay | Admitting: Family Medicine

## 2020-04-11 ENCOUNTER — Ambulatory Visit: Payer: Medicare Other | Admitting: Internal Medicine

## 2020-04-11 ENCOUNTER — Encounter: Payer: Self-pay | Admitting: Internal Medicine

## 2020-04-11 ENCOUNTER — Other Ambulatory Visit: Payer: Self-pay

## 2020-04-11 VITALS — BP 120/86 | HR 67 | Ht 71.0 in | Wt 191.6 lb

## 2020-04-11 DIAGNOSIS — I2510 Atherosclerotic heart disease of native coronary artery without angina pectoris: Secondary | ICD-10-CM

## 2020-04-11 DIAGNOSIS — F439 Reaction to severe stress, unspecified: Secondary | ICD-10-CM

## 2020-04-11 DIAGNOSIS — Z86010 Personal history of colonic polyps: Secondary | ICD-10-CM

## 2020-04-11 DIAGNOSIS — Z7902 Long term (current) use of antithrombotics/antiplatelets: Secondary | ICD-10-CM

## 2020-04-11 NOTE — Patient Instructions (Signed)
We will put you in the system for a colonoscopy recall for 06/2020.   Normal BMI (Body Mass Index- based on height and weight) is between 23 and 30. Your BMI today is Body mass index is 26.72 kg/m. Marland Kitchen Please consider follow up  regarding your BMI with your Primary Care Provider.   Due to recent changes in healthcare laws, you may see the results of your imaging and laboratory studies on MyChart before your provider has had a chance to review them.  We understand that in some cases there may be results that are confusing or concerning to you. Not all laboratory results come back in the same time frame and the provider may be waiting for multiple results in order to interpret others.  Please give [place] 48 hours in order for your provider to thoroughly review all the results before contacting the office for clarification of your results.    I appreciate the opportunity to care for you. Silvano Rusk, MD, [facility]

## 2020-04-11 NOTE — Progress Notes (Signed)
Corey Cervantes 80 y.o. 1940/03/02 [id number]  Assessment & Plan:   Encounter Diagnoses  Name Primary?  Marland Kitchen Hx of adenomatous colonic polyps Yes  . Coronary artery disease involving native coronary artery of native heart without angina pectoris   . Encounter for long-term use of antiplatelets/antithrombotics - clopidogrel   . Situational stress-son with health problems     It is reasonable and appropriate to perform another surveillance colonoscopy in this patient in my opinion.  He agrees.  In general we wait a year before elective holding of clopidogrel after coronary artery disease stenting.  So sometime in June or July will make sense for him.  We will place a May recall and I will contact him at that time.  He could have a nursing previsit either by video and we will request cardiology input on holding clopidogrel 5 days prior.  Dr. Burt Knack is his cardiologist. I have reviewed risks benefits and indications of the procedure with the patient.  He understands the rare but real risk of myocardial infarction while withholding clopidogrel.  We will mitigate that somewhat by continuing aspirin.  Other usual endoscopy risks reviewed with the patient and he understands and agrees to proceed, notably bleeding, perforation and injury requiring surgery, infection.  He has reviewed the informed consent piece.   I appreciate the opportunity to care for this patient. CC: Corey Agreste, MD      Subjective:   Chief Complaint: History of colon polyps  HPI Corey Cervantes is a 80 year old white man with a history of coronary artery disease status post drug-eluting stent placement to the LAD in June 2021 by Dr. Sherren Mocha, who also has a history of adenomatous colonic polyps and a 3-year recall after July 2018 colonoscopy was placed.  He is here now without active GI symptoms but interested in pursuing a surveillance colonoscopy.  In 2018 he had 2 adenomas maximum 12 mm.  Previous colonoscopy 2009  with tubulovillous adenoma with a focal high-grade glandular dysplasia another tubulovillous adenoma and multiple adenomas and some right-sided hyperplastic polyps and he had a diminutive ascending and a 12 mm splenic flexure polyp on a close follow-up surveillance exam because one of his polyps earlier that year was 3 cm.  Those were an adenoma and a tubulovillous adenoma. Allergies  Allergen Reactions  . Codeine Other (See Comments)    NIGHTMARES  . Lipitor [Atorvastatin] Other (See Comments)    Malaise, myalgia.   . Pravastatin Other (See Comments)    Myalgia, malaise.    Current Meds  Medication Sig  . ASHWAGANDHA PO Take 1 tablet by mouth as needed.  Marland Kitchen aspirin EC 81 MG tablet Take 1 tablet (81 mg total) by mouth daily.  Marland Kitchen b complex vitamins tablet Take 1 tablet by mouth daily.  Marland Kitchen buPROPion (WELLBUTRIN SR) 100 MG 12 hr tablet Take 1 tablet (100 mg total) by mouth daily.  . Cholecalciferol (VITAMIN D-3) 25 MCG (1000 UT) CAPS Take 1,000 Units by mouth daily.   . CHONDROITIN SULFATE PO Take 2 tablets by mouth daily.   . clopidogrel (PLAVIX) 75 MG tablet Take 1 tablet (75 mg total) by mouth daily.  Marland Kitchen co-enzyme Q-10 30 MG capsule Take 30 mg by mouth daily.  . Evolocumab (REPATHA SURECLICK) 628 MG/ML SOAJ Inject 1 pen into the skin every 14 (fourteen) days.  . hydrochlorothiazide (HYDRODIURIL) 12.5 MG tablet Take 1 tablet (12.5 mg total) by mouth daily.  . metFORMIN (GLUCOPHAGE) 500 MG tablet TAKE 1 TABLET BY  MOUTH EVERY DAY WITH BREAKFAST  . metoprolol succinate (TOPROL-XL) 25 MG 24 hr tablet Take 1 tablet (25 mg total) by mouth daily. Take with or immediately following a meal.  . naphazoline-pheniramine (VISINE) 0.025-0.3 % ophthalmic solution Place 1 drop into both eyes 4 (four) times daily as needed for eye irritation. Visine  . nitroGLYCERIN (NITROSTAT) 0.4 MG SL tablet Place 1 tablet (0.4 mg total) under the tongue every 5 (five) minutes as needed for chest pain.  . vitamin E 180 MG  (400 UNITS) capsule Take 400 Units by mouth daily.   Past Medical History:  Diagnosis Date  . Allergy   . Arthritis   . CAD (coronary artery disease) 07/29/2019   DES to LAD  . Cancer [facility]) many years ago    melanoma  . Hx of adenomatous colonic polyps 08/30/2016  . Hyperlipidemia   . Hypertension    Past Surgical History:  Procedure Laterality Date  . APPENDECTOMY    . CARDIAC CATHETERIZATION    . COLON SURGERY    . CORONARY ANGIOGRAPHY N/A 07/29/2019   Procedure: CORONARY ANGIOGRAPHY;  Surgeon: Sherren Mocha, MD;  Location: [place] [facility];  Service: Cardiovascular;  Laterality: N/A;  . CORONARY STENT INTERVENTION N/A 07/29/2019   Procedure: CORONARY STENT INTERVENTION;  Surgeon: Sherren Mocha, MD;  Location: [place] [facility];  Service: Cardiovascular;  Laterality: N/A;  . HERNIA REPAIR    . INTRAVASCULAR PRESSURE WIRE/FFR STUDY N/A 07/29/2019   Procedure: INTRAVASCULAR PRESSURE WIRE/FFR STUDY;  Surgeon: Sherren Mocha, MD;  Location: [place] [facility];  Service: Cardiovascular;  Laterality: N/A;  . 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   Review of Systems See HPI.  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  Decreased hearing some urine leakage at times depressed mood and anxious mood at times.  All other review of systems are negative.  Objective:   Physical Exam BP 120/86   Pulse 67   Ht 5\' 11"  (1.803 m)   Wt 191 lb 9.6 oz  (86.9 kg)   SpO2 98%   BMI 26.72 kg/m   Well-developed well-nourished white man in no acute distress looking somewhat younger than stated age  Eyes are anicteric Lung fields are clear anteriorly and posteriorly  Heart sounds are normal S1-S2 no rubs murmurs or gallops  Abdomen is soft nontender without organomegaly or mass bowel sounds are present  He is alert and oriented x3 and has an appropriate mood and affect  Rectal exam is deferred until colonoscopy

## 2020-04-15 ENCOUNTER — Other Ambulatory Visit: Payer: Self-pay | Admitting: Cardiovascular Disease

## 2020-06-11 ENCOUNTER — Other Ambulatory Visit: Payer: Self-pay | Admitting: Family Medicine

## 2020-06-11 DIAGNOSIS — I10 Essential (primary) hypertension: Secondary | ICD-10-CM

## 2020-06-11 NOTE — Telephone Encounter (Signed)
   Notes to clinic Not a provider we approve rx for.  

## 2020-07-07 ENCOUNTER — Other Ambulatory Visit: Payer: Self-pay | Admitting: Cardiovascular Disease

## 2020-07-07 ENCOUNTER — Other Ambulatory Visit: Payer: Self-pay | Admitting: Family Medicine

## 2020-07-07 DIAGNOSIS — I10 Essential (primary) hypertension: Secondary | ICD-10-CM

## 2020-09-07 ENCOUNTER — Ambulatory Visit: Payer: Medicare Other | Admitting: Family Medicine

## 2020-10-28 ENCOUNTER — Other Ambulatory Visit: Payer: Self-pay | Admitting: Family Medicine

## 2020-10-28 DIAGNOSIS — R41840 Attention and concentration deficit: Secondary | ICD-10-CM

## 2020-10-28 DIAGNOSIS — F341 Dysthymic disorder: Secondary | ICD-10-CM

## 2020-10-28 DIAGNOSIS — F329 Major depressive disorder, single episode, unspecified: Secondary | ICD-10-CM

## 2020-12-05 ENCOUNTER — Other Ambulatory Visit: Payer: Self-pay

## 2020-12-05 ENCOUNTER — Telehealth: Payer: Self-pay

## 2020-12-05 ENCOUNTER — Ambulatory Visit (INDEPENDENT_AMBULATORY_CARE_PROVIDER_SITE_OTHER): Payer: Medicare Other | Admitting: Family Medicine

## 2020-12-05 ENCOUNTER — Ambulatory Visit: Payer: Medicare Other | Admitting: Family Medicine

## 2020-12-05 VITALS — BP 138/66 | HR 67 | Temp 98.3°F | Resp 16 | Ht 71.0 in | Wt 192.8 lb

## 2020-12-05 DIAGNOSIS — R7303 Prediabetes: Secondary | ICD-10-CM | POA: Diagnosis not present

## 2020-12-05 DIAGNOSIS — F341 Dysthymic disorder: Secondary | ICD-10-CM

## 2020-12-05 DIAGNOSIS — E785 Hyperlipidemia, unspecified: Secondary | ICD-10-CM | POA: Diagnosis not present

## 2020-12-05 DIAGNOSIS — Z23 Encounter for immunization: Secondary | ICD-10-CM

## 2020-12-05 DIAGNOSIS — F329 Major depressive disorder, single episode, unspecified: Secondary | ICD-10-CM

## 2020-12-05 DIAGNOSIS — I10 Essential (primary) hypertension: Secondary | ICD-10-CM

## 2020-12-05 DIAGNOSIS — R41840 Attention and concentration deficit: Secondary | ICD-10-CM

## 2020-12-05 LAB — COMPREHENSIVE METABOLIC PANEL
ALT: 11 U/L (ref 0–53)
AST: 18 U/L (ref 0–37)
Albumin: 4 g/dL (ref 3.5–5.2)
Alkaline Phosphatase: 51 U/L (ref 39–117)
BUN: 20 mg/dL (ref 6–23)
CO2: 28 mEq/L (ref 19–32)
Calcium: 9.2 mg/dL (ref 8.4–10.5)
Chloride: 105 mEq/L (ref 96–112)
Creatinine, Ser: 1.22 mg/dL (ref 0.40–1.50)
GFR: 56.58 mL/min — ABNORMAL LOW (ref >60.00)
Glucose, Bld: 112 mg/dL — ABNORMAL HIGH (ref 70–99)
Potassium: 4.1 mEq/L (ref 3.5–5.1)
Sodium: 139 mEq/L (ref 135–145)
Total Bilirubin: 0.9 mg/dL (ref 0.2–1.2)
Total Protein: 6.5 g/dL (ref 6.0–8.3)

## 2020-12-05 LAB — LIPID PANEL
Cholesterol: 194 mg/dL (ref 0–200)
HDL: 54.3 mg/dL (ref >39.00)
LDL Cholesterol: 127 mg/dL — ABNORMAL HIGH (ref 0–99)
NonHDL: 140.16
Total CHOL/HDL Ratio: 4
Triglycerides: 66 mg/dL (ref 0.0–149.0)
VLDL: 13.2 mg/dL (ref 0.0–40.0)

## 2020-12-05 LAB — HEMOGLOBIN A1C: Hgb A1c MFr Bld: 6.4 % (ref 4.6–6.5)

## 2020-12-05 MED ORDER — HYDROCHLOROTHIAZIDE 12.5 MG PO TABS: 12.5000 mg | ORAL_TABLET | Freq: Every day | ORAL | 1 refills | Status: AC

## 2020-12-05 MED ORDER — BUPROPION HCL ER (SR) 100 MG PO TB12: 100.0000 mg | ORAL_TABLET | Freq: Every day | ORAL | 1 refills | Status: AC

## 2020-12-05 NOTE — Patient Instructions (Addendum)
Please let me know where I can send your records. It has been a pleasure being able to care for you. Please keep in touch.  One option for cardiology is Dr. Jeanelle Malling with [facility]. Let me know if I can help with any referrals or med refills in the meantime.  I will check some labs.  Depending on A1c, can discuss restarting metformin if needed.  Call cardiology to restart Repatha. [facility] is now offering the bivalent Covid booster vaccine. To schedule an appointment or to find more information, visit [email or web address] You can also call [phone], Monday through Friday, 7 a.m. to 7 p.m.

## 2020-12-05 NOTE — Telephone Encounter (Signed)
-----   Message from Gatha Mayer, MD sent at 12/02/2020  5:50 PM EDT ----- Regarding: needs OV not urgent We were waiting for a year after stent to do a colon  Let's get him an OV not urgent to arrange colonoscopy if he is still interested

## 2020-12-05 NOTE — Progress Notes (Signed)
Subjective:  Patient ID: Corey Cervantes, male    DOB: [birth year]  Age: 80 y.o. MRN: [id number]  CC:  Chief Complaint  Patient presents with   Medication Refill    Pt here due to move, he is here to ensure he has refills until he can see new PCP    Diabetes    Pt reports stopped metformin and instability has improved since that time    Hyperlipidemia    Pt reports stopped statin in July     HPI [name] presents for   Med refill/review.  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 Next few weeks. Will be establishing with new primary care provider in [place] area.    Prediabetes: Treated with metformin 500 mg daily in past - came off for past few months, concern about that med and possible side effects/risks.  Exercise:usual walking, less past few months as busy.  Sugar containing beverages, fast food - avoiding.  Alcohol - small amount of whisky or wine - about 10 total per week.  Feels well.  Plans for increasing exercise, walking in new area.   Lab Results  Component Value Date   HGBA1C 6.1 (H) 03/10/2020   Wt Readings from Last 3 Encounters:  12/05/20 192 lb 12.8 oz (87.5 kg)  04/11/20 191 lb 9.6 oz (86.9 kg)  03/10/20 189 lb 6.4 oz (85.9 kg)    Reactive Depression/dysthymia: Doing well on Wellbutrin 100mg  QD.Working well. Wants to stay at same dose - doing ok with recent stressors.  Depression screen [facility] 2/9 12/05/2020 03/10/2020 02/03/2020 10/01/2019 08/20/2019  Decreased Interest 0 0 0 0 0  Down, Depressed, Hopeless 1 0 0 0 0  PHQ - 2 Score 1 0 0 0 0  Altered sleeping 1 - - - -  Tired, decreased energy 1 - - - -  Change in appetite 0 - - - -  Feeling bad or failure about yourself  1 - - - -  Trouble concentrating 2 - - - -  Moving slowly or fidgety/restless 0 - - - -  Suicidal thoughts 1 - - - -  PHQ-9 Score 7 - - - -     Hypertension: With CKD, CAD.  Cardiologist Dr. Burt Knack.  Cardiac cath June 2021  with stents, antiplatelet therapy with aspirin, Plavix, beta-blocker, PCSK9 Repatha for hyperlipidemia (prior stain myopathy).  Possible overtreatment of hypertension previously, discontinued HCTZ. Has been off [place] since July - busy with life.  Has been taking toprol and HCTZ full doses. No recent dizziness, low blood pressures.  Home readings: 118-170's/60-70.  No new med side effects.  BP Readings from Last 3 Encounters:  12/05/20 138/66  04/11/20 120/86  03/10/20 118/76   Lab Results  Component Value Date   CREATININE 1.40 (H) 11/17/2019   BPH: Urology Dr. Alinda Money  No new meds - doing well - few nocturia, not consistent, overall stable.   Health maintenance: Shingles vaccine - has received through pharmacy - 1 of 2 - recommended updated vaccine.  Colonoscopy - scheduled with GI - may need to change.Dr. Carlean Purl.  Covid vaccine booster - recommended.  High dose flu vaccine today.     History Patient Active Problem List   Diagnosis Date Noted   Statin myopathy 09/15/2019   Coronary artery disease with exertional angina ([place]) 07/29/2019   Hx of adenomatous colonic polyps 08/30/2016   HTN (hypertension) 05/25/2013   Hyperlipidemia LDL goal <  70 05/25/2013   Hyperglycemia 05/25/2013   Past Medical History:  Diagnosis Date   Allergy    Arthritis    CAD (coronary artery disease) 07/29/2019   DES to LAD   Cancer [facility]) many years ago    melanoma   Hx of adenomatous colonic polyps 08/30/2016   Hyperlipidemia    Hypertension    Past Surgical History:  Procedure Laterality Date   APPENDECTOMY     CARDIAC CATHETERIZATION     COLON SURGERY     CORONARY ANGIOGRAPHY N/A 07/29/2019   Procedure: CORONARY ANGIOGRAPHY;  Surgeon: Sherren Mocha, MD;  Location: [place] [facility];  Service: Cardiovascular;  Laterality: N/A;   CORONARY STENT INTERVENTION N/A 07/29/2019   Procedure: CORONARY STENT INTERVENTION;  Surgeon: Sherren Mocha, MD;  Location: [facility];  Service:  Cardiovascular;  Laterality: N/A;   HERNIA REPAIR     INTRAVASCULAR PRESSURE WIRE/FFR STUDY N/A 07/29/2019   Procedure: INTRAVASCULAR PRESSURE WIRE/FFR STUDY;  Surgeon: Sherren Mocha, MD;  Location: [place];  Service: Cardiovascular;  Laterality: N/A;   MELANOMA EXCISION  1984   Allergies  Allergen Reactions   Codeine Other (See Comments)    NIGHTMARES   Lipitor [Atorvastatin] Other (See Comments)    Malaise, myalgia.    Pravastatin Other (See Comments)    Myalgia, malaise.    Prior to Admission medications   Medication Sig Start Date End Date Taking? Authorizing Provider  ASHWAGANDHA PO Take 1 tablet by mouth as needed.   Yes [provider]  aspirin EC 81 MG tablet Take 1 tablet (81 mg total) by mouth daily. 05/07/13  Yes Sherren Mocha, MD  b complex vitamins tablet Take 1 tablet by mouth daily.   Yes [provider]  buPROPion ER [facility] SR) 100 MG 12 hr tablet TAKE 1 TABLET BY MOUTH EVERY DAY 10/28/20  Yes Wendie Agreste, MD  Cholecalciferol (VITAMIN D-3) 25 MCG (1000 UT) CAPS Take 1,000 Units by mouth daily.    Yes [provider]  CHONDROITIN SULFATE PO Take 2 tablets by mouth daily.    Yes [provider]  clopidogrel (PLAVIX) 75 MG tablet TAKE 1 TABLET BY MOUTH EVERY DAY 07/07/20  Yes Sherren Mocha, MD  co-enzyme Q-10 30 MG capsule Take 30 mg by mouth daily.   Yes [provider]  hydrochlorothiazide (HYDRODIURIL) 12.5 MG tablet TAKE 1 TABLET BY MOUTH EVERY DAY 06/20/20  Yes Wendie Agreste, MD  metoprolol succinate (TOPROL-XL) 25 MG 24 hr tablet TAKE 1 TABLET (25 MG TOTAL) BY MOUTH DAILY. TAKE WITH OR IMMEDIATELY FOLLOWING A MEAL. 04/15/20 04/10/21 Yes Sherren Mocha, MD  naphazoline-pheniramine (VISINE) 0.025-0.3 % ophthalmic solution Place 1 drop into both eyes 4 (four) times daily as needed for eye irritation. Visine   Yes [provider]  nitroGLYCERIN (NITROSTAT) 0.4 MG SL tablet Place 1 tablet (0.4 mg  total) under the tongue every 5 (five) minutes as needed for chest pain. 07/13/19  Yes Sherren Mocha, MD  vitamin E 180 MG (400 UNITS) capsule Take 400 Units by mouth daily.   Yes [provider]  Evolocumab (REPATHA SURECLICK) 277 MG/ML SOAJ Inject 1 pen into the skin every 14 (fourteen) days. 12/23/19   Sherren Mocha, MD  metFORMIN (GLUCOPHAGE) 500 MG tablet TAKE 1 TABLET BY MOUTH EVERY DAY WITH BREAKFAST 03/10/20   Wendie Agreste, MD   Social History   Socioeconomic History   Marital status: Married    Spouse name: Not on file  Number of children: 2   Years of education: Not on file   Highest education level: Some college, no degree  Occupational History   Occupation: Art gallery manager  Tobacco Use   Smoking status: Former    Years: 15.00    Types: Cigarettes, Pipe, Cigars    Quit date: 02/26/1981    Years since quitting: 39.8   Smokeless tobacco: Never  Vaping Use   Vaping Use: Never used  Substance and Sexual Activity   Alcohol use: Yes    Alcohol/week: 8.0 - 10.0 standard drinks    Types: 8 - 10 Standard drinks or equivalent per week   Drug use: No   Sexual activity: Yes  Other Topics Concern   Not on file  Social History Narrative   Married has 2 sons they both live in [place]   1 son had MI with neurologic deficits in June 2021 and is requiring some assistance, patient may move down there and help him run his business      Former smoker 1 or 2 alcoholic drinks in a day but not every day 3 caffeinated drinks a day no tobacco or drug use   Social Determinants of Radio broadcast assistant Strain: Not on file  Food Insecurity: Not on file  Transportation Needs: Not on file  Physical Activity: Not on file  Stress: Not on file  Social Connections: Not on file  Intimate Partner Violence: Not on file    Review of Systems  Constitutional:  Negative for fatigue and unexpected weight change.  Respiratory:  Negative for cough, chest tightness and  shortness of breath.   Cardiovascular:  Negative for chest pain, palpitations and leg swelling.  Gastrointestinal:  Negative for abdominal pain and blood in stool.  Neurological:  Negative for dizziness, light-headedness and headaches.    Objective:   Vitals:   12/05/20 1032  BP: 138/66  Pulse: 67  Resp: 16  Temp: 98.3 F (36.8 C)  TempSrc: Temporal  SpO2: 98%  Weight: 192 lb 12.8 oz (87.5 kg)  Height: 5\' 11"  (1.803 m)   Physical Exam Vitals reviewed.  Constitutional:      Appearance: He is well-developed.  HENT:     Head: Normocephalic and atraumatic.  Neck:     Vascular: No carotid bruit or JVD.  Cardiovascular:     Rate and Rhythm: Normal rate and regular rhythm.     Heart sounds: Normal heart sounds. No murmur heard. Pulmonary:     Effort: Pulmonary effort is normal.     Breath sounds: Normal breath sounds. No rales.  Musculoskeletal:     Right lower leg: No edema.     Left lower leg: No edema.  Skin:    General: Skin is warm and dry.  Neurological:     Mental Status: He is alert and oriented to person, place, and time.  Psychiatric:        Mood and Affect: Mood normal.    Assessment & Plan:  AMARRION PASTORINO is a 80 y.o. male . Hyperlipidemia, unspecified hyperlipidemia type - Plan: Lipid panel  -Intolerant to statins as above.  Follow-up with cardiology for PCSK9 inhibitor, will also need to establish with new cardiologist in [place].  Options provided.  Prediabetes - Plan: Comprehensive metabolic panel, Hemoglobin A1c, Lipid panel  -Off metformin, check A1c.  Diet/exercise/lifestyle modification discussed.  Difficulty concentrating - Plan: buPROPion ER (WELLBUTRIN SR) 100 MG 12 hr tablet Dysthymia - Plan: buPROPion ER (WELLBUTRIN SR) 100 MG 12 hr  tablet Reactive depression - Plan: buPROPion ER (WELLBUTRIN SR) 100 MG 12 hr tablet  -Stable with current dose Wellbutrin, continue same  Essential hypertension - Plan: hydrochlorothiazide (HYDRODIURIL) 12.5  MG tablet  -  Stable, tolerating current regimen. Medications refilled. Labs pending as above. Plan to establish with new cardiologist after moving, referral can be placed if needed.  Need for influenza vaccination - Plan: Flu Vaccine QUAD High Dose(Fluad)   Meds ordered this encounter  Medications   buPROPion ER (WELLBUTRIN SR) 100 MG 12 hr tablet    Sig: Take 1 tablet (100 mg total) by mouth daily.    Dispense:  90 tablet    Refill:  1   hydrochlorothiazide (HYDRODIURIL) 12.5 MG tablet    Sig: Take 1 tablet (12.5 mg total) by mouth daily.    Dispense:  90 tablet    Refill:  1   Patient Instructions  Please let me know where I can send your records.  One option for cardiology is Dr. Jeanelle Malling with [facility]. Let me know if I can help with any referrals or med refills in the meantime.  I will check some labs.  Depending on A1c, can discuss restarting metformin if needed.  Call cardiology to restart Repatha. [place] is now offering the bivalent Covid booster vaccine. To schedule an appointment or to find more information, visit [email or web address] You can also call [phone], Monday through Friday, 7 a.m. to 7 p.m.   Signed,   Merri Ray, MD [place], [facility] 12/05/20 11:22 AM

## 2020-12-05 NOTE — Telephone Encounter (Signed)
I spoke with Corey Cervantes and in 4 days he is moving to [place] to be with his son. He said he is going to try and find a GI Doctor there. He said thank you Dr Carlean Purl for your care , that your high on his list.

## 2020-12-06 ENCOUNTER — Encounter: Payer: Self-pay | Admitting: Family Medicine

## 2021-08-02 ENCOUNTER — Telehealth: Payer: Self-pay | Admitting: Family Medicine

## 2021-08-02 NOTE — Telephone Encounter (Signed)
I was researching to call patient to schedule AWV.  According to phone note dated 12/05/20 patient moved to [place], [place] to be with his son. Please remove PCP.

## 2021-08-02 NOTE — Telephone Encounter (Signed)
Opened in errorl

## 2022-03-08 ENCOUNTER — Other Ambulatory Visit (HOSPITAL_COMMUNITY): Payer: Self-pay
